# Patient Record
Sex: Female | Born: 1935
Health system: Southern US, Community
[De-identification: ages and names within clinical notes are randomized; demographics above are authoritative.]

## PROBLEM LIST (undated history)

## (undated) DIAGNOSIS — J309 Allergic rhinitis, unspecified: Secondary | ICD-10-CM

## (undated) DIAGNOSIS — R112 Nausea with vomiting, unspecified: Secondary | ICD-10-CM

## (undated) DIAGNOSIS — M8580 Other specified disorders of bone density and structure, unspecified site: Secondary | ICD-10-CM

## (undated) DIAGNOSIS — N390 Urinary tract infection, site not specified: Secondary | ICD-10-CM

## (undated) DIAGNOSIS — T7840XA Allergy, unspecified, initial encounter: Secondary | ICD-10-CM

## (undated) DIAGNOSIS — H269 Unspecified cataract: Secondary | ICD-10-CM

## (undated) DIAGNOSIS — D472 Monoclonal gammopathy: Secondary | ICD-10-CM

## (undated) DIAGNOSIS — R509 Fever, unspecified: Secondary | ICD-10-CM

## (undated) DIAGNOSIS — F419 Anxiety disorder, unspecified: Secondary | ICD-10-CM

## (undated) DIAGNOSIS — M51369 Other intervertebral disc degeneration, lumbar region without mention of lumbar back pain or lower extremity pain: Secondary | ICD-10-CM

## (undated) DIAGNOSIS — F331 Major depressive disorder, recurrent, moderate: Secondary | ICD-10-CM

## (undated) DIAGNOSIS — R296 Repeated falls: Secondary | ICD-10-CM

## (undated) DIAGNOSIS — C801 Malignant (primary) neoplasm, unspecified: Secondary | ICD-10-CM

## (undated) DIAGNOSIS — R197 Diarrhea, unspecified: Secondary | ICD-10-CM

## (undated) DIAGNOSIS — N3000 Acute cystitis without hematuria: Secondary | ICD-10-CM

## (undated) DIAGNOSIS — G2581 Restless legs syndrome: Secondary | ICD-10-CM

## (undated) DIAGNOSIS — Z87440 Personal history of urinary (tract) infections: Secondary | ICD-10-CM

## (undated) DIAGNOSIS — F0390 Unspecified dementia without behavioral disturbance: Secondary | ICD-10-CM

## (undated) DIAGNOSIS — E1129 Type 2 diabetes mellitus with other diabetic kidney complication: Secondary | ICD-10-CM

## (undated) DIAGNOSIS — K219 Gastro-esophageal reflux disease without esophagitis: Secondary | ICD-10-CM

## (undated) DIAGNOSIS — N3946 Mixed incontinence: Secondary | ICD-10-CM

## (undated) DIAGNOSIS — G43009 Migraine without aura, not intractable, without status migrainosus: Secondary | ICD-10-CM

## (undated) DIAGNOSIS — I10 Essential (primary) hypertension: Secondary | ICD-10-CM

## (undated) DIAGNOSIS — G4733 Obstructive sleep apnea (adult) (pediatric): Secondary | ICD-10-CM

## (undated) DIAGNOSIS — Z794 Long term (current) use of insulin: Secondary | ICD-10-CM

## (undated) DIAGNOSIS — M5136 Other intervertebral disc degeneration, lumbar region: Secondary | ICD-10-CM

## (undated) DIAGNOSIS — R4189 Other symptoms and signs involving cognitive functions and awareness: Secondary | ICD-10-CM

## (undated) DIAGNOSIS — E669 Obesity, unspecified: Secondary | ICD-10-CM

## (undated) DIAGNOSIS — B9620 Unspecified Escherichia coli [E. coli] as the cause of diseases classified elsewhere: Secondary | ICD-10-CM

## (undated) DIAGNOSIS — G609 Hereditary and idiopathic neuropathy, unspecified: Secondary | ICD-10-CM

## (undated) DIAGNOSIS — G4730 Sleep apnea, unspecified: Secondary | ICD-10-CM

## (undated) DIAGNOSIS — N183 Chronic kidney disease, stage 3 unspecified: Secondary | ICD-10-CM

## (undated) DIAGNOSIS — K529 Noninfective gastroenteritis and colitis, unspecified: Secondary | ICD-10-CM

## (undated) DIAGNOSIS — R42 Dizziness and giddiness: Secondary | ICD-10-CM

## (undated) HISTORY — DX: Urinary tract infection, site not specified: N39.0

## (undated) HISTORY — DX: Migraine without aura, not intractable, without status migrainosus: G43.009

## (undated) HISTORY — DX: Gastro-esophageal reflux disease without esophagitis: K21.9

## (undated) HISTORY — DX: Acute cystitis without hematuria: N30.00

## (undated) HISTORY — DX: Sleep apnea, unspecified: G47.30

## (undated) HISTORY — DX: Obstructive sleep apnea (adult) (pediatric): G47.33

## (undated) HISTORY — DX: Allergy, unspecified, initial encounter: T78.40XA

## (undated) HISTORY — DX: Other intervertebral disc degeneration, lumbar region: M51.36

## (undated) HISTORY — DX: Unspecified cataract: H26.9

## (undated) HISTORY — DX: Restless legs syndrome: G25.81

## (undated) HISTORY — DX: Personal history of urinary (tract) infections: Z87.440

## (undated) HISTORY — DX: Malignant (primary) neoplasm, unspecified: C80.1

## (undated) HISTORY — DX: Nausea with vomiting, unspecified: R11.2

## (undated) HISTORY — DX: Essential (primary) hypertension: I10

## (undated) HISTORY — DX: Other specified disorders of bone density and structure, unspecified site: M85.80

## (undated) HISTORY — PX: APPENDECTOMY: SHX54

## (undated) HISTORY — DX: Chronic kidney disease, stage 3 unspecified: N18.30

## (undated) HISTORY — DX: Fever, unspecified: R50.9

## (undated) HISTORY — DX: Monoclonal gammopathy: D47.2

## (undated) HISTORY — DX: Anxiety disorder, unspecified: F41.9

## (undated) HISTORY — DX: Dizziness and giddiness: R42

## (undated) HISTORY — DX: Long term (current) use of insulin: Z79.4

## (undated) HISTORY — DX: Urinary tract infection, site not specified: B96.20

## (undated) HISTORY — DX: Major depressive disorder, recurrent, moderate: F33.1

## (undated) HISTORY — PX: CHOLECYSTECTOMY: SHX55

## (undated) HISTORY — DX: Other intervertebral disc degeneration, lumbar region without mention of lumbar back pain or lower extremity pain: M51.369

## (undated) HISTORY — DX: Type 2 diabetes mellitus with other diabetic kidney complication: E11.29

## (undated) HISTORY — DX: Allergic rhinitis, unspecified: J30.9

## (undated) HISTORY — DX: Other symptoms and signs involving cognitive functions and awareness: R41.89

## (undated) HISTORY — DX: Hereditary and idiopathic neuropathy, unspecified: G60.9

## (undated) HISTORY — DX: Noninfective gastroenteritis and colitis, unspecified: K52.9

## (undated) HISTORY — DX: Obesity, unspecified: E66.9

## (undated) HISTORY — DX: Diarrhea, unspecified: R19.7

## (undated) HISTORY — DX: Mixed incontinence: N39.46

## (undated) HISTORY — DX: Repeated falls: R29.6

## (undated) HISTORY — PX: EYE SURGERY: SHX253

---
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
## 2019-09-22 ENCOUNTER — Other Ambulatory Visit: Payer: Self-pay

## 2019-09-22 ENCOUNTER — Encounter: Payer: Self-pay | Admitting: Nurse Practitioner

## 2019-09-22 ENCOUNTER — Ambulatory Visit (INDEPENDENT_AMBULATORY_CARE_PROVIDER_SITE_OTHER): Payer: Medicare Other | Admitting: Nurse Practitioner

## 2019-09-22 ENCOUNTER — Encounter (INDEPENDENT_AMBULATORY_CARE_PROVIDER_SITE_OTHER): Payer: Self-pay

## 2019-09-22 VITALS — BP 144/100 | HR 74 | Temp 97.7°F | Resp 18 | Wt 159.0 lb

## 2019-09-22 DIAGNOSIS — Z794 Long term (current) use of insulin: Secondary | ICD-10-CM | POA: Insufficient documentation

## 2019-09-22 DIAGNOSIS — D472 Monoclonal gammopathy: Secondary | ICD-10-CM | POA: Diagnosis not present

## 2019-09-22 DIAGNOSIS — M8580 Other specified disorders of bone density and structure, unspecified site: Secondary | ICD-10-CM

## 2019-09-22 DIAGNOSIS — F331 Major depressive disorder, recurrent, moderate: Secondary | ICD-10-CM | POA: Diagnosis not present

## 2019-09-22 DIAGNOSIS — Z87440 Personal history of urinary (tract) infections: Secondary | ICD-10-CM

## 2019-09-22 DIAGNOSIS — R296 Repeated falls: Secondary | ICD-10-CM | POA: Diagnosis not present

## 2019-09-22 DIAGNOSIS — N183 Chronic kidney disease, stage 3 unspecified: Secondary | ICD-10-CM | POA: Insufficient documentation

## 2019-09-22 DIAGNOSIS — E1129 Type 2 diabetes mellitus with other diabetic kidney complication: Secondary | ICD-10-CM | POA: Insufficient documentation

## 2019-09-22 DIAGNOSIS — F0391 Unspecified dementia with behavioral disturbance: Secondary | ICD-10-CM

## 2019-09-22 DIAGNOSIS — I10 Essential (primary) hypertension: Secondary | ICD-10-CM

## 2019-09-22 DIAGNOSIS — E1122 Type 2 diabetes mellitus with diabetic chronic kidney disease: Secondary | ICD-10-CM

## 2019-09-22 NOTE — Patient Instructions (Signed)
Needs to restart lisinopril 2.5 mg daily  Look in to her healthcare power of attorney and complete paperwork if needed Advance directive Would you want a Do Not Resuscitate order?  Please bring medication to office to confirm doses
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
## 2019-09-23 ENCOUNTER — Other Ambulatory Visit: Payer: Self-pay | Admitting: Nurse Practitioner

## 2019-09-23 ENCOUNTER — Telehealth: Payer: Self-pay

## 2019-09-23 DIAGNOSIS — I10 Essential (primary) hypertension: Secondary | ICD-10-CM

## 2019-09-23 DIAGNOSIS — F0391 Unspecified dementia with behavioral disturbance: Secondary | ICD-10-CM

## 2019-09-23 DIAGNOSIS — F331 Major depressive disorder, recurrent, moderate: Secondary | ICD-10-CM

## 2019-09-23 LAB — COMPLETE METABOLIC PANEL WITH GFR
AG Ratio: 1.4 (calc) (ref 1.0–2.5)
ALT: 6 U/L (ref 6–29)
AST: 15 U/L (ref 10–35)
Albumin: 3.7 g/dL (ref 3.6–5.1)
Alkaline phosphatase (APISO): 78 U/L (ref 37–153)
BUN/Creatinine Ratio: 15 (calc) (ref 6–22)
BUN: 20 mg/dL (ref 7–25)
CO2: 31 mmol/L (ref 20–32)
Calcium: 9.7 mg/dL (ref 8.6–10.4)
Chloride: 105 mmol/L (ref 98–110)
Creat: 1.33 mg/dL — ABNORMAL HIGH (ref 0.60–0.88)
GFR, Est African American: 41 mL/min/{1.73_m2} — ABNORMAL LOW (ref >=60)
GFR, Est Non African American: 35 mL/min/{1.73_m2} — ABNORMAL LOW (ref >=60)
Globulin: 2.7 g/dL (calc) (ref 1.9–3.7)
Glucose, Bld: 98 mg/dL (ref 65–139)
Potassium: 4.2 mmol/L (ref 3.5–5.3)
Sodium: 140 mmol/L (ref 135–146)
Total Bilirubin: 0.4 mg/dL (ref 0.2–1.2)
Total Protein: 6.4 g/dL (ref 6.1–8.1)

## 2019-09-23 LAB — CBC WITH DIFFERENTIAL/PLATELET
Absolute Monocytes: 574 cells/uL (ref 200–950)
Basophils Absolute: 28 cells/uL (ref 0–200)
Basophils Relative: 0.4 %
Eosinophils Absolute: 168 cells/uL (ref 15–500)
Eosinophils Relative: 2.4 %
HCT: 42.3 % (ref 35.0–45.0)
Hemoglobin: 13.9 g/dL (ref 11.7–15.5)
Lymphs Abs: 1890 cells/uL (ref 850–3900)
MCH: 30.4 pg (ref 27.0–33.0)
MCHC: 32.9 g/dL (ref 32.0–36.0)
MCV: 92.6 fL (ref 80.0–100.0)
MPV: 10.9 fL (ref 7.5–12.5)
Monocytes Relative: 8.2 %
Neutro Abs: 4340 cells/uL (ref 1500–7800)
Neutrophils Relative %: 62 %
Platelets: 199 10*3/uL (ref 140–400)
RBC: 4.57 10*6/uL (ref 3.80–5.10)
RDW: 11.8 % (ref 11.0–15.0)
Total Lymphocyte: 27 %
WBC: 7 10*3/uL (ref 3.8–10.8)

## 2019-09-23 LAB — HEMOGLOBIN A1C
Hgb A1c MFr Bld: 5 % of total Hgb (ref <5.7)
Mean Plasma Glucose: 97 (calc)
eAG (mmol/L): 5.4 (calc)

## 2019-09-23 MED ORDER — TRAZODONE HCL 50 MG PO TABS: 50.0000 mg | ORAL_TABLET | Freq: Every day | ORAL | 3 refills | Status: DC

## 2019-09-23 MED ORDER — LISINOPRIL 2.5 MG PO TABS: 2.5000 mg | ORAL_TABLET | Freq: Every day | ORAL | 3 refills | Status: DC

## 2019-09-23 MED ORDER — QUETIAPINE FUMARATE 25 MG PO TABS: 12.5000 mg | ORAL_TABLET | Freq: Every day | ORAL | 1 refills | Status: DC

## 2019-09-23 MED ORDER — DIVALPROEX SODIUM 125 MG PO CSDR: 125.0000 mg | DELAYED_RELEASE_CAPSULE | Freq: Two times a day (BID) | ORAL | 1 refills | Status: DC

## 2019-09-23 NOTE — Progress Notes (Signed)
Blood counts are normal Blood sugar looks great Kidney function has declined but she has this in her history.  Okay to start lisinopril 2.5 mg by mouth daily for high blood pressure back Will have her start trazodone 50 mg by mouth at bedtime to help with her mood (have her start with 1/2 tablet for 1 week then increase to whole).  To keep follow up in 1 month

## 2019-09-23 NOTE — Telephone Encounter (Signed)
Kelly Freeman, patients daughter stopped by the office to show me pill bottle/packaging to confirm current medication dose and instructions.  Kelly Freeman is asking for a refill on Depakote and Quetiapine for a 90 day supply to [facility] in [place].

## 2019-09-24 ENCOUNTER — Telehealth: Payer: Self-pay | Admitting: *Deleted

## 2019-09-24 NOTE — Telephone Encounter (Signed)
Received FL2 Form from daughter Haby Rourke requesting to be filled out for [facility] [phone] Fax: [phone])  Filled out and placed in [place] folder to review, fill out and sign.

## 2019-09-29 ENCOUNTER — Ambulatory Visit (INDEPENDENT_AMBULATORY_CARE_PROVIDER_SITE_OTHER): Payer: Medicare HMO | Admitting: *Deleted

## 2019-09-29 ENCOUNTER — Other Ambulatory Visit: Payer: Self-pay

## 2019-09-29 DIAGNOSIS — Z029 Encounter for administrative examinations, unspecified: Secondary | ICD-10-CM

## 2019-09-29 DIAGNOSIS — Z111 Encounter for screening for respiratory tuberculosis: Secondary | ICD-10-CM

## 2019-10-01 ENCOUNTER — Ambulatory Visit (INDEPENDENT_AMBULATORY_CARE_PROVIDER_SITE_OTHER): Payer: Medicare HMO

## 2019-10-01 ENCOUNTER — Other Ambulatory Visit: Payer: Self-pay

## 2019-10-01 VITALS — HR 60 | Temp 97.5°F

## 2019-10-01 DIAGNOSIS — Z111 Encounter for screening for respiratory tuberculosis: Secondary | ICD-10-CM | POA: Diagnosis not present

## 2019-10-01 LAB — TB SKIN TEST
Induration: 0 mm
TB Skin Test: NEGATIVE

## 2019-10-17 DIAGNOSIS — M545 Low back pain: Secondary | ICD-10-CM | POA: Diagnosis not present

## 2019-10-17 DIAGNOSIS — F329 Major depressive disorder, single episode, unspecified: Secondary | ICD-10-CM | POA: Diagnosis not present

## 2019-10-17 DIAGNOSIS — N183 Chronic kidney disease, stage 3 unspecified: Secondary | ICD-10-CM | POA: Diagnosis not present

## 2019-10-17 DIAGNOSIS — E119 Type 2 diabetes mellitus without complications: Secondary | ICD-10-CM | POA: Diagnosis not present

## 2019-10-17 DIAGNOSIS — F0390 Unspecified dementia without behavioral disturbance: Secondary | ICD-10-CM | POA: Diagnosis not present

## 2019-10-17 DIAGNOSIS — I10 Essential (primary) hypertension: Secondary | ICD-10-CM | POA: Diagnosis not present

## 2019-10-17 DIAGNOSIS — R32 Unspecified urinary incontinence: Secondary | ICD-10-CM | POA: Diagnosis not present

## 2019-10-20 ENCOUNTER — Ambulatory Visit: Payer: Medicare Other | Admitting: Nurse Practitioner

## 2019-10-24 DIAGNOSIS — N390 Urinary tract infection, site not specified: Secondary | ICD-10-CM | POA: Diagnosis not present

## 2019-10-31 DIAGNOSIS — F0390 Unspecified dementia without behavioral disturbance: Secondary | ICD-10-CM | POA: Diagnosis not present

## 2019-10-31 DIAGNOSIS — I10 Essential (primary) hypertension: Secondary | ICD-10-CM | POA: Diagnosis not present

## 2019-10-31 DIAGNOSIS — N3000 Acute cystitis without hematuria: Secondary | ICD-10-CM | POA: Diagnosis not present

## 2019-11-01 DIAGNOSIS — R269 Unspecified abnormalities of gait and mobility: Secondary | ICD-10-CM | POA: Diagnosis not present

## 2019-11-01 DIAGNOSIS — R2681 Unsteadiness on feet: Secondary | ICD-10-CM | POA: Diagnosis not present

## 2019-11-07 DIAGNOSIS — N3000 Acute cystitis without hematuria: Secondary | ICD-10-CM | POA: Diagnosis not present

## 2019-11-07 DIAGNOSIS — M25551 Pain in right hip: Secondary | ICD-10-CM | POA: Diagnosis not present

## 2019-11-07 DIAGNOSIS — S301XXA Contusion of abdominal wall, initial encounter: Secondary | ICD-10-CM | POA: Diagnosis not present

## 2019-11-07 DIAGNOSIS — M25552 Pain in left hip: Secondary | ICD-10-CM | POA: Diagnosis not present

## 2019-11-07 DIAGNOSIS — Z9181 History of falling: Secondary | ICD-10-CM | POA: Diagnosis not present

## 2019-11-07 DIAGNOSIS — N390 Urinary tract infection, site not specified: Secondary | ICD-10-CM | POA: Diagnosis not present

## 2019-11-07 DIAGNOSIS — R102 Pelvic and perineal pain: Secondary | ICD-10-CM | POA: Diagnosis not present

## 2019-11-07 DIAGNOSIS — R2681 Unsteadiness on feet: Secondary | ICD-10-CM | POA: Diagnosis not present

## 2019-11-11 DIAGNOSIS — M25559 Pain in unspecified hip: Secondary | ICD-10-CM | POA: Diagnosis not present

## 2019-11-13 DIAGNOSIS — E038 Other specified hypothyroidism: Secondary | ICD-10-CM | POA: Diagnosis not present

## 2019-11-13 DIAGNOSIS — Z79899 Other long term (current) drug therapy: Secondary | ICD-10-CM | POA: Diagnosis not present

## 2019-11-13 DIAGNOSIS — D518 Other vitamin B12 deficiency anemias: Secondary | ICD-10-CM | POA: Diagnosis not present

## 2019-11-13 DIAGNOSIS — E119 Type 2 diabetes mellitus without complications: Secondary | ICD-10-CM | POA: Diagnosis not present

## 2019-11-13 DIAGNOSIS — E559 Vitamin D deficiency, unspecified: Secondary | ICD-10-CM | POA: Diagnosis not present

## 2019-11-13 DIAGNOSIS — E7849 Other hyperlipidemia: Secondary | ICD-10-CM | POA: Diagnosis not present

## 2019-11-14 DIAGNOSIS — R600 Localized edema: Secondary | ICD-10-CM | POA: Diagnosis not present

## 2019-11-14 DIAGNOSIS — I10 Essential (primary) hypertension: Secondary | ICD-10-CM | POA: Diagnosis not present

## 2019-11-14 DIAGNOSIS — N183 Chronic kidney disease, stage 3 unspecified: Secondary | ICD-10-CM | POA: Diagnosis not present

## 2019-11-14 DIAGNOSIS — R2681 Unsteadiness on feet: Secondary | ICD-10-CM | POA: Diagnosis not present

## 2019-11-19 ENCOUNTER — Ambulatory Visit: Payer: Medicare HMO | Admitting: Nurse Practitioner

## 2019-12-05 DIAGNOSIS — R600 Localized edema: Secondary | ICD-10-CM | POA: Diagnosis not present

## 2019-12-05 DIAGNOSIS — I10 Essential (primary) hypertension: Secondary | ICD-10-CM | POA: Diagnosis not present

## 2019-12-08 DIAGNOSIS — R600 Localized edema: Secondary | ICD-10-CM | POA: Diagnosis not present

## 2019-12-09 DIAGNOSIS — F329 Major depressive disorder, single episode, unspecified: Secondary | ICD-10-CM | POA: Diagnosis not present

## 2019-12-09 DIAGNOSIS — E119 Type 2 diabetes mellitus without complications: Secondary | ICD-10-CM | POA: Diagnosis not present

## 2019-12-09 DIAGNOSIS — R600 Localized edema: Secondary | ICD-10-CM | POA: Diagnosis not present

## 2019-12-09 DIAGNOSIS — N183 Chronic kidney disease, stage 3 unspecified: Secondary | ICD-10-CM | POA: Diagnosis not present

## 2019-12-09 DIAGNOSIS — F0390 Unspecified dementia without behavioral disturbance: Secondary | ICD-10-CM | POA: Diagnosis not present

## 2019-12-09 DIAGNOSIS — I129 Hypertensive chronic kidney disease with stage 1 through stage 4 chronic kidney disease, or unspecified chronic kidney disease: Secondary | ICD-10-CM | POA: Diagnosis not present

## 2019-12-09 DIAGNOSIS — R2681 Unsteadiness on feet: Secondary | ICD-10-CM | POA: Diagnosis not present

## 2019-12-12 DIAGNOSIS — N183 Chronic kidney disease, stage 3 unspecified: Secondary | ICD-10-CM | POA: Diagnosis not present

## 2019-12-12 DIAGNOSIS — R600 Localized edema: Secondary | ICD-10-CM | POA: Diagnosis not present

## 2019-12-12 DIAGNOSIS — R2681 Unsteadiness on feet: Secondary | ICD-10-CM | POA: Diagnosis not present

## 2019-12-12 DIAGNOSIS — I10 Essential (primary) hypertension: Secondary | ICD-10-CM | POA: Diagnosis not present

## 2019-12-30 DIAGNOSIS — F0390 Unspecified dementia without behavioral disturbance: Secondary | ICD-10-CM | POA: Diagnosis not present

## 2019-12-30 DIAGNOSIS — N183 Chronic kidney disease, stage 3 unspecified: Secondary | ICD-10-CM | POA: Diagnosis not present

## 2019-12-30 DIAGNOSIS — R600 Localized edema: Secondary | ICD-10-CM | POA: Diagnosis not present

## 2019-12-30 DIAGNOSIS — F329 Major depressive disorder, single episode, unspecified: Secondary | ICD-10-CM | POA: Diagnosis not present

## 2019-12-30 DIAGNOSIS — E119 Type 2 diabetes mellitus without complications: Secondary | ICD-10-CM | POA: Diagnosis not present

## 2019-12-30 DIAGNOSIS — R2681 Unsteadiness on feet: Secondary | ICD-10-CM | POA: Diagnosis not present

## 2019-12-30 DIAGNOSIS — I129 Hypertensive chronic kidney disease with stage 1 through stage 4 chronic kidney disease, or unspecified chronic kidney disease: Secondary | ICD-10-CM | POA: Diagnosis not present

## 2020-01-02 DIAGNOSIS — Z20828 Contact with and (suspected) exposure to other viral communicable diseases: Secondary | ICD-10-CM | POA: Diagnosis not present

## 2020-01-02 DIAGNOSIS — U071 COVID-19: Secondary | ICD-10-CM | POA: Diagnosis not present

## 2020-01-05 DIAGNOSIS — S41119A Laceration without foreign body of unspecified upper arm, initial encounter: Secondary | ICD-10-CM | POA: Diagnosis not present

## 2020-01-05 DIAGNOSIS — F329 Major depressive disorder, single episode, unspecified: Secondary | ICD-10-CM | POA: Diagnosis not present

## 2020-01-05 DIAGNOSIS — R2681 Unsteadiness on feet: Secondary | ICD-10-CM | POA: Diagnosis not present

## 2020-01-23 DIAGNOSIS — F0390 Unspecified dementia without behavioral disturbance: Secondary | ICD-10-CM | POA: Diagnosis not present

## 2020-01-23 DIAGNOSIS — F419 Anxiety disorder, unspecified: Secondary | ICD-10-CM | POA: Diagnosis not present

## 2020-01-23 DIAGNOSIS — I10 Essential (primary) hypertension: Secondary | ICD-10-CM | POA: Diagnosis not present

## 2020-01-23 DIAGNOSIS — F329 Major depressive disorder, single episode, unspecified: Secondary | ICD-10-CM | POA: Diagnosis not present

## 2020-01-27 DIAGNOSIS — E119 Type 2 diabetes mellitus without complications: Secondary | ICD-10-CM | POA: Diagnosis not present

## 2020-01-27 DIAGNOSIS — R600 Localized edema: Secondary | ICD-10-CM | POA: Diagnosis not present

## 2020-01-27 DIAGNOSIS — F0390 Unspecified dementia without behavioral disturbance: Secondary | ICD-10-CM | POA: Diagnosis not present

## 2020-01-27 DIAGNOSIS — N183 Chronic kidney disease, stage 3 unspecified: Secondary | ICD-10-CM | POA: Diagnosis not present

## 2020-01-27 DIAGNOSIS — R2681 Unsteadiness on feet: Secondary | ICD-10-CM | POA: Diagnosis not present

## 2020-01-27 DIAGNOSIS — F329 Major depressive disorder, single episode, unspecified: Secondary | ICD-10-CM | POA: Diagnosis not present

## 2020-01-27 DIAGNOSIS — I129 Hypertensive chronic kidney disease with stage 1 through stage 4 chronic kidney disease, or unspecified chronic kidney disease: Secondary | ICD-10-CM | POA: Diagnosis not present

## 2020-02-04 DIAGNOSIS — Z79899 Other long term (current) drug therapy: Secondary | ICD-10-CM | POA: Diagnosis not present

## 2020-02-04 DIAGNOSIS — E559 Vitamin D deficiency, unspecified: Secondary | ICD-10-CM | POA: Diagnosis not present

## 2020-02-04 DIAGNOSIS — E038 Other specified hypothyroidism: Secondary | ICD-10-CM | POA: Diagnosis not present

## 2020-02-04 DIAGNOSIS — E119 Type 2 diabetes mellitus without complications: Secondary | ICD-10-CM | POA: Diagnosis not present

## 2020-02-04 DIAGNOSIS — D518 Other vitamin B12 deficiency anemias: Secondary | ICD-10-CM | POA: Diagnosis not present

## 2020-02-04 DIAGNOSIS — E7849 Other hyperlipidemia: Secondary | ICD-10-CM | POA: Diagnosis not present

## 2020-02-20 DIAGNOSIS — G4700 Insomnia, unspecified: Secondary | ICD-10-CM | POA: Diagnosis not present

## 2020-02-20 DIAGNOSIS — F329 Major depressive disorder, single episode, unspecified: Secondary | ICD-10-CM | POA: Diagnosis not present

## 2020-02-20 DIAGNOSIS — I10 Essential (primary) hypertension: Secondary | ICD-10-CM | POA: Diagnosis not present

## 2020-02-20 DIAGNOSIS — F0390 Unspecified dementia without behavioral disturbance: Secondary | ICD-10-CM | POA: Diagnosis not present

## 2020-02-24 DIAGNOSIS — F0390 Unspecified dementia without behavioral disturbance: Secondary | ICD-10-CM | POA: Diagnosis not present

## 2020-02-24 DIAGNOSIS — R600 Localized edema: Secondary | ICD-10-CM | POA: Diagnosis not present

## 2020-02-24 DIAGNOSIS — I129 Hypertensive chronic kidney disease with stage 1 through stage 4 chronic kidney disease, or unspecified chronic kidney disease: Secondary | ICD-10-CM | POA: Diagnosis not present

## 2020-02-24 DIAGNOSIS — Z20828 Contact with and (suspected) exposure to other viral communicable diseases: Secondary | ICD-10-CM | POA: Diagnosis not present

## 2020-02-24 DIAGNOSIS — N183 Chronic kidney disease, stage 3 unspecified: Secondary | ICD-10-CM | POA: Diagnosis not present

## 2020-02-24 DIAGNOSIS — U071 COVID-19: Secondary | ICD-10-CM | POA: Diagnosis not present

## 2020-03-02 DIAGNOSIS — E119 Type 2 diabetes mellitus without complications: Secondary | ICD-10-CM | POA: Diagnosis not present

## 2020-03-02 DIAGNOSIS — F329 Major depressive disorder, single episode, unspecified: Secondary | ICD-10-CM | POA: Diagnosis not present

## 2020-03-02 DIAGNOSIS — R600 Localized edema: Secondary | ICD-10-CM | POA: Diagnosis not present

## 2020-03-02 DIAGNOSIS — I129 Hypertensive chronic kidney disease with stage 1 through stage 4 chronic kidney disease, or unspecified chronic kidney disease: Secondary | ICD-10-CM | POA: Diagnosis not present

## 2020-03-02 DIAGNOSIS — R2681 Unsteadiness on feet: Secondary | ICD-10-CM | POA: Diagnosis not present

## 2020-03-02 DIAGNOSIS — F0390 Unspecified dementia without behavioral disturbance: Secondary | ICD-10-CM | POA: Diagnosis not present

## 2020-03-02 DIAGNOSIS — N183 Chronic kidney disease, stage 3 unspecified: Secondary | ICD-10-CM | POA: Diagnosis not present

## 2020-03-03 ENCOUNTER — Encounter (HOSPITAL_COMMUNITY): Payer: Self-pay | Admitting: Emergency Medicine

## 2020-03-03 ENCOUNTER — Other Ambulatory Visit: Payer: Self-pay

## 2020-03-03 ENCOUNTER — Emergency Department (HOSPITAL_COMMUNITY): Payer: PPO

## 2020-03-03 ENCOUNTER — Emergency Department (HOSPITAL_COMMUNITY)
Admission: EM | Admit: 2020-03-03 | Discharge: 2020-03-03 | Disposition: A | Discharge status: Home / Self Care | Payer: PPO | Attending: Emergency Medicine | Admitting: Emergency Medicine

## 2020-03-03 DIAGNOSIS — Y999 Unspecified external cause status: Secondary | ICD-10-CM | POA: Diagnosis not present

## 2020-03-03 DIAGNOSIS — S0083XA Contusion of other part of head, initial encounter: Secondary | ICD-10-CM | POA: Diagnosis not present

## 2020-03-03 DIAGNOSIS — R609 Edema, unspecified: Secondary | ICD-10-CM | POA: Diagnosis not present

## 2020-03-03 DIAGNOSIS — N183 Chronic kidney disease, stage 3 unspecified: Secondary | ICD-10-CM | POA: Diagnosis not present

## 2020-03-03 DIAGNOSIS — S300XXA Contusion of lower back and pelvis, initial encounter: Secondary | ICD-10-CM | POA: Diagnosis not present

## 2020-03-03 DIAGNOSIS — W19XXXA Unspecified fall, initial encounter: Secondary | ICD-10-CM

## 2020-03-03 DIAGNOSIS — R519 Headache, unspecified: Secondary | ICD-10-CM | POA: Insufficient documentation

## 2020-03-03 DIAGNOSIS — S199XXA Unspecified injury of neck, initial encounter: Secondary | ICD-10-CM | POA: Diagnosis not present

## 2020-03-03 DIAGNOSIS — R2243 Localized swelling, mass and lump, lower limb, bilateral: Secondary | ICD-10-CM | POA: Diagnosis not present

## 2020-03-03 DIAGNOSIS — Z794 Long term (current) use of insulin: Secondary | ICD-10-CM | POA: Insufficient documentation

## 2020-03-03 DIAGNOSIS — J3489 Other specified disorders of nose and nasal sinuses: Secondary | ICD-10-CM | POA: Diagnosis not present

## 2020-03-03 DIAGNOSIS — Y939 Activity, unspecified: Secondary | ICD-10-CM | POA: Insufficient documentation

## 2020-03-03 DIAGNOSIS — I129 Hypertensive chronic kidney disease with stage 1 through stage 4 chronic kidney disease, or unspecified chronic kidney disease: Secondary | ICD-10-CM | POA: Diagnosis not present

## 2020-03-03 DIAGNOSIS — I6523 Occlusion and stenosis of bilateral carotid arteries: Secondary | ICD-10-CM | POA: Diagnosis not present

## 2020-03-03 DIAGNOSIS — Z79899 Other long term (current) drug therapy: Secondary | ICD-10-CM | POA: Insufficient documentation

## 2020-03-03 DIAGNOSIS — Y929 Unspecified place or not applicable: Secondary | ICD-10-CM | POA: Diagnosis not present

## 2020-03-03 DIAGNOSIS — I10 Essential (primary) hypertension: Secondary | ICD-10-CM | POA: Diagnosis not present

## 2020-03-03 DIAGNOSIS — S0093XA Contusion of unspecified part of head, initial encounter: Secondary | ICD-10-CM | POA: Diagnosis not present

## 2020-03-03 DIAGNOSIS — R457 State of emotional shock and stress, unspecified: Secondary | ICD-10-CM | POA: Diagnosis not present

## 2020-03-03 DIAGNOSIS — R220 Localized swelling, mass and lump, head: Secondary | ICD-10-CM | POA: Diagnosis not present

## 2020-03-03 DIAGNOSIS — M4312 Spondylolisthesis, cervical region: Secondary | ICD-10-CM | POA: Diagnosis not present

## 2020-03-03 DIAGNOSIS — S7012XA Contusion of left thigh, initial encounter: Secondary | ICD-10-CM | POA: Diagnosis not present

## 2020-03-03 DIAGNOSIS — R600 Localized edema: Secondary | ICD-10-CM

## 2020-03-03 DIAGNOSIS — M160 Bilateral primary osteoarthritis of hip: Secondary | ICD-10-CM | POA: Diagnosis not present

## 2020-03-03 DIAGNOSIS — E1129 Type 2 diabetes mellitus with other diabetic kidney complication: Secondary | ICD-10-CM | POA: Diagnosis not present

## 2020-03-03 DIAGNOSIS — S0512XA Contusion of eyeball and orbital tissues, left eye, initial encounter: Secondary | ICD-10-CM | POA: Diagnosis not present

## 2020-03-03 DIAGNOSIS — M533 Sacrococcygeal disorders, not elsewhere classified: Secondary | ICD-10-CM | POA: Diagnosis not present

## 2020-03-03 DIAGNOSIS — M47812 Spondylosis without myelopathy or radiculopathy, cervical region: Secondary | ICD-10-CM | POA: Diagnosis not present

## 2020-03-03 DIAGNOSIS — S0993XA Unspecified injury of face, initial encounter: Secondary | ICD-10-CM | POA: Diagnosis present

## 2020-03-03 DIAGNOSIS — I7090 Unspecified atherosclerosis: Secondary | ICD-10-CM | POA: Diagnosis not present

## 2020-03-03 LAB — COMPREHENSIVE METABOLIC PANEL
ALT: 11 U/L (ref 0–44)
AST: 16 U/L (ref 15–41)
Albumin: 3.1 g/dL — ABNORMAL LOW (ref 3.5–5.0)
Alkaline Phosphatase: 86 U/L (ref 38–126)
Anion gap: 7 (ref 5–15)
BUN: 37 mg/dL — ABNORMAL HIGH (ref 8–23)
CO2: 26 mmol/L (ref 22–32)
Calcium: 8.8 mg/dL — ABNORMAL LOW (ref 8.9–10.3)
Chloride: 104 mmol/L (ref 98–111)
Creatinine, Ser: 1.2 mg/dL — ABNORMAL HIGH (ref 0.44–1.00)
GFR calc Af Amer: 46 mL/min — ABNORMAL LOW (ref >60)
GFR calc non Af Amer: 40 mL/min — ABNORMAL LOW (ref >60)
Glucose, Bld: 94 mg/dL (ref 70–99)
Potassium: 4.1 mmol/L (ref 3.5–5.1)
Sodium: 137 mmol/L (ref 135–145)
Total Bilirubin: 0.6 mg/dL (ref 0.3–1.2)
Total Protein: 6 g/dL — ABNORMAL LOW (ref 6.5–8.1)

## 2020-03-03 LAB — CBC WITH DIFFERENTIAL/PLATELET
Abs Immature Granulocytes: 0.03 10*3/uL (ref 0.00–0.07)
Basophils Absolute: 0 10*3/uL (ref 0.0–0.1)
Basophils Relative: 0 %
Eosinophils Absolute: 0.2 10*3/uL (ref 0.0–0.5)
Eosinophils Relative: 2 %
HCT: 34.3 % — ABNORMAL LOW (ref 36.0–46.0)
Hemoglobin: 10.9 g/dL — ABNORMAL LOW (ref 12.0–15.0)
Immature Granulocytes: 0 %
Lymphocytes Relative: 16 %
Lymphs Abs: 1.3 10*3/uL (ref 0.7–4.0)
MCH: 29.7 pg (ref 26.0–34.0)
MCHC: 31.8 g/dL (ref 30.0–36.0)
MCV: 93.5 fL (ref 80.0–100.0)
Monocytes Absolute: 0.9 10*3/uL (ref 0.1–1.0)
Monocytes Relative: 11 %
Neutro Abs: 6 10*3/uL (ref 1.7–7.7)
Neutrophils Relative %: 71 %
Platelets: 158 10*3/uL (ref 150–400)
RBC: 3.67 MIL/uL — ABNORMAL LOW (ref 3.87–5.11)
RDW: 13 % (ref 11.5–15.5)
WBC: 8.5 10*3/uL (ref 4.0–10.5)
nRBC: 0 % (ref 0.0–0.2)

## 2020-03-03 LAB — URINALYSIS, ROUTINE W REFLEX MICROSCOPIC
Bilirubin Urine: NEGATIVE
Glucose, UA: NEGATIVE mg/dL
Hgb urine dipstick: NEGATIVE
Ketones, ur: NEGATIVE mg/dL
Leukocytes,Ua: NEGATIVE
Nitrite: NEGATIVE
Protein, ur: NEGATIVE mg/dL
Specific Gravity, Urine: 1.01 (ref 1.005–1.030)
pH: 7 (ref 5.0–8.0)

## 2020-03-03 LAB — POC OCCULT BLOOD, ED: Fecal Occult Bld: NEGATIVE

## 2020-03-03 NOTE — Discharge Instructions (Addendum)
You have been seen here for a fall.  Labs and imaging look reassuring.  I recommend that you continue to wrap your legs and elevate them as this will help with swelling.  I also recommend that you continue to take your antibiotics as prescribed to you.  I want you to follow-up with your primary care provider for reevaluation in 3 days to ensure that the leg swelling has improved.  Seen on CT lung nodule within the right lung apex measuring at least 6 mm please follow-up with a nonemergent chest CT for further evaluation.  I want to come back to the emergency department if you develop fever, chills, worsening swelling the legs, will increase redness, increased pain, discharge, chest pain, shortness of breath, uncontrolled nausea, vomiting, diarrhea as he symptoms require further evaluation and management.

## 2020-03-03 NOTE — ED Triage Notes (Signed)
Pt fell in the bathroom. Witnessed by a roommate. Denies loc or being altered. Bruising to left side of face  BP 210/120

## 2020-03-03 NOTE — ED Provider Notes (Signed)
[facility] EMERGENCY DEPARTMENT Provider Note   CSN: [id number] Arrival date & time: 03/03/20  1055     History Chief Complaint  Patient presents with  . Fall    Kelly Freeman is a 86 y.o. female.  HPI HPI will be deferred due to level 5 caveat of dementia.    Patient presents to the emergency department via EMS from [facility] due to fall.  [name] was at bedside and HPI was collected from her.  She states that her mom is at a memory care facility and she explains that another resident was trying to help her to the bathroom and she fell.  She states it was a unwitnessed fall unclear how she fell or if she lost consciousness.  [name] explained that she is not on any blood thinners, patient is at baseline cognitively.  Patient states her right leg is bothering her and she has a headache.  She does not remember what happened or how she fell.  Patient has a medical history of diabetes, depression, hypertension, cognitive impairment.   [facility] was contacted spoke with nursing staff who explained that patient had a unwitnessed fall.  She confirms that patient was being helped by another resident and she fell.  Unsure if she lost consciousness, hit her head.  She confirmed that patient is not on any blood thinners and got her usual morning medications.    Past Medical History:  Diagnosis Date  . Acute cystitis without hematuria   . Acute diarrhea   . Chronic allergic rhinitis   . CKD (chronic kidney disease) stage 3, GFR 30-59 ml/min   . Cognitive impairment   . DDD (degenerative disc disease), lumbar   . Dizziness   . E. coli UTI   . Fever   . Gastroenteritis   . Hereditary and idiopathic peripheral neuropathy   . History of recurrent UTIs   . Hypertension   . Hypertension   . MGUS (monoclonal gammopathy of unknown significance)   . Migraine without aura and without status migrainosus, not intractable   . Mixed incontinence urge  and stress   . Moderate episode of recurrent major depressive disorder ([place])   . Nausea and vomiting   . Obesity   . Obstructive sleep apnea   . Osteopenia   . Recurrent falls   . Restless leg syndrome   . Type 2 diabetes mellitus with kidney complication, with long-term current use of insulin ([address])   . UTI (urinary tract infection)     Patient Active Problem List   Diagnosis Date Noted  . Type 2 diabetes mellitus with kidney complication, with long-term current use of insulin ([place])   . Recurrent falls   . Moderate episode of recurrent major depressive disorder ([place])   . MGUS (monoclonal gammopathy of unknown significance)   . Hypertension   . History of recurrent UTIs   . CKD (chronic kidney disease) stage 3, GFR 30-59 ml/min     Past Surgical History:  Procedure Laterality Date  . APPENDECTOMY    . CHOLECYSTECTOMY       OB History   No obstetric history on file.     Family History  Problem Relation Age of Onset  . Thrombosis Father 82       Coronary  . Stomach cancer Mother 63  . Arthritis Son 39  . Arthritis Daughter 16  . Cirrhosis Daughter 69       Stage 3   . Hepatitis C  Daughter     Social History   Tobacco Use  . Smoking status: Never Smoker  . Smokeless tobacco: Never Used  Vaping Use  . Vaping Use: Never used  Substance Use Topics  . Alcohol use: Never  . Drug use: Never    Home Medications Prior to Admission medications   Medication Sig Start Date End Date Taking? Authorizing Provider  acetaminophen (TYLENOL) 650 MG CR tablet Take 650 mg by mouth every 8 (eight) hours as needed for pain.   Yes [provider]  Cephalexin (KEFLEX PO) Take by mouth. Has not started   Yes [provider]  divalproex (DEPAKOTE SPRINKLE) 125 MG capsule Take 1 capsule (125 mg total) by mouth 2 (two) times daily. 09/23/19  Yes Lauree Chandler, NP  FUROSEMIDE PO Take by mouth. Patient hasn't started yet   Yes [provider]  lisinopril  (ZESTRIL) 2.5 MG tablet Take 1 tablet (2.5 mg total) by mouth daily. 09/23/19  Yes Lauree Chandler, NP  Melatonin 5 MG TABS Take 5 mg by mouth daily.   Yes [provider]  QUEtiapine (SEROQUEL) 25 MG tablet Take 0.5 tablets (12.5 mg total) by mouth daily. 09/23/19  Yes Lauree Chandler, NP  traZODone (DESYREL) 50 MG tablet Take 1 tablet (50 mg total) by mouth at bedtime. 09/23/19  Yes Lauree Chandler, NP    Allergies    Levofloxacin and Gabapentin  Review of Systems   Review of Systems  Constitutional: Negative for chills and fever.  HENT: Negative for congestion and sore throat.   Eyes: Negative for pain.  Respiratory: Negative for shortness of breath.   Cardiovascular: Positive for leg swelling. Negative for chest pain.  Gastrointestinal: Negative for abdominal pain, diarrhea, nausea and vomiting.  Genitourinary: Negative for dysuria, enuresis and flank pain.  Musculoskeletal: Negative for back pain.       Admits to right leg pain.  Skin: Negative for rash.  Neurological: Positive for headaches. Negative for dizziness, light-headedness and numbness.  Hematological: Does not bruise/bleed easily.    Physical Exam Updated Vital Signs BP (!) 157/60   Pulse 64   Temp 98.8 F (37.1 C) (Oral)   Resp 18   Wt 72.1 kg   SpO2 97%   Physical Exam Vitals and nursing note reviewed.  Constitutional:      General: She is not in acute distress.    Appearance: She is not ill-appearing.  HENT:     Head: Normocephalic and atraumatic.     Comments: Patient's face has ecchymosis on the left side from the maxilla up to her temporal lobe.  Eyes were PERRLA, EOMs intact, no CSF coming from ears or nose, no battle signs noted.    Nose: No congestion.     Mouth/Throat:     Mouth: Mucous membranes are moist.     Pharynx: Oropharynx is clear.  Eyes:     General: No scleral icterus.    Extraocular Movements: Extraocular movements intact.     Pupils: Pupils are equal, round, and  reactive to light.  Cardiovascular:     Rate and Rhythm: Normal rate and regular rhythm.     Pulses: Normal pulses.     Heart sounds: No murmur heard.  No friction rub. No gallop.   Pulmonary:     Effort: No respiratory distress.     Breath sounds: No wheezing, rhonchi or rales.  Abdominal:     General: There is no distension.     Tenderness:  There is no abdominal tenderness. There is no guarding.  Musculoskeletal:        General: No swelling.     Comments: Patient's legs were visualized bilateral swelling +2 edema up to the shins.  Legs are erythematous, notable drainage of clear solution, no pustulant discharge noted.  Tender to palpation, good pedal pulses, good capillary refill refill, warm to the touch.  Skin:    General: Skin is warm and dry.     Capillary Refill: Capillary refill takes less than 2 seconds.     Findings: No rash.  Neurological:     General: No focal deficit present.     Mental Status: She is alert. Mental status is at baseline.     Cranial Nerves: No cranial nerve deficit.     Motor: No weakness.  Psychiatric:        Mood and Affect: Mood normal.     ED Results / Procedures / Treatments   Labs (all labs ordered are listed, but only abnormal results are displayed) Labs Reviewed  COMPREHENSIVE METABOLIC PANEL - Abnormal; Notable for the following components:      Result Value   BUN 37 (*)    Creatinine, Ser 1.20 (*)    Calcium 8.8 (*)    Total Protein 6.0 (*)    Albumin 3.1 (*)    GFR calc non Af Amer 40 (*)    GFR calc Af Amer 46 (*)    All other components within normal limits  CBC WITH DIFFERENTIAL/PLATELET - Abnormal; Notable for the following components:   RBC 3.67 (*)    Hemoglobin 10.9 (*)    HCT 34.3 (*)    All other components within normal limits  URINALYSIS, ROUTINE W REFLEX MICROSCOPIC - Abnormal; Notable for the following components:   Color, Urine STRAW (*)    All other components within normal limits  OCCULT BLOOD X 1 CARD TO LAB,  STOOL  POC OCCULT BLOOD, ED    EKG None  Radiology DG Pelvis 1-2 Views  Result Date: 03/03/2020 CLINICAL DATA:  86 year old who fell onto her LEFT side and has bruising and pain involving the pelvis and LEFT leg. Initial encounter. EXAM: PELVIS - 1-2 VIEW COMPARISON:  None. FINDINGS: No evidence of acute fracture. Hip joints anatomically aligned with severe joint space narrowing bilaterally. Sacroiliac joints and symphysis pubis anatomically aligned without diastasis, demonstrating mild degenerative changes. Urinary device and tubing overlies the pelvis. IMPRESSION: 1. No acute osseous abnormality. 2. Severe osteoarthritis involving both hips. Electronically Signed   By: Evangeline Dakin M.D.   On: 03/03/2020 15:01   CT Head Wo Contrast  Result Date: 03/03/2020 CLINICAL DATA:  Head trauma, headache. EXAM: CT HEAD WITHOUT CONTRAST TECHNIQUE: Contiguous axial images were obtained from the base of the skull through the vertex without intravenous contrast. COMPARISON:  No pertinent prior studies available for comparison. FINDINGS: Brain: Moderate generalized parenchymal atrophy. 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 Moderate patchy hypoattenuation within the cerebral white matter is nonspecific, but consistent with chronic small vessel ischemic disease. There is no acute intracranial hemorrhage. No acute demarcated cortical infarct is identified. No extra-axial fluid collection. No evidence of intracranial mass. No midline shift. Vascular: No hyperdense vessel.  Atherosclerotic calcifications. Skull: Normal. Negative for fracture or focal lesion. Sinuses/Orbits: Left periorbital soft tissue hematoma. Otherwise, no evidence of acute orbital abnormality. Mild ethmoid sinus mucosal thickening at the imaged levels. Other: Sizable left frontotemporal scalp/left periorbital hematoma. IMPRESSION: No evidence of acute intracranial abnormality. Sizable  left frontotemporal scalp/left periorbital hematoma.  Moderate generalized parenchymal atrophy and chronic small vessel ischemic disease. 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 Mild ethmoid sinus mucosal thickening at the imaged levels. Electronically Signed   By: Kellie Simmering DO   On: 03/03/2020 14:48   CT Cervical Spine Wo Contrast  Result Date: 03/03/2020 CLINICAL DATA:  Head trauma, headache. EXAM: CT CERVICAL SPINE WITHOUT CONTRAST TECHNIQUE: Multidetector CT imaging of the cervical spine was performed without intravenous contrast. Multiplanar CT image reconstructions were also generated. COMPARISON:  Concurrently performed maxillofacial CT. FINDINGS: Alignment: C3-C4, C4-C5 and C5-C6 grade 1 anterolisthesis. Skull base and vertebrae: The basion-dental and atlanto-dental intervals are maintained.No evidence of acute fracture to the cervical spine. Congenital nonunion of the posterior arch of C1. Soft tissues and spinal canal: No prevertebral fluid or swelling. No visible canal hematoma. Atherosclerotic calcifications within the carotid arteries bilaterally. Disc levels: Cervical spondylosis without high-grade bony spinal canal narrowing. Uncovertebral hypertrophy contributes to multilevel bony neural foraminal narrowing greatest bilaterally at C5-C6 and C6-C7. Upper chest: No consolidation within the imaged lung apices. No visible pneumothorax. Partially imaged lung nodule within the right lung apex measuring at least 6 mm. IMPRESSION: No evidence of acute fracture to the cervical spine. C3-C4, C4-C5 and C5-C6 grade 1 anterolisthesis. Cervical spondylosis as described. Incompletely imaged lung nodule within the right lung apex measuring at least 6 mm. Nonemergent chest CT is recommended for further evaluation. Electronically Signed   By: Kellie Simmering DO   On: 03/03/2020 14:56   DG FEMUR MIN 2 VIEWS LEFT  Result Date: 03/03/2020 CLINICAL DATA:  86 year old who fell onto her LEFT side and has bruising and pain involving the pelvis and LEFT leg.  Initial encounter. EXAM: LEFT FEMUR 2 VIEWS COMPARISON:  None. FINDINGS: No evidence of acute fracture. Bone mineral density well preserved for patient age. Mild MEDIAL compartment joint space narrowing involving the knee. Femoropopliteal atherosclerosis. IMPRESSION: 1. No acute osseous abnormality. 2. Mild MEDIAL compartment osteoarthritis involving the knee. Electronically Signed   By: Evangeline Dakin M.D.   On: 03/03/2020 14:59   CT Maxillofacial Wo Contrast  Result Date: 03/03/2020 CLINICAL DATA:  Head trauma, headache.  Additional provided: Fall. EXAM: CT MAXILLOFACIAL WITHOUT CONTRAST TECHNIQUE: Multidetector CT imaging of the maxillofacial structures was performed. Multiplanar CT image reconstructions were also generated. COMPARISON:  Concurrently performed CT of the head and maxillofacial structures. FINDINGS: Osseous: No acute maxillofacial fracture is identified. Orbits: There is prominent left frontotemporal/periorbital soft tissue swelling/hematoma. The globes are normal in size and contour. The extraocular muscles are symmetric and unremarkable. Sinuses: Mild ethmoid sinus mucosal thickening. No significant mastoid effusion. Soft tissues: Prominent left frontotemporal/periorbital soft tissue swelling/hematoma. Limited intracranial: Intracranial contents better evaluated on concurrently performed noncontrast head CT. IMPRESSION: No evidence of acute maxillofacial fracture. Prominent left frontotemporal/periorbital soft tissue swelling/hematoma. Mild ethmoid sinus mucosal thickening. Electronically Signed   By: Kellie Simmering DO   On: 03/03/2020 15:05    Procedures Procedures (including critical care time)  Medications Ordered in ED Medications - No data to display  ED Course  I have reviewed the triage vital signs and the nursing notes.  Pertinent labs & imaging results that were available during my care of the patient were reviewed by me and considered in my medical decision making (see  chart for details).    MDM Rules/Calculators/A&P  I have personally reviewed all imaging, labs and have interpreted them.  Due to patient's complaint most concern for intracranial bleed versus fracture versus systemic infection versus electrolyte abnormality.  Unlikely patient is suffering from intracranial bleed or fracture of the skull or C-spine as CT head, C-spine, maxillofacial did not show any acute abnormalities.  CT did show a small lung nodule within the right lung apex measuring at least 6 mm recommend nonemergent chest CT for further evaluation.  Patient's legal guardian was notified of findings. X-ray of pelvis and left femur did not show any acute abnormalities Unlikely patient suffering from a systemic infection as patient is nontoxic-appearing, vital signs reassuring, CBC does not show leukocytosis, lung sounds clear bilaterally, patient denies chest pain, cough, UA does not show nitrates or leukocytes making a UTI unlikely.  Unlikely patient is suffering from an electrolyte abnormality CMP did not show any electrolyte abnormalities. bun and creatinine appear to be at baseline for patient.  Patient CBC did showed normocytic anemia, patient's occult was negative, physical exam of abdomen was benign no acute abdomen seen, nontender abdomen making GI bleed unlikely.  Patient's legs were swollen and she was started on antibiotics Keflex, will not change antibiotics but wwill wrap legs to help with swelling.   Patient appears to be resting comfortably in bed, showing no acute signs distress. vital Signs have remained stable does not meet criteria to be admitted to the hospital.  Likely patient suffered a mechanism fall and suffered a contusion on the left anterior side of her face.  Recommend patient follows up with her PCP for reevaluation in 2 to 3 weeks as well as obtain additional imaging for her lung nodule.  Patient was discussed with attending who agrees with  assessment and plan.  Patient was given at home instruction as well as strict return precautions.  Patient's guardian was notified and agrees with assessment and plan. Final Clinical Impression(s) / ED Diagnoses Final diagnoses:  Fall, initial encounter  Contusion of face, initial encounter  Bilateral edema of lower extremity    Rx / DC Orders ED Discharge Orders    None       Marcello Fennel, PA-C 03/03/20 Lennie Hummer, MD 03/05/20 [phone]

## 2020-03-05 DIAGNOSIS — R600 Localized edema: Secondary | ICD-10-CM | POA: Diagnosis not present

## 2020-03-05 DIAGNOSIS — L98499 Non-pressure chronic ulcer of skin of other sites with unspecified severity: Secondary | ICD-10-CM | POA: Diagnosis not present

## 2020-03-05 DIAGNOSIS — I872 Venous insufficiency (chronic) (peripheral): Secondary | ICD-10-CM | POA: Diagnosis not present

## 2020-03-05 DIAGNOSIS — L97811 Non-pressure chronic ulcer of other part of right lower leg limited to breakdown of skin: Secondary | ICD-10-CM | POA: Diagnosis not present

## 2020-03-05 DIAGNOSIS — M2041 Other hammer toe(s) (acquired), right foot: Secondary | ICD-10-CM | POA: Diagnosis not present

## 2020-03-05 DIAGNOSIS — N183 Chronic kidney disease, stage 3 unspecified: Secondary | ICD-10-CM | POA: Diagnosis not present

## 2020-03-05 DIAGNOSIS — B351 Tinea unguium: Secondary | ICD-10-CM | POA: Diagnosis not present

## 2020-03-05 DIAGNOSIS — L0390 Cellulitis, unspecified: Secondary | ICD-10-CM | POA: Diagnosis not present

## 2020-03-07 DIAGNOSIS — L97509 Non-pressure chronic ulcer of other part of unspecified foot with unspecified severity: Secondary | ICD-10-CM | POA: Diagnosis not present

## 2020-03-07 DIAGNOSIS — I872 Venous insufficiency (chronic) (peripheral): Secondary | ICD-10-CM | POA: Diagnosis not present

## 2020-03-07 DIAGNOSIS — L98499 Non-pressure chronic ulcer of skin of other sites with unspecified severity: Secondary | ICD-10-CM | POA: Diagnosis not present

## 2020-03-12 DIAGNOSIS — L0390 Cellulitis, unspecified: Secondary | ICD-10-CM | POA: Diagnosis not present

## 2020-03-12 DIAGNOSIS — I872 Venous insufficiency (chronic) (peripheral): Secondary | ICD-10-CM | POA: Diagnosis not present

## 2020-03-12 DIAGNOSIS — L98499 Non-pressure chronic ulcer of skin of other sites with unspecified severity: Secondary | ICD-10-CM | POA: Diagnosis not present

## 2020-03-12 DIAGNOSIS — R600 Localized edema: Secondary | ICD-10-CM | POA: Diagnosis not present

## 2020-03-15 DIAGNOSIS — Z79899 Other long term (current) drug therapy: Secondary | ICD-10-CM | POA: Diagnosis not present

## 2020-03-15 DIAGNOSIS — E119 Type 2 diabetes mellitus without complications: Secondary | ICD-10-CM | POA: Diagnosis not present

## 2020-03-15 DIAGNOSIS — D518 Other vitamin B12 deficiency anemias: Secondary | ICD-10-CM | POA: Diagnosis not present

## 2020-03-15 DIAGNOSIS — E7849 Other hyperlipidemia: Secondary | ICD-10-CM | POA: Diagnosis not present

## 2020-03-17 DIAGNOSIS — F4320 Adjustment disorder, unspecified: Secondary | ICD-10-CM | POA: Diagnosis not present

## 2020-03-19 DIAGNOSIS — F0390 Unspecified dementia without behavioral disturbance: Secondary | ICD-10-CM | POA: Diagnosis not present

## 2020-03-19 DIAGNOSIS — L98499 Non-pressure chronic ulcer of skin of other sites with unspecified severity: Secondary | ICD-10-CM | POA: Diagnosis not present

## 2020-03-19 DIAGNOSIS — N179 Acute kidney failure, unspecified: Secondary | ICD-10-CM | POA: Diagnosis not present

## 2020-03-19 DIAGNOSIS — N189 Chronic kidney disease, unspecified: Secondary | ICD-10-CM | POA: Diagnosis not present

## 2020-03-22 DIAGNOSIS — R011 Cardiac murmur, unspecified: Secondary | ICD-10-CM | POA: Diagnosis not present

## 2020-03-23 DIAGNOSIS — I714 Abdominal aortic aneurysm, without rupture: Secondary | ICD-10-CM | POA: Diagnosis not present

## 2020-03-23 DIAGNOSIS — I700 Atherosclerosis of aorta: Secondary | ICD-10-CM | POA: Diagnosis not present

## 2020-03-23 DIAGNOSIS — R221 Localized swelling, mass and lump, neck: Secondary | ICD-10-CM | POA: Diagnosis not present

## 2020-03-23 DIAGNOSIS — E042 Nontoxic multinodular goiter: Secondary | ICD-10-CM | POA: Diagnosis not present

## 2020-03-29 DIAGNOSIS — F0390 Unspecified dementia without behavioral disturbance: Secondary | ICD-10-CM | POA: Diagnosis not present

## 2020-03-29 DIAGNOSIS — F329 Major depressive disorder, single episode, unspecified: Secondary | ICD-10-CM | POA: Diagnosis not present

## 2020-03-29 DIAGNOSIS — R600 Localized edema: Secondary | ICD-10-CM | POA: Diagnosis not present

## 2020-03-29 DIAGNOSIS — I129 Hypertensive chronic kidney disease with stage 1 through stage 4 chronic kidney disease, or unspecified chronic kidney disease: Secondary | ICD-10-CM | POA: Diagnosis not present

## 2020-03-29 DIAGNOSIS — E119 Type 2 diabetes mellitus without complications: Secondary | ICD-10-CM | POA: Diagnosis not present

## 2020-03-29 DIAGNOSIS — N183 Chronic kidney disease, stage 3 unspecified: Secondary | ICD-10-CM | POA: Diagnosis not present

## 2020-03-29 DIAGNOSIS — R2681 Unsteadiness on feet: Secondary | ICD-10-CM | POA: Diagnosis not present

## 2020-03-31 DIAGNOSIS — I82401 Acute embolism and thrombosis of unspecified deep veins of right lower extremity: Secondary | ICD-10-CM | POA: Insufficient documentation

## 2020-03-31 DIAGNOSIS — E1122 Type 2 diabetes mellitus with diabetic chronic kidney disease: Secondary | ICD-10-CM | POA: Diagnosis not present

## 2020-03-31 DIAGNOSIS — F329 Major depressive disorder, single episode, unspecified: Secondary | ICD-10-CM | POA: Diagnosis not present

## 2020-03-31 DIAGNOSIS — R600 Localized edema: Secondary | ICD-10-CM | POA: Diagnosis not present

## 2020-03-31 DIAGNOSIS — Z7901 Long term (current) use of anticoagulants: Secondary | ICD-10-CM | POA: Diagnosis not present

## 2020-03-31 DIAGNOSIS — N183 Chronic kidney disease, stage 3 unspecified: Secondary | ICD-10-CM | POA: Diagnosis not present

## 2020-03-31 DIAGNOSIS — Z9049 Acquired absence of other specified parts of digestive tract: Secondary | ICD-10-CM | POA: Diagnosis not present

## 2020-03-31 DIAGNOSIS — Z87828 Personal history of other (healed) physical injury and trauma: Secondary | ICD-10-CM | POA: Diagnosis not present

## 2020-03-31 DIAGNOSIS — Z79899 Other long term (current) drug therapy: Secondary | ICD-10-CM | POA: Diagnosis not present

## 2020-03-31 DIAGNOSIS — I129 Hypertensive chronic kidney disease with stage 1 through stage 4 chronic kidney disease, or unspecified chronic kidney disease: Secondary | ICD-10-CM | POA: Diagnosis not present

## 2020-04-07 DIAGNOSIS — I890 Lymphedema, not elsewhere classified: Secondary | ICD-10-CM | POA: Diagnosis not present

## 2020-04-07 DIAGNOSIS — R600 Localized edema: Secondary | ICD-10-CM | POA: Diagnosis not present

## 2020-04-16 DIAGNOSIS — N189 Chronic kidney disease, unspecified: Secondary | ICD-10-CM | POA: Diagnosis not present

## 2020-04-16 DIAGNOSIS — L98499 Non-pressure chronic ulcer of skin of other sites with unspecified severity: Secondary | ICD-10-CM | POA: Diagnosis not present

## 2020-04-16 DIAGNOSIS — R600 Localized edema: Secondary | ICD-10-CM | POA: Diagnosis not present

## 2020-04-16 DIAGNOSIS — I890 Lymphedema, not elsewhere classified: Secondary | ICD-10-CM | POA: Diagnosis not present

## 2020-04-16 DIAGNOSIS — N179 Acute kidney failure, unspecified: Secondary | ICD-10-CM | POA: Diagnosis not present

## 2020-04-22 DIAGNOSIS — Z79899 Other long term (current) drug therapy: Secondary | ICD-10-CM | POA: Diagnosis not present

## 2020-04-22 DIAGNOSIS — D518 Other vitamin B12 deficiency anemias: Secondary | ICD-10-CM | POA: Diagnosis not present

## 2020-04-22 DIAGNOSIS — E7849 Other hyperlipidemia: Secondary | ICD-10-CM | POA: Diagnosis not present

## 2020-04-22 DIAGNOSIS — E119 Type 2 diabetes mellitus without complications: Secondary | ICD-10-CM | POA: Diagnosis not present

## 2020-04-26 DIAGNOSIS — F0391 Unspecified dementia with behavioral disturbance: Secondary | ICD-10-CM | POA: Diagnosis not present

## 2020-04-26 DIAGNOSIS — F419 Anxiety disorder, unspecified: Secondary | ICD-10-CM | POA: Diagnosis not present

## 2020-04-30 DIAGNOSIS — N390 Urinary tract infection, site not specified: Secondary | ICD-10-CM | POA: Diagnosis not present

## 2020-05-06 DIAGNOSIS — Z79899 Other long term (current) drug therapy: Secondary | ICD-10-CM | POA: Diagnosis not present

## 2020-05-06 DIAGNOSIS — D518 Other vitamin B12 deficiency anemias: Secondary | ICD-10-CM | POA: Diagnosis not present

## 2020-05-06 DIAGNOSIS — E7849 Other hyperlipidemia: Secondary | ICD-10-CM | POA: Diagnosis not present

## 2020-05-06 DIAGNOSIS — E119 Type 2 diabetes mellitus without complications: Secondary | ICD-10-CM | POA: Diagnosis not present

## 2020-05-10 DIAGNOSIS — M6281 Muscle weakness (generalized): Secondary | ICD-10-CM | POA: Diagnosis not present

## 2020-05-10 DIAGNOSIS — R279 Unspecified lack of coordination: Secondary | ICD-10-CM | POA: Diagnosis not present

## 2020-05-12 ENCOUNTER — Emergency Department (HOSPITAL_COMMUNITY)
Admission: EM | Admit: 2020-05-12 | Discharge: 2020-05-13 | Disposition: A | Discharge status: Other Acute Inpatient Hospital | Payer: PPO | Attending: Emergency Medicine | Admitting: Emergency Medicine

## 2020-05-12 ENCOUNTER — Other Ambulatory Visit: Payer: Self-pay

## 2020-05-12 DIAGNOSIS — N183 Chronic kidney disease, stage 3 unspecified: Secondary | ICD-10-CM | POA: Insufficient documentation

## 2020-05-12 DIAGNOSIS — I6529 Occlusion and stenosis of unspecified carotid artery: Secondary | ICD-10-CM | POA: Diagnosis not present

## 2020-05-12 DIAGNOSIS — I7090 Unspecified atherosclerosis: Secondary | ICD-10-CM | POA: Diagnosis not present

## 2020-05-12 DIAGNOSIS — R0902 Hypoxemia: Secondary | ICD-10-CM | POA: Diagnosis not present

## 2020-05-12 DIAGNOSIS — Z79899 Other long term (current) drug therapy: Secondary | ICD-10-CM | POA: Insufficient documentation

## 2020-05-12 DIAGNOSIS — E559 Vitamin D deficiency, unspecified: Secondary | ICD-10-CM | POA: Diagnosis not present

## 2020-05-12 DIAGNOSIS — S0990XA Unspecified injury of head, initial encounter: Secondary | ICD-10-CM | POA: Diagnosis not present

## 2020-05-12 DIAGNOSIS — W19XXXA Unspecified fall, initial encounter: Secondary | ICD-10-CM

## 2020-05-12 DIAGNOSIS — D518 Other vitamin B12 deficiency anemias: Secondary | ICD-10-CM | POA: Diagnosis not present

## 2020-05-12 DIAGNOSIS — E1122 Type 2 diabetes mellitus with diabetic chronic kidney disease: Secondary | ICD-10-CM | POA: Insufficient documentation

## 2020-05-12 DIAGNOSIS — R911 Solitary pulmonary nodule: Secondary | ICD-10-CM | POA: Diagnosis not present

## 2020-05-12 DIAGNOSIS — Z794 Long term (current) use of insulin: Secondary | ICD-10-CM | POA: Diagnosis not present

## 2020-05-12 DIAGNOSIS — R55 Syncope and collapse: Secondary | ICD-10-CM | POA: Diagnosis not present

## 2020-05-12 DIAGNOSIS — I129 Hypertensive chronic kidney disease with stage 1 through stage 4 chronic kidney disease, or unspecified chronic kidney disease: Secondary | ICD-10-CM | POA: Insufficient documentation

## 2020-05-12 DIAGNOSIS — F0390 Unspecified dementia without behavioral disturbance: Secondary | ICD-10-CM | POA: Insufficient documentation

## 2020-05-12 DIAGNOSIS — I6782 Cerebral ischemia: Secondary | ICD-10-CM | POA: Diagnosis not present

## 2020-05-12 DIAGNOSIS — R404 Transient alteration of awareness: Secondary | ICD-10-CM | POA: Diagnosis not present

## 2020-05-12 DIAGNOSIS — R279 Unspecified lack of coordination: Secondary | ICD-10-CM | POA: Diagnosis not present

## 2020-05-12 DIAGNOSIS — R269 Unspecified abnormalities of gait and mobility: Secondary | ICD-10-CM | POA: Diagnosis not present

## 2020-05-12 DIAGNOSIS — E119 Type 2 diabetes mellitus without complications: Secondary | ICD-10-CM | POA: Diagnosis not present

## 2020-05-12 DIAGNOSIS — S199XXA Unspecified injury of neck, initial encounter: Secondary | ICD-10-CM | POA: Diagnosis not present

## 2020-05-12 DIAGNOSIS — E038 Other specified hypothyroidism: Secondary | ICD-10-CM | POA: Diagnosis not present

## 2020-05-12 DIAGNOSIS — R52 Pain, unspecified: Secondary | ICD-10-CM | POA: Diagnosis not present

## 2020-05-12 DIAGNOSIS — I10 Essential (primary) hypertension: Secondary | ICD-10-CM | POA: Diagnosis not present

## 2020-05-12 DIAGNOSIS — E7849 Other hyperlipidemia: Secondary | ICD-10-CM | POA: Diagnosis not present

## 2020-05-12 DIAGNOSIS — R410 Disorientation, unspecified: Secondary | ICD-10-CM | POA: Diagnosis not present

## 2020-05-12 DIAGNOSIS — M160 Bilateral primary osteoarthritis of hip: Secondary | ICD-10-CM | POA: Diagnosis not present

## 2020-05-12 DIAGNOSIS — M6281 Muscle weakness (generalized): Secondary | ICD-10-CM | POA: Diagnosis not present

## 2020-05-12 DIAGNOSIS — R1030 Lower abdominal pain, unspecified: Secondary | ICD-10-CM | POA: Insufficient documentation

## 2020-05-12 NOTE — ED Triage Notes (Signed)
Pt presents from [facility] via [employer]. Facility reports unwitnessed fall. Pt confused at baseline. Pt reports "pain all over."

## 2020-05-13 ENCOUNTER — Emergency Department (HOSPITAL_COMMUNITY): Payer: PPO

## 2020-05-13 DIAGNOSIS — F0390 Unspecified dementia without behavioral disturbance: Secondary | ICD-10-CM | POA: Diagnosis not present

## 2020-05-13 DIAGNOSIS — I6782 Cerebral ischemia: Secondary | ICD-10-CM | POA: Diagnosis not present

## 2020-05-13 DIAGNOSIS — S0990XA Unspecified injury of head, initial encounter: Secondary | ICD-10-CM | POA: Diagnosis not present

## 2020-05-13 DIAGNOSIS — M160 Bilateral primary osteoarthritis of hip: Secondary | ICD-10-CM | POA: Diagnosis not present

## 2020-05-13 DIAGNOSIS — I7090 Unspecified atherosclerosis: Secondary | ICD-10-CM | POA: Diagnosis not present

## 2020-05-13 DIAGNOSIS — R410 Disorientation, unspecified: Secondary | ICD-10-CM | POA: Diagnosis not present

## 2020-05-13 DIAGNOSIS — I6529 Occlusion and stenosis of unspecified carotid artery: Secondary | ICD-10-CM | POA: Diagnosis not present

## 2020-05-13 DIAGNOSIS — S199XXA Unspecified injury of neck, initial encounter: Secondary | ICD-10-CM | POA: Diagnosis not present

## 2020-05-13 LAB — URINALYSIS, ROUTINE W REFLEX MICROSCOPIC
Bilirubin Urine: NEGATIVE
Glucose, UA: NEGATIVE mg/dL
Hgb urine dipstick: NEGATIVE
Ketones, ur: NEGATIVE mg/dL
Nitrite: NEGATIVE
Protein, ur: NEGATIVE mg/dL
Specific Gravity, Urine: 1.01 (ref 1.005–1.030)
pH: 6 (ref 5.0–8.0)

## 2020-05-13 LAB — CBC WITH DIFFERENTIAL/PLATELET
Abs Immature Granulocytes: 0.15 10*3/uL — ABNORMAL HIGH (ref 0.00–0.07)
Basophils Absolute: 0 10*3/uL (ref 0.0–0.1)
Basophils Relative: 0 %
Eosinophils Absolute: 0.4 10*3/uL (ref 0.0–0.5)
Eosinophils Relative: 4 %
HCT: 29.7 % — ABNORMAL LOW (ref 36.0–46.0)
Hemoglobin: 9.1 g/dL — ABNORMAL LOW (ref 12.0–15.0)
Immature Granulocytes: 1 %
Lymphocytes Relative: 12 %
Lymphs Abs: 1.2 10*3/uL (ref 0.7–4.0)
MCH: 29.2 pg (ref 26.0–34.0)
MCHC: 30.6 g/dL (ref 30.0–36.0)
MCV: 95.2 fL (ref 80.0–100.0)
Monocytes Absolute: 1.1 10*3/uL — ABNORMAL HIGH (ref 0.1–1.0)
Monocytes Relative: 11 %
Neutro Abs: 7.8 10*3/uL — ABNORMAL HIGH (ref 1.7–7.7)
Neutrophils Relative %: 72 %
Platelets: 200 10*3/uL (ref 150–400)
RBC: 3.12 MIL/uL — ABNORMAL LOW (ref 3.87–5.11)
RDW: 14.1 % (ref 11.5–15.5)
WBC: 10.8 10*3/uL — ABNORMAL HIGH (ref 4.0–10.5)
nRBC: 0 % (ref 0.0–0.2)

## 2020-05-13 LAB — COMPREHENSIVE METABOLIC PANEL
ALT: 13 U/L (ref 0–44)
AST: 19 U/L (ref 15–41)
Albumin: 2.8 g/dL — ABNORMAL LOW (ref 3.5–5.0)
Alkaline Phosphatase: 67 U/L (ref 38–126)
Anion gap: 9 (ref 5–15)
BUN: 47 mg/dL — ABNORMAL HIGH (ref 8–23)
CO2: 27 mmol/L (ref 22–32)
Calcium: 8.6 mg/dL — ABNORMAL LOW (ref 8.9–10.3)
Chloride: 102 mmol/L (ref 98–111)
Creatinine, Ser: 1.46 mg/dL — ABNORMAL HIGH (ref 0.44–1.00)
GFR calc Af Amer: 36 mL/min — ABNORMAL LOW (ref >60)
GFR calc non Af Amer: 31 mL/min — ABNORMAL LOW (ref >60)
Glucose, Bld: 113 mg/dL — ABNORMAL HIGH (ref 70–99)
Potassium: 3.4 mmol/L — ABNORMAL LOW (ref 3.5–5.1)
Sodium: 138 mmol/L (ref 135–145)
Total Bilirubin: 0.9 mg/dL (ref 0.3–1.2)
Total Protein: 6.1 g/dL — ABNORMAL LOW (ref 6.5–8.1)

## 2020-05-13 MED ORDER — FENTANYL CITRATE (PF) 100 MCG/2ML IJ SOLN
50.0000 ug | Freq: Once | INTRAMUSCULAR | Status: AC
  Administered 2020-05-13: 50 ug via INTRAVENOUS
  Filled 2020-05-13: qty 2

## 2020-05-13 MED ORDER — FOSFOMYCIN TROMETHAMINE 3 G PO PACK
3.0000 g | PACK | Freq: Once | ORAL | Status: AC
  Administered 2020-05-13: 3 g via ORAL
  Filled 2020-05-13: qty 3

## 2020-05-13 MED ORDER — LACTATED RINGERS IV BOLUS
1000.0000 mL | Freq: Once | INTRAVENOUS | Status: AC
  Administered 2020-05-13: 1000 mL via INTRAVENOUS

## 2020-05-13 NOTE — ED Provider Notes (Signed)
[facility] EMERGENCY DEPARTMENT Provider Note   CSN: [id number] Arrival date & time: 05/12/20  2312     History Chief Complaint  Patient presents with  . Fall    Kelly Freeman is a 86 y.o. female.  Dementia. Not able to offer history. Per paramedics and faciilty, found down. 'hurts all over'. Legs edematous at baseline. No obvious trauma. No obvious syncope.    Fall   Level V caveat secondary to dementia.     Past Medical History:  Diagnosis Date  . Acute cystitis without hematuria   . Acute diarrhea   . Chronic allergic rhinitis   . CKD (chronic kidney disease) stage 3, GFR 30-59 ml/min   . Cognitive impairment   . DDD (degenerative disc disease), lumbar   . Dizziness   . E. coli UTI   . Fever   . Gastroenteritis   . Hereditary and idiopathic peripheral neuropathy   . History of recurrent UTIs   . Hypertension   . Hypertension   . MGUS (monoclonal gammopathy of unknown significance)   . Migraine without aura and without status migrainosus, not intractable   . Mixed incontinence urge and stress   . Moderate episode of recurrent major depressive disorder ([place])   . Nausea and vomiting   . Obesity   . Obstructive sleep apnea   . Osteopenia   . Recurrent falls   . Restless leg syndrome   . Type 2 diabetes mellitus with kidney complication, with long-term current use of insulin ([place])   . UTI (urinary tract infection)     Patient Active Problem List   Diagnosis Date Noted  . Type 2 diabetes mellitus with kidney complication, with long-term current use of insulin ([place])   . Recurrent falls   . Moderate episode of recurrent major depressive disorder ([place])   . MGUS (monoclonal gammopathy of unknown significance)   . Hypertension   . History of recurrent UTIs   . CKD (chronic kidney disease) stage 3, GFR 30-59 ml/min     Past Surgical History:  Procedure Laterality Date  . APPENDECTOMY    . CHOLECYSTECTOMY       OB History   No obstetric history on  file.     Family History  Problem Relation Age of Onset  . Thrombosis Father 66       Coronary  . Stomach cancer Mother 65  . Arthritis Son 35  . Arthritis Daughter 54  . Cirrhosis Daughter 40       Stage 3   . Hepatitis C Daughter     Social History   Tobacco Use  . Smoking status: Never Smoker  . Smokeless tobacco: Never Used  Vaping Use  . Vaping Use: Never used  Substance Use Topics  . Alcohol use: Never  . Drug use: Never    Home Medications Prior to Admission medications   Medication Sig Start Date End Date Taking? Authorizing Provider  acetaminophen (TYLENOL) 650 MG CR tablet Take 650 mg by mouth every 8 (eight) hours as needed for pain.    [provider]  Cephalexin (KEFLEX PO) Take by mouth. Has not started    [provider]  divalproex (DEPAKOTE SPRINKLE) 125 MG capsule Take 1 capsule (125 mg total) by mouth 2 (two) times daily. 09/23/19   Lauree Chandler, NP  FUROSEMIDE PO Take by mouth. Patient hasn't started yet    [provider]  lisinopril (ZESTRIL) 2.5 MG tablet Take 1 tablet (2.5 mg total) by  mouth daily. 09/23/19   Lauree Chandler, NP  Melatonin 5 MG TABS Take 5 mg by mouth daily.    [provider]  QUEtiapine (SEROQUEL) 25 MG tablet Take 0.5 tablets (12.5 mg total) by mouth daily. 09/23/19   Lauree Chandler, NP  traZODone (DESYREL) 50 MG tablet Take 1 tablet (50 mg total) by mouth at bedtime. 09/23/19   Lauree Chandler, NP    Allergies    Levofloxacin and Gabapentin  Review of Systems   Review of Systems  Unable to perform ROS: Dementia    Physical Exam Updated Vital Signs BP (!) 142/58   Pulse 60   Temp 98.5 F (36.9 C)   Resp 15   SpO2 94%   Physical Exam Vitals and nursing note reviewed.  Constitutional:      Appearance: She is well-developed.  HENT:     Head: Normocephalic and atraumatic.     Mouth/Throat:     Mouth: Mucous membranes are moist.     Pharynx: Oropharynx is clear.    Eyes:     Pupils: Pupils are equal, round, and reactive to light.  Cardiovascular:     Rate and Rhythm: Normal rate and regular rhythm.  Pulmonary:     Effort: No respiratory distress.     Breath sounds: No stridor.  Abdominal:     General: There is no distension.     Tenderness: There is abdominal tenderness (suprapubic).  Musculoskeletal:        General: No tenderness.     Cervical back: Normal range of motion.  Skin:    General: Skin is warm and dry.  Neurological:     General: No focal deficit present.     Mental Status: She is alert.     ED Results / Procedures / Treatments   Labs (all labs ordered are listed, but only abnormal results are displayed) Labs Reviewed - No data to display  EKG None  Radiology No results found.  Procedures Procedures (including critical care time)  Medications Ordered in ED Medications - No data to display  ED Course  I have reviewed the triage vital signs and the nursing notes.  Pertinent labs & imaging results that were available during my care of the patient were reviewed by me and considered in my medical decision making (see chart for details).    MDM Rules/Calculators/A&P                         Evaluate for syncope versus mechanical fall and traumatic injuries.  Suprapubic discomfort could possibly be retained urine we will get a suprapubic bladder scan. Workup ok. No traumatic injury. Difficulty with ambulating, suspect it is baseline. No pain to suggest fracture. Doubt neurologic cause. Fosfomycin given in case of infection, culture sent. Stable for discharge.   Final Clinical Impression(s) / ED Diagnoses Final diagnoses:  None    Rx / DC Orders ED Discharge Orders    None       Elisama Thissen, Corene Cornea, MD 05/13/20 [phone]

## 2020-05-14 DIAGNOSIS — R279 Unspecified lack of coordination: Secondary | ICD-10-CM | POA: Diagnosis not present

## 2020-05-14 DIAGNOSIS — M6281 Muscle weakness (generalized): Secondary | ICD-10-CM | POA: Diagnosis not present

## 2020-05-15 LAB — URINE CULTURE: Culture: >=100000 — AB

## 2020-05-16 ENCOUNTER — Telehealth: Payer: Self-pay | Admitting: Emergency Medicine

## 2020-05-16 NOTE — Telephone Encounter (Signed)
Post ED Visit - Positive Culture Follow-up  Culture report reviewed by antimicrobial stewardship pharmacist: [facility] Team []  Elenor Quinones, Pharm.D. []  Heide Guile, Pharm.D., BCPS AQ-ID []  Parks Neptune, Pharm.D., BCPS []  Alycia Rossetti, Pharm.D., BCPS []  [place], [place].D., BCPS, AAHIVP []  Legrand Como, Pharm.D., BCPS, AAHIVP []  Salome Arnt, PharmD, BCPS []  Johnnette Gourd, PharmD, BCPS []  Hughes Better, PharmD, BCPS []  Leeroy Cha, PharmD []  Laqueta Linden, PharmD, BCPS [x]  Albertina Parr, PharmD  [place] Team []  Leodis Sias, PharmD []  Lindell Spar, PharmD []  Royetta Asal, PharmD []  Graylin Shiver, Rph []  Rema Fendt) Glennon Mac, PharmD []  Arlyn Dunning, PharmD []  Netta Cedars, PharmD []  Dia Sitter, PharmD []  Leone Haven, PharmD []  Gretta Arab, PharmD []  Theodis Shove, PharmD []  Peggyann Juba, PharmD []  Reuel Boom, PharmD   Positive urine culture Treated with Fosfomycin, organism sensitive to the same and no further patient follow-up is required at this time.  Sandi Raveling Harmonii Karle 05/16/2020, 1:52 PM

## 2020-05-17 DIAGNOSIS — M6281 Muscle weakness (generalized): Secondary | ICD-10-CM | POA: Diagnosis not present

## 2020-05-17 DIAGNOSIS — R279 Unspecified lack of coordination: Secondary | ICD-10-CM | POA: Diagnosis not present

## 2020-05-19 DIAGNOSIS — M6281 Muscle weakness (generalized): Secondary | ICD-10-CM | POA: Diagnosis not present

## 2020-05-19 DIAGNOSIS — R279 Unspecified lack of coordination: Secondary | ICD-10-CM | POA: Diagnosis not present

## 2020-05-21 DIAGNOSIS — R279 Unspecified lack of coordination: Secondary | ICD-10-CM | POA: Diagnosis not present

## 2020-05-21 DIAGNOSIS — M6281 Muscle weakness (generalized): Secondary | ICD-10-CM | POA: Diagnosis not present

## 2020-05-24 DIAGNOSIS — R279 Unspecified lack of coordination: Secondary | ICD-10-CM | POA: Diagnosis not present

## 2020-05-24 DIAGNOSIS — M6281 Muscle weakness (generalized): Secondary | ICD-10-CM | POA: Diagnosis not present

## 2020-05-25 DIAGNOSIS — D518 Other vitamin B12 deficiency anemias: Secondary | ICD-10-CM | POA: Diagnosis not present

## 2020-05-25 DIAGNOSIS — Z79899 Other long term (current) drug therapy: Secondary | ICD-10-CM | POA: Diagnosis not present

## 2020-05-25 DIAGNOSIS — E7849 Other hyperlipidemia: Secondary | ICD-10-CM | POA: Diagnosis not present

## 2020-05-25 DIAGNOSIS — E119 Type 2 diabetes mellitus without complications: Secondary | ICD-10-CM | POA: Diagnosis not present

## 2020-05-26 DIAGNOSIS — M6281 Muscle weakness (generalized): Secondary | ICD-10-CM | POA: Diagnosis not present

## 2020-05-26 DIAGNOSIS — R600 Localized edema: Secondary | ICD-10-CM | POA: Diagnosis not present

## 2020-05-26 DIAGNOSIS — S80821A Blister (nonthermal), right lower leg, initial encounter: Secondary | ICD-10-CM | POA: Diagnosis not present

## 2020-05-26 DIAGNOSIS — R0989 Other specified symptoms and signs involving the circulatory and respiratory systems: Secondary | ICD-10-CM | POA: Diagnosis not present

## 2020-05-26 DIAGNOSIS — R279 Unspecified lack of coordination: Secondary | ICD-10-CM | POA: Diagnosis not present

## 2020-05-26 DIAGNOSIS — N179 Acute kidney failure, unspecified: Secondary | ICD-10-CM | POA: Diagnosis not present

## 2020-05-26 DIAGNOSIS — D508 Other iron deficiency anemias: Secondary | ICD-10-CM | POA: Diagnosis not present

## 2020-05-26 DIAGNOSIS — I890 Lymphedema, not elsewhere classified: Secondary | ICD-10-CM | POA: Diagnosis not present

## 2020-05-27 DIAGNOSIS — E119 Type 2 diabetes mellitus without complications: Secondary | ICD-10-CM | POA: Diagnosis not present

## 2020-05-27 DIAGNOSIS — E038 Other specified hypothyroidism: Secondary | ICD-10-CM | POA: Diagnosis not present

## 2020-05-27 DIAGNOSIS — F329 Major depressive disorder, single episode, unspecified: Secondary | ICD-10-CM | POA: Diagnosis not present

## 2020-05-27 DIAGNOSIS — Z79899 Other long term (current) drug therapy: Secondary | ICD-10-CM | POA: Diagnosis not present

## 2020-05-27 DIAGNOSIS — E7849 Other hyperlipidemia: Secondary | ICD-10-CM | POA: Diagnosis not present

## 2020-05-27 DIAGNOSIS — F0390 Unspecified dementia without behavioral disturbance: Secondary | ICD-10-CM | POA: Diagnosis not present

## 2020-05-27 DIAGNOSIS — D508 Other iron deficiency anemias: Secondary | ICD-10-CM | POA: Diagnosis not present

## 2020-05-27 DIAGNOSIS — D518 Other vitamin B12 deficiency anemias: Secondary | ICD-10-CM | POA: Diagnosis not present

## 2020-05-27 DIAGNOSIS — I10 Essential (primary) hypertension: Secondary | ICD-10-CM | POA: Diagnosis not present

## 2020-05-27 DIAGNOSIS — E559 Vitamin D deficiency, unspecified: Secondary | ICD-10-CM | POA: Diagnosis not present

## 2020-05-27 DIAGNOSIS — N189 Chronic kidney disease, unspecified: Secondary | ICD-10-CM | POA: Diagnosis not present

## 2020-05-28 DIAGNOSIS — R279 Unspecified lack of coordination: Secondary | ICD-10-CM | POA: Diagnosis not present

## 2020-05-28 DIAGNOSIS — M6281 Muscle weakness (generalized): Secondary | ICD-10-CM | POA: Diagnosis not present

## 2020-05-31 DIAGNOSIS — R279 Unspecified lack of coordination: Secondary | ICD-10-CM | POA: Diagnosis not present

## 2020-05-31 DIAGNOSIS — M6281 Muscle weakness (generalized): Secondary | ICD-10-CM | POA: Diagnosis not present

## 2020-06-01 DIAGNOSIS — E7849 Other hyperlipidemia: Secondary | ICD-10-CM | POA: Diagnosis not present

## 2020-06-01 DIAGNOSIS — D518 Other vitamin B12 deficiency anemias: Secondary | ICD-10-CM | POA: Diagnosis not present

## 2020-06-01 DIAGNOSIS — E119 Type 2 diabetes mellitus without complications: Secondary | ICD-10-CM | POA: Diagnosis not present

## 2020-06-01 DIAGNOSIS — Z79899 Other long term (current) drug therapy: Secondary | ICD-10-CM | POA: Diagnosis not present

## 2020-06-02 DIAGNOSIS — R279 Unspecified lack of coordination: Secondary | ICD-10-CM | POA: Diagnosis not present

## 2020-06-02 DIAGNOSIS — M6281 Muscle weakness (generalized): Secondary | ICD-10-CM | POA: Diagnosis not present

## 2020-06-03 DIAGNOSIS — F419 Anxiety disorder, unspecified: Secondary | ICD-10-CM | POA: Diagnosis not present

## 2020-06-03 DIAGNOSIS — F0391 Unspecified dementia with behavioral disturbance: Secondary | ICD-10-CM | POA: Diagnosis not present

## 2020-06-07 DIAGNOSIS — R279 Unspecified lack of coordination: Secondary | ICD-10-CM | POA: Diagnosis not present

## 2020-06-07 DIAGNOSIS — M6281 Muscle weakness (generalized): Secondary | ICD-10-CM | POA: Diagnosis not present

## 2020-06-09 DIAGNOSIS — M6281 Muscle weakness (generalized): Secondary | ICD-10-CM | POA: Diagnosis not present

## 2020-06-09 DIAGNOSIS — R279 Unspecified lack of coordination: Secondary | ICD-10-CM | POA: Diagnosis not present

## 2020-06-11 DIAGNOSIS — R279 Unspecified lack of coordination: Secondary | ICD-10-CM | POA: Diagnosis not present

## 2020-06-11 DIAGNOSIS — M6281 Muscle weakness (generalized): Secondary | ICD-10-CM | POA: Diagnosis not present

## 2020-06-14 DIAGNOSIS — R279 Unspecified lack of coordination: Secondary | ICD-10-CM | POA: Diagnosis not present

## 2020-06-14 DIAGNOSIS — M6281 Muscle weakness (generalized): Secondary | ICD-10-CM | POA: Diagnosis not present

## 2020-06-15 DIAGNOSIS — I10 Essential (primary) hypertension: Secondary | ICD-10-CM | POA: Diagnosis not present

## 2020-06-15 DIAGNOSIS — E119 Type 2 diabetes mellitus without complications: Secondary | ICD-10-CM | POA: Diagnosis not present

## 2020-06-15 DIAGNOSIS — E559 Vitamin D deficiency, unspecified: Secondary | ICD-10-CM | POA: Diagnosis not present

## 2020-06-15 DIAGNOSIS — D508 Other iron deficiency anemias: Secondary | ICD-10-CM | POA: Diagnosis not present

## 2020-06-15 DIAGNOSIS — F0390 Unspecified dementia without behavioral disturbance: Secondary | ICD-10-CM | POA: Diagnosis not present

## 2020-06-15 DIAGNOSIS — N189 Chronic kidney disease, unspecified: Secondary | ICD-10-CM | POA: Diagnosis not present

## 2020-06-15 DIAGNOSIS — D518 Other vitamin B12 deficiency anemias: Secondary | ICD-10-CM | POA: Diagnosis not present

## 2020-06-15 DIAGNOSIS — E038 Other specified hypothyroidism: Secondary | ICD-10-CM | POA: Diagnosis not present

## 2020-06-15 DIAGNOSIS — F329 Major depressive disorder, single episode, unspecified: Secondary | ICD-10-CM | POA: Diagnosis not present

## 2020-06-16 DIAGNOSIS — M6281 Muscle weakness (generalized): Secondary | ICD-10-CM | POA: Diagnosis not present

## 2020-06-16 DIAGNOSIS — R279 Unspecified lack of coordination: Secondary | ICD-10-CM | POA: Diagnosis not present

## 2020-06-18 DIAGNOSIS — B351 Tinea unguium: Secondary | ICD-10-CM | POA: Diagnosis not present

## 2020-06-18 DIAGNOSIS — N183 Chronic kidney disease, stage 3 unspecified: Secondary | ICD-10-CM | POA: Diagnosis not present

## 2020-06-22 DIAGNOSIS — D508 Other iron deficiency anemias: Secondary | ICD-10-CM | POA: Diagnosis not present

## 2020-06-22 DIAGNOSIS — F419 Anxiety disorder, unspecified: Secondary | ICD-10-CM | POA: Diagnosis not present

## 2020-06-22 DIAGNOSIS — I10 Essential (primary) hypertension: Secondary | ICD-10-CM | POA: Diagnosis not present

## 2020-06-22 DIAGNOSIS — R600 Localized edema: Secondary | ICD-10-CM | POA: Diagnosis not present

## 2020-06-25 DIAGNOSIS — R269 Unspecified abnormalities of gait and mobility: Secondary | ICD-10-CM | POA: Diagnosis not present

## 2020-06-25 DIAGNOSIS — M6281 Muscle weakness (generalized): Secondary | ICD-10-CM | POA: Diagnosis not present

## 2020-06-29 DIAGNOSIS — R269 Unspecified abnormalities of gait and mobility: Secondary | ICD-10-CM | POA: Diagnosis not present

## 2020-06-29 DIAGNOSIS — M6281 Muscle weakness (generalized): Secondary | ICD-10-CM | POA: Diagnosis not present

## 2020-06-29 DIAGNOSIS — I890 Lymphedema, not elsewhere classified: Secondary | ICD-10-CM | POA: Diagnosis not present

## 2020-06-29 DIAGNOSIS — F329 Major depressive disorder, single episode, unspecified: Secondary | ICD-10-CM | POA: Diagnosis not present

## 2020-06-29 DIAGNOSIS — I82402 Acute embolism and thrombosis of unspecified deep veins of left lower extremity: Secondary | ICD-10-CM | POA: Diagnosis not present

## 2020-07-01 DIAGNOSIS — M6281 Muscle weakness (generalized): Secondary | ICD-10-CM | POA: Diagnosis not present

## 2020-07-01 DIAGNOSIS — I890 Lymphedema, not elsewhere classified: Secondary | ICD-10-CM | POA: Diagnosis not present

## 2020-07-01 DIAGNOSIS — F329 Major depressive disorder, single episode, unspecified: Secondary | ICD-10-CM | POA: Diagnosis not present

## 2020-07-01 DIAGNOSIS — R269 Unspecified abnormalities of gait and mobility: Secondary | ICD-10-CM | POA: Diagnosis not present

## 2020-07-01 DIAGNOSIS — I82402 Acute embolism and thrombosis of unspecified deep veins of left lower extremity: Secondary | ICD-10-CM | POA: Diagnosis not present

## 2020-07-06 DIAGNOSIS — R590 Localized enlarged lymph nodes: Secondary | ICD-10-CM | POA: Diagnosis not present

## 2020-07-06 DIAGNOSIS — D492 Neoplasm of unspecified behavior of bone, soft tissue, and skin: Secondary | ICD-10-CM | POA: Diagnosis not present

## 2020-07-06 DIAGNOSIS — R229 Localized swelling, mass and lump, unspecified: Secondary | ICD-10-CM | POA: Diagnosis not present

## 2020-07-06 DIAGNOSIS — I6523 Occlusion and stenosis of bilateral carotid arteries: Secondary | ICD-10-CM | POA: Diagnosis not present

## 2020-07-06 DIAGNOSIS — R0989 Other specified symptoms and signs involving the circulatory and respiratory systems: Secondary | ICD-10-CM | POA: Diagnosis not present

## 2020-07-07 DIAGNOSIS — F419 Anxiety disorder, unspecified: Secondary | ICD-10-CM | POA: Diagnosis not present

## 2020-07-07 DIAGNOSIS — F0391 Unspecified dementia with behavioral disturbance: Secondary | ICD-10-CM | POA: Diagnosis not present

## 2020-07-09 DIAGNOSIS — E042 Nontoxic multinodular goiter: Secondary | ICD-10-CM | POA: Diagnosis not present

## 2020-07-09 DIAGNOSIS — R221 Localized swelling, mass and lump, neck: Secondary | ICD-10-CM | POA: Diagnosis not present

## 2020-07-14 DIAGNOSIS — D508 Other iron deficiency anemias: Secondary | ICD-10-CM | POA: Diagnosis not present

## 2020-07-14 DIAGNOSIS — E038 Other specified hypothyroidism: Secondary | ICD-10-CM | POA: Diagnosis not present

## 2020-07-14 DIAGNOSIS — N189 Chronic kidney disease, unspecified: Secondary | ICD-10-CM | POA: Diagnosis not present

## 2020-07-14 DIAGNOSIS — D518 Other vitamin B12 deficiency anemias: Secondary | ICD-10-CM | POA: Diagnosis not present

## 2020-07-14 DIAGNOSIS — I10 Essential (primary) hypertension: Secondary | ICD-10-CM | POA: Diagnosis not present

## 2020-07-14 DIAGNOSIS — E119 Type 2 diabetes mellitus without complications: Secondary | ICD-10-CM | POA: Diagnosis not present

## 2020-07-14 DIAGNOSIS — F329 Major depressive disorder, single episode, unspecified: Secondary | ICD-10-CM | POA: Diagnosis not present

## 2020-07-14 DIAGNOSIS — E559 Vitamin D deficiency, unspecified: Secondary | ICD-10-CM | POA: Diagnosis not present

## 2020-07-14 DIAGNOSIS — F0390 Unspecified dementia without behavioral disturbance: Secondary | ICD-10-CM | POA: Diagnosis not present

## 2020-07-20 DIAGNOSIS — D508 Other iron deficiency anemias: Secondary | ICD-10-CM | POA: Diagnosis not present

## 2020-07-20 DIAGNOSIS — I10 Essential (primary) hypertension: Secondary | ICD-10-CM | POA: Diagnosis not present

## 2020-07-20 DIAGNOSIS — R600 Localized edema: Secondary | ICD-10-CM | POA: Diagnosis not present

## 2020-07-20 DIAGNOSIS — N183 Chronic kidney disease, stage 3 unspecified: Secondary | ICD-10-CM | POA: Diagnosis not present

## 2020-07-26 DIAGNOSIS — M6281 Muscle weakness (generalized): Secondary | ICD-10-CM | POA: Diagnosis not present

## 2020-07-26 DIAGNOSIS — R279 Unspecified lack of coordination: Secondary | ICD-10-CM | POA: Diagnosis not present

## 2020-08-03 DIAGNOSIS — R0789 Other chest pain: Secondary | ICD-10-CM | POA: Diagnosis not present

## 2020-08-03 DIAGNOSIS — F0390 Unspecified dementia without behavioral disturbance: Secondary | ICD-10-CM | POA: Diagnosis not present

## 2020-08-11 DIAGNOSIS — D518 Other vitamin B12 deficiency anemias: Secondary | ICD-10-CM | POA: Diagnosis not present

## 2020-08-11 DIAGNOSIS — E038 Other specified hypothyroidism: Secondary | ICD-10-CM | POA: Diagnosis not present

## 2020-08-11 DIAGNOSIS — E7849 Other hyperlipidemia: Secondary | ICD-10-CM | POA: Diagnosis not present

## 2020-08-11 DIAGNOSIS — E119 Type 2 diabetes mellitus without complications: Secondary | ICD-10-CM | POA: Diagnosis not present

## 2020-08-11 DIAGNOSIS — Z79899 Other long term (current) drug therapy: Secondary | ICD-10-CM | POA: Diagnosis not present

## 2020-08-11 DIAGNOSIS — E559 Vitamin D deficiency, unspecified: Secondary | ICD-10-CM | POA: Diagnosis not present

## 2020-08-12 DIAGNOSIS — I10 Essential (primary) hypertension: Secondary | ICD-10-CM | POA: Diagnosis not present

## 2020-08-12 DIAGNOSIS — N189 Chronic kidney disease, unspecified: Secondary | ICD-10-CM | POA: Diagnosis not present

## 2020-08-12 DIAGNOSIS — D508 Other iron deficiency anemias: Secondary | ICD-10-CM | POA: Diagnosis not present

## 2020-08-12 DIAGNOSIS — E559 Vitamin D deficiency, unspecified: Secondary | ICD-10-CM | POA: Diagnosis not present

## 2020-08-12 DIAGNOSIS — F329 Major depressive disorder, single episode, unspecified: Secondary | ICD-10-CM | POA: Diagnosis not present

## 2020-08-12 DIAGNOSIS — F0390 Unspecified dementia without behavioral disturbance: Secondary | ICD-10-CM | POA: Diagnosis not present

## 2020-08-12 DIAGNOSIS — D518 Other vitamin B12 deficiency anemias: Secondary | ICD-10-CM | POA: Diagnosis not present

## 2020-08-12 DIAGNOSIS — E038 Other specified hypothyroidism: Secondary | ICD-10-CM | POA: Diagnosis not present

## 2020-08-12 DIAGNOSIS — E119 Type 2 diabetes mellitus without complications: Secondary | ICD-10-CM | POA: Diagnosis not present

## 2020-08-18 DIAGNOSIS — U071 COVID-19: Secondary | ICD-10-CM | POA: Diagnosis not present

## 2020-08-18 DIAGNOSIS — Z20828 Contact with and (suspected) exposure to other viral communicable diseases: Secondary | ICD-10-CM | POA: Diagnosis not present

## 2020-08-26 DIAGNOSIS — F0391 Unspecified dementia with behavioral disturbance: Secondary | ICD-10-CM | POA: Diagnosis not present

## 2020-08-26 DIAGNOSIS — F419 Anxiety disorder, unspecified: Secondary | ICD-10-CM | POA: Diagnosis not present

## 2020-08-31 DIAGNOSIS — F329 Major depressive disorder, single episode, unspecified: Secondary | ICD-10-CM | POA: Diagnosis not present

## 2020-08-31 DIAGNOSIS — R600 Localized edema: Secondary | ICD-10-CM | POA: Diagnosis not present

## 2020-08-31 DIAGNOSIS — F0390 Unspecified dementia without behavioral disturbance: Secondary | ICD-10-CM | POA: Diagnosis not present

## 2020-08-31 DIAGNOSIS — I10 Essential (primary) hypertension: Secondary | ICD-10-CM | POA: Diagnosis not present

## 2020-09-09 DIAGNOSIS — F419 Anxiety disorder, unspecified: Secondary | ICD-10-CM | POA: Diagnosis not present

## 2020-09-09 DIAGNOSIS — F0391 Unspecified dementia with behavioral disturbance: Secondary | ICD-10-CM | POA: Diagnosis not present

## 2020-09-13 DIAGNOSIS — E559 Vitamin D deficiency, unspecified: Secondary | ICD-10-CM | POA: Diagnosis not present

## 2020-09-13 DIAGNOSIS — F329 Major depressive disorder, single episode, unspecified: Secondary | ICD-10-CM | POA: Diagnosis not present

## 2020-09-13 DIAGNOSIS — F0390 Unspecified dementia without behavioral disturbance: Secondary | ICD-10-CM | POA: Diagnosis not present

## 2020-09-13 DIAGNOSIS — E119 Type 2 diabetes mellitus without complications: Secondary | ICD-10-CM | POA: Diagnosis not present

## 2020-09-13 DIAGNOSIS — D508 Other iron deficiency anemias: Secondary | ICD-10-CM | POA: Diagnosis not present

## 2020-09-13 DIAGNOSIS — I10 Essential (primary) hypertension: Secondary | ICD-10-CM | POA: Diagnosis not present

## 2020-09-13 DIAGNOSIS — N189 Chronic kidney disease, unspecified: Secondary | ICD-10-CM | POA: Diagnosis not present

## 2020-09-13 DIAGNOSIS — D518 Other vitamin B12 deficiency anemias: Secondary | ICD-10-CM | POA: Diagnosis not present

## 2020-09-13 DIAGNOSIS — E038 Other specified hypothyroidism: Secondary | ICD-10-CM | POA: Diagnosis not present

## 2020-09-27 DIAGNOSIS — F419 Anxiety disorder, unspecified: Secondary | ICD-10-CM | POA: Diagnosis not present

## 2020-09-27 DIAGNOSIS — F0391 Unspecified dementia with behavioral disturbance: Secondary | ICD-10-CM | POA: Diagnosis not present

## 2020-09-28 DIAGNOSIS — F0390 Unspecified dementia without behavioral disturbance: Secondary | ICD-10-CM | POA: Diagnosis not present

## 2020-09-28 DIAGNOSIS — N183 Chronic kidney disease, stage 3 unspecified: Secondary | ICD-10-CM | POA: Diagnosis not present

## 2020-09-28 DIAGNOSIS — I10 Essential (primary) hypertension: Secondary | ICD-10-CM | POA: Diagnosis not present

## 2020-09-28 DIAGNOSIS — R600 Localized edema: Secondary | ICD-10-CM | POA: Diagnosis not present

## 2020-09-28 DIAGNOSIS — F329 Major depressive disorder, single episode, unspecified: Secondary | ICD-10-CM | POA: Diagnosis not present

## 2020-10-07 DIAGNOSIS — F419 Anxiety disorder, unspecified: Secondary | ICD-10-CM | POA: Diagnosis not present

## 2020-10-07 DIAGNOSIS — F0391 Unspecified dementia with behavioral disturbance: Secondary | ICD-10-CM | POA: Diagnosis not present

## 2020-10-15 DIAGNOSIS — D508 Other iron deficiency anemias: Secondary | ICD-10-CM | POA: Diagnosis not present

## 2020-10-15 DIAGNOSIS — F0390 Unspecified dementia without behavioral disturbance: Secondary | ICD-10-CM | POA: Diagnosis not present

## 2020-10-15 DIAGNOSIS — D518 Other vitamin B12 deficiency anemias: Secondary | ICD-10-CM | POA: Diagnosis not present

## 2020-10-15 DIAGNOSIS — F329 Major depressive disorder, single episode, unspecified: Secondary | ICD-10-CM | POA: Diagnosis not present

## 2020-10-15 DIAGNOSIS — E038 Other specified hypothyroidism: Secondary | ICD-10-CM | POA: Diagnosis not present

## 2020-10-15 DIAGNOSIS — N189 Chronic kidney disease, unspecified: Secondary | ICD-10-CM | POA: Diagnosis not present

## 2020-10-15 DIAGNOSIS — I10 Essential (primary) hypertension: Secondary | ICD-10-CM | POA: Diagnosis not present

## 2020-10-15 DIAGNOSIS — E119 Type 2 diabetes mellitus without complications: Secondary | ICD-10-CM | POA: Diagnosis not present

## 2020-10-15 DIAGNOSIS — E559 Vitamin D deficiency, unspecified: Secondary | ICD-10-CM | POA: Diagnosis not present

## 2020-10-27 DIAGNOSIS — G4700 Insomnia, unspecified: Secondary | ICD-10-CM | POA: Diagnosis not present

## 2020-10-27 DIAGNOSIS — R600 Localized edema: Secondary | ICD-10-CM | POA: Diagnosis not present

## 2020-10-27 DIAGNOSIS — D508 Other iron deficiency anemias: Secondary | ICD-10-CM | POA: Diagnosis not present

## 2020-10-27 DIAGNOSIS — I872 Venous insufficiency (chronic) (peripheral): Secondary | ICD-10-CM | POA: Diagnosis not present

## 2020-10-27 DIAGNOSIS — F0390 Unspecified dementia without behavioral disturbance: Secondary | ICD-10-CM | POA: Diagnosis not present

## 2020-10-27 DIAGNOSIS — E119 Type 2 diabetes mellitus without complications: Secondary | ICD-10-CM | POA: Diagnosis not present

## 2020-10-27 DIAGNOSIS — I10 Essential (primary) hypertension: Secondary | ICD-10-CM | POA: Diagnosis not present

## 2020-11-05 DIAGNOSIS — Z20828 Contact with and (suspected) exposure to other viral communicable diseases: Secondary | ICD-10-CM | POA: Diagnosis not present

## 2020-11-05 DIAGNOSIS — U071 COVID-19: Secondary | ICD-10-CM | POA: Diagnosis not present

## 2020-11-05 DIAGNOSIS — B351 Tinea unguium: Secondary | ICD-10-CM | POA: Diagnosis not present

## 2020-11-08 DIAGNOSIS — F0391 Unspecified dementia with behavioral disturbance: Secondary | ICD-10-CM | POA: Diagnosis not present

## 2020-11-08 DIAGNOSIS — F419 Anxiety disorder, unspecified: Secondary | ICD-10-CM | POA: Diagnosis not present

## 2020-11-22 DIAGNOSIS — E119 Type 2 diabetes mellitus without complications: Secondary | ICD-10-CM | POA: Diagnosis not present

## 2020-11-22 DIAGNOSIS — D508 Other iron deficiency anemias: Secondary | ICD-10-CM | POA: Diagnosis not present

## 2020-11-22 DIAGNOSIS — N189 Chronic kidney disease, unspecified: Secondary | ICD-10-CM | POA: Diagnosis not present

## 2020-11-22 DIAGNOSIS — F0390 Unspecified dementia without behavioral disturbance: Secondary | ICD-10-CM | POA: Diagnosis not present

## 2020-11-22 DIAGNOSIS — D518 Other vitamin B12 deficiency anemias: Secondary | ICD-10-CM | POA: Diagnosis not present

## 2020-11-22 DIAGNOSIS — E559 Vitamin D deficiency, unspecified: Secondary | ICD-10-CM | POA: Diagnosis not present

## 2020-11-22 DIAGNOSIS — I10 Essential (primary) hypertension: Secondary | ICD-10-CM | POA: Diagnosis not present

## 2020-11-22 DIAGNOSIS — F329 Major depressive disorder, single episode, unspecified: Secondary | ICD-10-CM | POA: Diagnosis not present

## 2020-11-22 DIAGNOSIS — E038 Other specified hypothyroidism: Secondary | ICD-10-CM | POA: Diagnosis not present

## 2020-11-24 DIAGNOSIS — I872 Venous insufficiency (chronic) (peripheral): Secondary | ICD-10-CM | POA: Diagnosis not present

## 2020-11-24 DIAGNOSIS — E119 Type 2 diabetes mellitus without complications: Secondary | ICD-10-CM | POA: Diagnosis not present

## 2020-11-24 DIAGNOSIS — I10 Essential (primary) hypertension: Secondary | ICD-10-CM | POA: Diagnosis not present

## 2020-11-24 DIAGNOSIS — D508 Other iron deficiency anemias: Secondary | ICD-10-CM | POA: Diagnosis not present

## 2020-11-24 DIAGNOSIS — F329 Major depressive disorder, single episode, unspecified: Secondary | ICD-10-CM | POA: Diagnosis not present

## 2020-11-24 DIAGNOSIS — F0390 Unspecified dementia without behavioral disturbance: Secondary | ICD-10-CM | POA: Diagnosis not present

## 2020-11-24 DIAGNOSIS — R600 Localized edema: Secondary | ICD-10-CM | POA: Diagnosis not present

## 2020-11-24 DIAGNOSIS — G4700 Insomnia, unspecified: Secondary | ICD-10-CM | POA: Diagnosis not present

## 2020-12-17 ENCOUNTER — Other Ambulatory Visit: Payer: Self-pay

## 2020-12-17 ENCOUNTER — Emergency Department (HOSPITAL_COMMUNITY): Payer: Medicare Other

## 2020-12-17 ENCOUNTER — Encounter (HOSPITAL_COMMUNITY): Payer: Self-pay | Admitting: Emergency Medicine

## 2020-12-17 ENCOUNTER — Inpatient Hospital Stay (HOSPITAL_COMMUNITY)
Admission: EM | Admit: 2020-12-17 | Discharge: 2020-12-22 | DRG: 605 | Disposition: A | Discharge status: Nursing Home (Includes ICF) | Payer: Medicare Other | Source: Skilled Nursing Facility | Attending: Internal Medicine | Admitting: Internal Medicine

## 2020-12-17 DIAGNOSIS — R451 Restlessness and agitation: Secondary | ICD-10-CM | POA: Diagnosis present

## 2020-12-17 DIAGNOSIS — Z66 Do not resuscitate: Secondary | ICD-10-CM | POA: Diagnosis present

## 2020-12-17 DIAGNOSIS — T148XXA Other injury of unspecified body region, initial encounter: Secondary | ICD-10-CM | POA: Diagnosis not present

## 2020-12-17 DIAGNOSIS — I10 Essential (primary) hypertension: Secondary | ICD-10-CM | POA: Diagnosis not present

## 2020-12-17 DIAGNOSIS — Z87440 Personal history of urinary (tract) infections: Secondary | ICD-10-CM

## 2020-12-17 DIAGNOSIS — F0390 Unspecified dementia without behavioral disturbance: Secondary | ICD-10-CM | POA: Diagnosis present

## 2020-12-17 DIAGNOSIS — M549 Dorsalgia, unspecified: Secondary | ICD-10-CM | POA: Diagnosis not present

## 2020-12-17 DIAGNOSIS — G2581 Restless legs syndrome: Secondary | ICD-10-CM | POA: Diagnosis not present

## 2020-12-17 DIAGNOSIS — D472 Monoclonal gammopathy: Secondary | ICD-10-CM | POA: Diagnosis present

## 2020-12-17 DIAGNOSIS — G4733 Obstructive sleep apnea (adult) (pediatric): Secondary | ICD-10-CM | POA: Diagnosis present

## 2020-12-17 DIAGNOSIS — Z20822 Contact with and (suspected) exposure to covid-19: Secondary | ICD-10-CM | POA: Diagnosis not present

## 2020-12-17 DIAGNOSIS — Y92009 Unspecified place in unspecified non-institutional (private) residence as the place of occurrence of the external cause: Secondary | ICD-10-CM

## 2020-12-17 DIAGNOSIS — E669 Obesity, unspecified: Secondary | ICD-10-CM | POA: Diagnosis present

## 2020-12-17 DIAGNOSIS — R296 Repeated falls: Secondary | ICD-10-CM | POA: Diagnosis not present

## 2020-12-17 DIAGNOSIS — G4700 Insomnia, unspecified: Secondary | ICD-10-CM | POA: Diagnosis present

## 2020-12-17 DIAGNOSIS — Z886 Allergy status to analgesic agent status: Secondary | ICD-10-CM

## 2020-12-17 DIAGNOSIS — S300XXA Contusion of lower back and pelvis, initial encounter: Secondary | ICD-10-CM | POA: Diagnosis not present

## 2020-12-17 DIAGNOSIS — M7989 Other specified soft tissue disorders: Secondary | ICD-10-CM | POA: Diagnosis not present

## 2020-12-17 DIAGNOSIS — M7981 Nontraumatic hematoma of soft tissue: Secondary | ICD-10-CM | POA: Diagnosis not present

## 2020-12-17 DIAGNOSIS — D62 Acute posthemorrhagic anemia: Secondary | ICD-10-CM | POA: Diagnosis not present

## 2020-12-17 DIAGNOSIS — N390 Urinary tract infection, site not specified: Secondary | ICD-10-CM | POA: Diagnosis present

## 2020-12-17 DIAGNOSIS — R52 Pain, unspecified: Secondary | ICD-10-CM

## 2020-12-17 DIAGNOSIS — Z881 Allergy status to other antibiotic agents status: Secondary | ICD-10-CM

## 2020-12-17 DIAGNOSIS — N179 Acute kidney failure, unspecified: Secondary | ICD-10-CM | POA: Diagnosis present

## 2020-12-17 DIAGNOSIS — N1832 Chronic kidney disease, stage 3b: Secondary | ICD-10-CM | POA: Diagnosis not present

## 2020-12-17 DIAGNOSIS — Z781 Physical restraint status: Secondary | ICD-10-CM

## 2020-12-17 DIAGNOSIS — Z743 Need for continuous supervision: Secondary | ICD-10-CM | POA: Diagnosis not present

## 2020-12-17 DIAGNOSIS — S3991XA Unspecified injury of abdomen, initial encounter: Secondary | ICD-10-CM | POA: Diagnosis not present

## 2020-12-17 DIAGNOSIS — F05 Delirium due to known physiological condition: Secondary | ICD-10-CM | POA: Diagnosis present

## 2020-12-17 DIAGNOSIS — I959 Hypotension, unspecified: Secondary | ICD-10-CM | POA: Diagnosis not present

## 2020-12-17 DIAGNOSIS — Z7189 Other specified counseling: Secondary | ICD-10-CM | POA: Diagnosis not present

## 2020-12-17 DIAGNOSIS — D631 Anemia in chronic kidney disease: Secondary | ICD-10-CM | POA: Diagnosis not present

## 2020-12-17 DIAGNOSIS — E1122 Type 2 diabetes mellitus with diabetic chronic kidney disease: Secondary | ICD-10-CM | POA: Diagnosis present

## 2020-12-17 DIAGNOSIS — Z79891 Long term (current) use of opiate analgesic: Secondary | ICD-10-CM

## 2020-12-17 DIAGNOSIS — W010XXA Fall on same level from slipping, tripping and stumbling without subsequent striking against object, initial encounter: Secondary | ICD-10-CM | POA: Diagnosis present

## 2020-12-17 DIAGNOSIS — G609 Hereditary and idiopathic neuropathy, unspecified: Secondary | ICD-10-CM | POA: Diagnosis present

## 2020-12-17 DIAGNOSIS — J309 Allergic rhinitis, unspecified: Secondary | ICD-10-CM | POA: Diagnosis not present

## 2020-12-17 DIAGNOSIS — Z7401 Bed confinement status: Secondary | ICD-10-CM | POA: Diagnosis not present

## 2020-12-17 DIAGNOSIS — W19XXXA Unspecified fall, initial encounter: Secondary | ICD-10-CM | POA: Diagnosis not present

## 2020-12-17 DIAGNOSIS — E1142 Type 2 diabetes mellitus with diabetic polyneuropathy: Secondary | ICD-10-CM | POA: Diagnosis not present

## 2020-12-17 DIAGNOSIS — Z9049 Acquired absence of other specified parts of digestive tract: Secondary | ICD-10-CM

## 2020-12-17 DIAGNOSIS — M2550 Pain in unspecified joint: Secondary | ICD-10-CM | POA: Diagnosis not present

## 2020-12-17 DIAGNOSIS — Z86718 Personal history of other venous thrombosis and embolism: Secondary | ICD-10-CM | POA: Diagnosis not present

## 2020-12-17 DIAGNOSIS — J3489 Other specified disorders of nose and nasal sinuses: Secondary | ICD-10-CM | POA: Diagnosis not present

## 2020-12-17 DIAGNOSIS — R5381 Other malaise: Secondary | ICD-10-CM | POA: Diagnosis not present

## 2020-12-17 DIAGNOSIS — E860 Dehydration: Secondary | ICD-10-CM | POA: Diagnosis present

## 2020-12-17 DIAGNOSIS — Z7901 Long term (current) use of anticoagulants: Secondary | ICD-10-CM

## 2020-12-17 DIAGNOSIS — B961 Klebsiella pneumoniae [K. pneumoniae] as the cause of diseases classified elsewhere: Secondary | ICD-10-CM | POA: Diagnosis present

## 2020-12-17 DIAGNOSIS — Z515 Encounter for palliative care: Secondary | ICD-10-CM | POA: Diagnosis not present

## 2020-12-17 DIAGNOSIS — M25552 Pain in left hip: Secondary | ICD-10-CM | POA: Diagnosis not present

## 2020-12-17 DIAGNOSIS — N281 Cyst of kidney, acquired: Secondary | ICD-10-CM | POA: Diagnosis not present

## 2020-12-17 DIAGNOSIS — Z79899 Other long term (current) drug therapy: Secondary | ICD-10-CM

## 2020-12-17 DIAGNOSIS — R609 Edema, unspecified: Secondary | ICD-10-CM | POA: Diagnosis not present

## 2020-12-17 DIAGNOSIS — R0902 Hypoxemia: Secondary | ICD-10-CM | POA: Diagnosis not present

## 2020-12-17 DIAGNOSIS — F331 Major depressive disorder, recurrent, moderate: Secondary | ICD-10-CM | POA: Diagnosis present

## 2020-12-17 DIAGNOSIS — R410 Disorientation, unspecified: Secondary | ICD-10-CM | POA: Diagnosis not present

## 2020-12-17 DIAGNOSIS — I129 Hypertensive chronic kidney disease with stage 1 through stage 4 chronic kidney disease, or unspecified chronic kidney disease: Secondary | ICD-10-CM | POA: Diagnosis present

## 2020-12-17 DIAGNOSIS — G319 Degenerative disease of nervous system, unspecified: Secondary | ICD-10-CM | POA: Diagnosis not present

## 2020-12-17 LAB — CBC WITH DIFFERENTIAL/PLATELET
Abs Immature Granulocytes: 0.05 10*3/uL (ref 0.00–0.07)
Basophils Absolute: 0 10*3/uL (ref 0.0–0.1)
Basophils Relative: 0 %
Eosinophils Absolute: 0.2 10*3/uL (ref 0.0–0.5)
Eosinophils Relative: 2 %
HCT: 35.4 % — ABNORMAL LOW (ref 36.0–46.0)
Hemoglobin: 11.1 g/dL — ABNORMAL LOW (ref 12.0–15.0)
Immature Granulocytes: 1 %
Lymphocytes Relative: 18 %
Lymphs Abs: 1.6 10*3/uL (ref 0.7–4.0)
MCH: 29.7 pg (ref 26.0–34.0)
MCHC: 31.4 g/dL (ref 30.0–36.0)
MCV: 94.7 fL (ref 80.0–100.0)
Monocytes Absolute: 0.8 10*3/uL (ref 0.1–1.0)
Monocytes Relative: 9 %
Neutro Abs: 6 10*3/uL (ref 1.7–7.7)
Neutrophils Relative %: 70 %
Platelets: 161 10*3/uL (ref 150–400)
RBC: 3.74 MIL/uL — ABNORMAL LOW (ref 3.87–5.11)
RDW: 13.4 % (ref 11.5–15.5)
WBC: 8.7 10*3/uL (ref 4.0–10.5)
nRBC: 0 % (ref 0.0–0.2)

## 2020-12-17 LAB — BASIC METABOLIC PANEL
Anion gap: 7 (ref 5–15)
BUN: 54 mg/dL — ABNORMAL HIGH (ref 8–23)
CO2: 28 mmol/L (ref 22–32)
Calcium: 8.9 mg/dL (ref 8.9–10.3)
Chloride: 101 mmol/L (ref 98–111)
Creatinine, Ser: 1.65 mg/dL — ABNORMAL HIGH (ref 0.44–1.00)
GFR, Estimated: 29 mL/min — ABNORMAL LOW (ref >60)
Glucose, Bld: 111 mg/dL — ABNORMAL HIGH (ref 70–99)
Potassium: 4.2 mmol/L (ref 3.5–5.1)
Sodium: 136 mmol/L (ref 135–145)

## 2020-12-17 LAB — URINALYSIS, ROUTINE W REFLEX MICROSCOPIC
Bilirubin Urine: NEGATIVE
Glucose, UA: NEGATIVE mg/dL
Hgb urine dipstick: NEGATIVE
Ketones, ur: NEGATIVE mg/dL
Nitrite: NEGATIVE
Protein, ur: NEGATIVE mg/dL
Specific Gravity, Urine: 1.015 (ref 1.005–1.030)
pH: 5 (ref 5.0–8.0)

## 2020-12-17 LAB — CBG MONITORING, ED: Glucose-Capillary: 116 mg/dL — ABNORMAL HIGH (ref 70–99)

## 2020-12-17 MED ORDER — SODIUM CHLORIDE 0.9 % IV SOLN
1.0000 g | Freq: Once | INTRAVENOUS | Status: AC
  Administered 2020-12-18: 1 g via INTRAVENOUS
  Filled 2020-12-17: qty 10

## 2020-12-17 MED ORDER — ACETAMINOPHEN 325 MG PO TABS
650.0000 mg | ORAL_TABLET | Freq: Once | ORAL | Status: AC
  Administered 2020-12-17: 650 mg via ORAL
  Filled 2020-12-17: qty 2

## 2020-12-17 MED ORDER — IOHEXOL 300 MG/ML  SOLN
75.0000 mL | Freq: Once | INTRAMUSCULAR | Status: AC | PRN
  Administered 2020-12-17: 75 mL via INTRAVENOUS

## 2020-12-17 NOTE — ED Triage Notes (Signed)
Pt from [facility] after she sustained a fall around 5pm. Per EMS, pt with large hematoma to lower back area and c/o pain to that area.

## 2020-12-17 NOTE — ED Notes (Signed)
Pt up to bathroom,returned to bed. Urine sample obtained.

## 2020-12-17 NOTE — ED Notes (Signed)
Patient ambulated to restroom with hands on assistance. Patient able to give urine specimen. Patient assisted back to bed.

## 2020-12-17 NOTE — ED Provider Notes (Signed)
[facility] EMERGENCY DEPARTMENT Provider Note   CSN: [id number] Arrival date & time: 12/17/20  2033     History Chief Complaint  Patient presents with  . Fall    Kelly Freeman is a 87 y.o. female.  Patient with history of atrial fibrillation on Eliquis, complaining of a fall.  States that she lost her balance and fell.  Denies loss of consciousness.  Complaining of persistent lower back pain and swelling at the region.  Patient denies head injury or headache or neck pain.  Denies any other extremity pain.  She noticed swelling in her lower back as well.  Denies fevers cough vomiting or diarrhea.        Past Medical History:  Diagnosis Date  . Acute cystitis without hematuria   . Acute diarrhea   . Chronic allergic rhinitis   . CKD (chronic kidney disease) stage 3, GFR 30-59 ml/min (HCC)   . Cognitive impairment   . DDD (degenerative disc disease), lumbar   . Dizziness   . E. coli UTI   . Fever   . Gastroenteritis   . Hereditary and idiopathic peripheral neuropathy   . History of recurrent UTIs   . Hypertension   . Hypertension   . MGUS (monoclonal gammopathy of unknown significance)   . Migraine without aura and without status migrainosus, not intractable   . Mixed incontinence urge and stress   . Moderate episode of recurrent major depressive disorder ([place])   . Nausea and vomiting   . Obesity   . Obstructive sleep apnea   . Osteopenia   . Recurrent falls   . Restless leg syndrome   . Type 2 diabetes mellitus with kidney complication, with long-term current use of insulin ([place])   . UTI (urinary tract infection)     Patient Active Problem List   Diagnosis Date Noted  . Type 2 diabetes mellitus with kidney complication, with long-term current use of insulin ([place])   . Recurrent falls   . Moderate episode of recurrent major depressive disorder ([place])   . MGUS (monoclonal gammopathy of unknown significance)   . Hypertension   . History of recurrent UTIs   . CKD  (chronic kidney disease) stage 3, GFR 30-59 ml/min (HCC)     Past Surgical History:  Procedure Laterality Date  . APPENDECTOMY    . CHOLECYSTECTOMY       OB History   No obstetric history on file.     Family History  Problem Relation Age of Onset  . Thrombosis Father 47       Coronary  . Stomach cancer Mother 43  . Arthritis Son 15  . Arthritis Daughter 89  . Cirrhosis Daughter 32       Stage 3   . Hepatitis C Daughter     Social History   Tobacco Use  . Smoking status: Never Smoker  . Smokeless tobacco: Never Used  Vaping Use  . Vaping Use: Never used  Substance Use Topics  . Alcohol use: Never  . Drug use: Never    Home Medications Prior to Admission medications   Medication Sig Start Date End Date Taking? Authorizing Provider  acetaminophen (TYLENOL) 650 MG CR tablet Take 650 mg by mouth every 8 (eight) hours as needed for pain.    [provider]  Cephalexin (KEFLEX PO) Take by mouth. Has not started    [provider]  divalproex (DEPAKOTE SPRINKLE) 125 MG capsule Take 1 capsule (125 mg total) by mouth 2 (  two) times daily. 09/23/19   Sharon Seller, NP  FUROSEMIDE PO Take by mouth. Patient hasn't started yet    [provider]  lisinopril (ZESTRIL) 2.5 MG tablet Take 1 tablet (2.5 mg total) by mouth daily. 09/23/19   Sharon Seller, NP  Melatonin 5 MG TABS Take 5 mg by mouth daily.    [provider]  QUEtiapine (SEROQUEL) 25 MG tablet Take 0.5 tablets (12.5 mg total) by mouth daily. 09/23/19   Sharon Seller, NP  traZODone (DESYREL) 50 MG tablet Take 1 tablet (50 mg total) by mouth at bedtime. 09/23/19   Sharon Seller, NP    Allergies    Levofloxacin and Gabapentin  Review of Systems   Review of Systems  Constitutional: Negative for fever.  HENT: Negative for ear pain.   Eyes: Negative for pain.  Respiratory: Negative for cough.   Cardiovascular: Negative for chest pain.  Gastrointestinal: Negative for  abdominal pain.  Genitourinary: Negative for flank pain.  Musculoskeletal: Positive for back pain.  Skin: Negative for rash.  Neurological: Negative for headaches.    Physical Exam Updated Vital Signs BP (!) 137/50 (BP Location: Right Arm)   Pulse (!) 58   Temp 99.4 F (37.4 C) (Oral)   Resp 18   Ht 5\' 7"  (1.702 m)   Wt 73 kg   SpO2 100%   BMI 25.21 kg/m   Physical Exam Constitutional:      General: She is not in acute distress.    Appearance: Normal appearance.  HENT:     Head: Normocephalic.     Nose: Nose normal.  Eyes:     Extraocular Movements: Extraocular movements intact.  Cardiovascular:     Rate and Rhythm: Normal rate.  Pulmonary:     Effort: Pulmonary effort is normal.  Musculoskeletal:        General: Normal range of motion.     Cervical back: Normal range of motion.     Comments: No C or T spine midline tenderness step-off noted.  L4-5 midline tenderness present.  L4-5 paraspinal swelling and hematoma palpable.  Neurological:     General: No focal deficit present.     Mental Status: She is alert. Mental status is at baseline.     Comments: No pain with range of motion of bilateral lower extremities and full range of motion and strength 5/5 bilateral lower extremities present.  No upper extremity deficit noted as well.     ED Results / Procedures / Treatments   Labs (all labs ordered are listed, but only abnormal results are displayed) Labs Reviewed  CBC WITH DIFFERENTIAL/PLATELET - Abnormal; Notable for the following components:      Result Value   RBC 3.74 (*)    Hemoglobin 11.1 (*)    HCT 35.4 (*)    All other components within normal limits  BASIC METABOLIC PANEL - Abnormal; Notable for the following components:   Glucose, Bld 111 (*)    BUN 54 (*)    Creatinine, Ser 1.65 (*)    GFR, Estimated 29 (*)    All other components within normal limits  CBG MONITORING, ED - Abnormal; Notable for the following components:   Glucose-Capillary 116 (*)     All other components within normal limits  URINALYSIS, ROUTINE W REFLEX MICROSCOPIC    EKG None  Radiology No results found.  Procedures Procedures   Medications Ordered in ED Medications  acetaminophen (TYLENOL) tablet 650 mg (650 mg Oral Given 12/17/20  2141)    ED Course  I have reviewed the triage vital signs and the nursing notes.  Pertinent labs & imaging results that were available during my care of the patient were reviewed by me and considered in my medical decision making (see chart for details).    MDM Rules/Calculators/A&P                          I was alerted by CT that patient's creatinine was elevated.  However I feel the benefits outweigh the risks and the patient in terms of receiving IV contrast due to her Eliquis use and back hematoma after fall.  Pending CT imaging.  Patient will be signed out to oncoming physician provider.  Final Clinical Impression(s) / ED Diagnoses Final diagnoses:  None    Rx / DC Orders ED Discharge Orders    None       Luna Fuse, MD 12/17/20 2314

## 2020-12-17 NOTE — ED Notes (Signed)
Pt transported to radiology.

## 2020-12-18 ENCOUNTER — Inpatient Hospital Stay (HOSPITAL_COMMUNITY): Payer: Medicare Other

## 2020-12-18 DIAGNOSIS — M7981 Nontraumatic hematoma of soft tissue: Secondary | ICD-10-CM | POA: Diagnosis present

## 2020-12-18 DIAGNOSIS — M25552 Pain in left hip: Secondary | ICD-10-CM | POA: Diagnosis not present

## 2020-12-18 DIAGNOSIS — I10 Essential (primary) hypertension: Secondary | ICD-10-CM | POA: Diagnosis not present

## 2020-12-18 DIAGNOSIS — Z66 Do not resuscitate: Secondary | ICD-10-CM | POA: Diagnosis present

## 2020-12-18 DIAGNOSIS — E669 Obesity, unspecified: Secondary | ICD-10-CM | POA: Diagnosis present

## 2020-12-18 DIAGNOSIS — F05 Delirium due to known physiological condition: Secondary | ICD-10-CM | POA: Diagnosis present

## 2020-12-18 DIAGNOSIS — Z86718 Personal history of other venous thrombosis and embolism: Secondary | ICD-10-CM

## 2020-12-18 DIAGNOSIS — S300XXA Contusion of lower back and pelvis, initial encounter: Secondary | ICD-10-CM | POA: Diagnosis present

## 2020-12-18 DIAGNOSIS — B961 Klebsiella pneumoniae [K. pneumoniae] as the cause of diseases classified elsewhere: Secondary | ICD-10-CM | POA: Diagnosis present

## 2020-12-18 DIAGNOSIS — R410 Disorientation, unspecified: Secondary | ICD-10-CM | POA: Diagnosis not present

## 2020-12-18 DIAGNOSIS — T148XXA Other injury of unspecified body region, initial encounter: Secondary | ICD-10-CM | POA: Diagnosis not present

## 2020-12-18 DIAGNOSIS — W010XXA Fall on same level from slipping, tripping and stumbling without subsequent striking against object, initial encounter: Secondary | ICD-10-CM | POA: Diagnosis present

## 2020-12-18 DIAGNOSIS — G319 Degenerative disease of nervous system, unspecified: Secondary | ICD-10-CM | POA: Diagnosis not present

## 2020-12-18 DIAGNOSIS — F331 Major depressive disorder, recurrent, moderate: Secondary | ICD-10-CM | POA: Diagnosis present

## 2020-12-18 DIAGNOSIS — J3489 Other specified disorders of nose and nasal sinuses: Secondary | ICD-10-CM | POA: Diagnosis not present

## 2020-12-18 DIAGNOSIS — D62 Acute posthemorrhagic anemia: Secondary | ICD-10-CM | POA: Diagnosis present

## 2020-12-18 DIAGNOSIS — R451 Restlessness and agitation: Secondary | ICD-10-CM | POA: Diagnosis present

## 2020-12-18 DIAGNOSIS — Z87440 Personal history of urinary (tract) infections: Secondary | ICD-10-CM | POA: Diagnosis not present

## 2020-12-18 DIAGNOSIS — G609 Hereditary and idiopathic neuropathy, unspecified: Secondary | ICD-10-CM | POA: Diagnosis present

## 2020-12-18 DIAGNOSIS — W19XXXA Unspecified fall, initial encounter: Secondary | ICD-10-CM

## 2020-12-18 DIAGNOSIS — N179 Acute kidney failure, unspecified: Secondary | ICD-10-CM | POA: Diagnosis present

## 2020-12-18 DIAGNOSIS — Z20822 Contact with and (suspected) exposure to covid-19: Secondary | ICD-10-CM | POA: Diagnosis present

## 2020-12-18 DIAGNOSIS — D631 Anemia in chronic kidney disease: Secondary | ICD-10-CM | POA: Diagnosis present

## 2020-12-18 DIAGNOSIS — G2581 Restless legs syndrome: Secondary | ICD-10-CM | POA: Diagnosis present

## 2020-12-18 DIAGNOSIS — I129 Hypertensive chronic kidney disease with stage 1 through stage 4 chronic kidney disease, or unspecified chronic kidney disease: Secondary | ICD-10-CM | POA: Diagnosis present

## 2020-12-18 DIAGNOSIS — N1832 Chronic kidney disease, stage 3b: Secondary | ICD-10-CM | POA: Diagnosis present

## 2020-12-18 DIAGNOSIS — D472 Monoclonal gammopathy: Secondary | ICD-10-CM | POA: Diagnosis present

## 2020-12-18 DIAGNOSIS — R52 Pain, unspecified: Secondary | ICD-10-CM | POA: Diagnosis not present

## 2020-12-18 DIAGNOSIS — G4700 Insomnia, unspecified: Secondary | ICD-10-CM | POA: Diagnosis present

## 2020-12-18 DIAGNOSIS — F0390 Unspecified dementia without behavioral disturbance: Secondary | ICD-10-CM | POA: Diagnosis present

## 2020-12-18 DIAGNOSIS — G4733 Obstructive sleep apnea (adult) (pediatric): Secondary | ICD-10-CM | POA: Diagnosis present

## 2020-12-18 DIAGNOSIS — E1122 Type 2 diabetes mellitus with diabetic chronic kidney disease: Secondary | ICD-10-CM | POA: Diagnosis present

## 2020-12-18 DIAGNOSIS — Y92009 Unspecified place in unspecified non-institutional (private) residence as the place of occurrence of the external cause: Secondary | ICD-10-CM | POA: Diagnosis not present

## 2020-12-18 DIAGNOSIS — N390 Urinary tract infection, site not specified: Secondary | ICD-10-CM | POA: Diagnosis present

## 2020-12-18 DIAGNOSIS — J309 Allergic rhinitis, unspecified: Secondary | ICD-10-CM | POA: Diagnosis present

## 2020-12-18 DIAGNOSIS — E1142 Type 2 diabetes mellitus with diabetic polyneuropathy: Secondary | ICD-10-CM | POA: Diagnosis present

## 2020-12-18 DIAGNOSIS — R296 Repeated falls: Secondary | ICD-10-CM | POA: Diagnosis present

## 2020-12-18 DIAGNOSIS — Z515 Encounter for palliative care: Secondary | ICD-10-CM | POA: Diagnosis not present

## 2020-12-18 LAB — CBC
HCT: 29.5 % — ABNORMAL LOW (ref 36.0–46.0)
Hemoglobin: 9.3 g/dL — ABNORMAL LOW (ref 12.0–15.0)
MCH: 29.5 pg (ref 26.0–34.0)
MCHC: 31.5 g/dL (ref 30.0–36.0)
MCV: 93.7 fL (ref 80.0–100.0)
Platelets: 158 10*3/uL (ref 150–400)
RBC: 3.15 MIL/uL — ABNORMAL LOW (ref 3.87–5.11)
RDW: 13.4 % (ref 11.5–15.5)
WBC: 11.3 10*3/uL — ABNORMAL HIGH (ref 4.0–10.5)
nRBC: 0 % (ref 0.0–0.2)

## 2020-12-18 LAB — HEMOGLOBIN AND HEMATOCRIT, BLOOD
HCT: 30.4 % — ABNORMAL LOW (ref 36.0–46.0)
HCT: 26.2 % — ABNORMAL LOW (ref 36.0–46.0)
Hemoglobin: 9.6 g/dL — ABNORMAL LOW (ref 12.0–15.0)
Hemoglobin: 8.5 g/dL — ABNORMAL LOW (ref 12.0–15.0)

## 2020-12-18 LAB — HEPATITIS B SURFACE ANTIGEN: Hepatitis B Surface Ag: NONREACTIVE

## 2020-12-18 LAB — RESP PANEL BY RT-PCR (FLU A&B, COVID) ARPGX2
Influenza A by PCR: NEGATIVE
Influenza B by PCR: NEGATIVE
SARS Coronavirus 2 by RT PCR: NEGATIVE

## 2020-12-18 LAB — TYPE AND SCREEN
ABO/RH(D): O POS
Antibody Screen: NEGATIVE

## 2020-12-18 LAB — GLUCOSE, CAPILLARY: Glucose-Capillary: 131 mg/dL — ABNORMAL HIGH (ref 70–99)

## 2020-12-18 LAB — BASIC METABOLIC PANEL
Anion gap: 9 (ref 5–15)
BUN: 45 mg/dL — ABNORMAL HIGH (ref 8–23)
CO2: 24 mmol/L (ref 22–32)
Calcium: 8.8 mg/dL — ABNORMAL LOW (ref 8.9–10.3)
Chloride: 104 mmol/L (ref 98–111)
Creatinine, Ser: 1.52 mg/dL — ABNORMAL HIGH (ref 0.44–1.00)
GFR, Estimated: 32 mL/min — ABNORMAL LOW (ref >60)
Glucose, Bld: 112 mg/dL — ABNORMAL HIGH (ref 70–99)
Potassium: 4.4 mmol/L (ref 3.5–5.1)
Sodium: 137 mmol/L (ref 135–145)

## 2020-12-18 LAB — PROTIME-INR
INR: 1.2 (ref 0.8–1.2)
Prothrombin Time: 15.6 seconds — ABNORMAL HIGH (ref 11.4–15.2)

## 2020-12-18 LAB — ABO/RH: ABO/RH(D): O POS

## 2020-12-18 MED ORDER — ACETAMINOPHEN 325 MG PO TABS
650.0000 mg | ORAL_TABLET | Freq: Three times a day (TID) | ORAL | Status: DC
  Administered 2020-12-18 – 2020-12-19 (×2): 650 mg via ORAL
  Filled 2020-12-18 (×2): qty 2

## 2020-12-18 MED ORDER — ACETAMINOPHEN 325 MG PO TABS
650.0000 mg | ORAL_TABLET | Freq: Four times a day (QID) | ORAL | Status: DC | PRN
  Administered 2020-12-22 (×2): 650 mg via ORAL
  Filled 2020-12-18 (×2): qty 2

## 2020-12-18 MED ORDER — PROTHROMBIN COMPLEX CONC HUMAN 500 UNITS IV KIT
3191.0000 [IU] | PACK | Status: AC
  Administered 2020-12-18: 3191 [IU] via INTRAVENOUS
  Filled 2020-12-18: qty 3191

## 2020-12-18 MED ORDER — TRAMADOL HCL 50 MG PO TABS
50.0000 mg | ORAL_TABLET | Freq: Two times a day (BID) | ORAL | Status: DC | PRN
  Administered 2020-12-19: 50 mg via ORAL
  Filled 2020-12-18: qty 1

## 2020-12-18 MED ORDER — METOPROLOL TARTRATE 5 MG/5ML IV SOLN: 2.5000 mg | Freq: Three times a day (TID) | INTRAVENOUS | Status: DC

## 2020-12-18 MED ORDER — HALOPERIDOL LACTATE 5 MG/ML IJ SOLN
4.0000 mg | Freq: Once | INTRAMUSCULAR | Status: AC
  Administered 2020-12-18: 4 mg via INTRAMUSCULAR
  Filled 2020-12-18: qty 1

## 2020-12-18 MED ORDER — LIDOCAINE 5 % EX PTCH
1.0000 | MEDICATED_PATCH | CUTANEOUS | Status: DC
  Administered 2020-12-18 – 2020-12-22 (×5): 1 via TRANSDERMAL
  Filled 2020-12-18 (×5): qty 1

## 2020-12-18 MED ORDER — HYDRALAZINE HCL 20 MG/ML IJ SOLN: 10.0000 mg | Freq: Four times a day (QID) | INTRAMUSCULAR | Status: DC | PRN

## 2020-12-18 MED ORDER — METOPROLOL TARTRATE 5 MG/5ML IV SOLN: 2.5000 mg | Freq: Three times a day (TID) | INTRAVENOUS | Status: DC | PRN

## 2020-12-18 MED ORDER — DEXTROSE-NACL 5-0.9 % IV SOLN: INTRAVENOUS | Status: DC

## 2020-12-18 MED ORDER — SODIUM CHLORIDE 0.9 % IV SOLN
1.0000 g | INTRAVENOUS | Status: DC
  Administered 2020-12-18 – 2020-12-20 (×3): 1 g via INTRAVENOUS
  Filled 2020-12-18 (×2): qty 1
  Filled 2020-12-18: qty 10
  Filled 2020-12-18: qty 1

## 2020-12-18 MED ORDER — DIVALPROEX SODIUM 125 MG PO CSDR
125.0000 mg | DELAYED_RELEASE_CAPSULE | Freq: Two times a day (BID) | ORAL | Status: DC
  Administered 2020-12-18 – 2020-12-22 (×8): 125 mg via ORAL
  Filled 2020-12-18 (×8): qty 1

## 2020-12-18 MED ORDER — OLANZAPINE 5 MG PO TBDP
2.5000 mg | ORAL_TABLET | Freq: Two times a day (BID) | ORAL | Status: DC | PRN
  Filled 2020-12-18: qty 0.5

## 2020-12-18 MED ORDER — MELATONIN 5 MG PO TABS
5.0000 mg | ORAL_TABLET | Freq: Every day | ORAL | Status: DC
  Administered 2020-12-18 – 2020-12-21 (×4): 5 mg via ORAL
  Filled 2020-12-18 (×4): qty 1

## 2020-12-18 MED ORDER — LISINOPRIL 2.5 MG PO TABS: 2.5000 mg | ORAL_TABLET | Freq: Every day | ORAL | Status: DC

## 2020-12-18 MED ORDER — TRAZODONE HCL 50 MG PO TABS
50.0000 mg | ORAL_TABLET | Freq: Every day | ORAL | Status: DC
  Administered 2020-12-18 – 2020-12-21 (×4): 50 mg via ORAL
  Filled 2020-12-18 (×4): qty 1

## 2020-12-18 MED ORDER — LORAZEPAM 2 MG/ML IJ SOLN
1.0000 mg | Freq: Once | INTRAMUSCULAR | Status: AC
  Administered 2020-12-18: 1 mg via INTRAVENOUS
  Filled 2020-12-18: qty 1

## 2020-12-18 MED ORDER — HALOPERIDOL LACTATE 5 MG/ML IJ SOLN
2.0000 mg | Freq: Once | INTRAMUSCULAR | Status: AC
  Administered 2020-12-18: 2 mg via INTRAVENOUS
  Filled 2020-12-18: qty 1

## 2020-12-18 NOTE — H&P (Signed)
History and Physical  Kelly Freeman WUJ:[id number] DOB: [birth year] DOA: 12/17/2020  Referring physician: Ezequiel Essex, MD PCP: Sharion Balloon, FNP  Patient coming from: SNF [facility] point, [place])  Chief Complaint: Fall and Low back pain  HPI: Kelly Freeman is a 87 y.o. female with medical history significant for dementia, hypertension, recurrent UTI, CKD stage III, depression and history of DVT on Xarelto who presents to the emergency department via EMS from SNF due to a fall.  Patient was unable to provide history, history was obtained from ED physician, daughter and granddaughter at bedside.  Per granddaughter, patient ambulates with a walker at baseline, she thought that there was a chair behind her, so she was trying to sit, she lost her balance and fell landing on the floor with her buttocks.  She complains of persistent lower back pain and swelling at the region, but she denies hitting her head, headache or or sustaining any neck injury.  Patient denies chest pain, shortness of breath, fever, chills, cough or vomiting.  ED Course:  In the emergency department, patient was hemodynamically stable, work-up done showed normocytic anemia, BUN to creatinine 54/1.65 (baseline creatinine at 1.2-1.5) CT abdomen and pelvis with contrast showed large soft tissue hematoma ( 8.6 x 32.6 x 3.9 cm) in the subcutaneous fat over the low back with areas of increased density within the hematoma which may represent sentinel clot or focal areas of active extravasation. Tylenol was given, patient was given IV ceftriaxone due to presumed UTI. Dr. Dwaine Gale of interventional radiology was consulted on phone and there was no role for any IR intervention at this time since hematoma is subcutaneous and most likely venous bleeds.  Anticoagulation reversal (due to patient being on Xarelto) with Eppie Gibson was planned.  Hospitalist was asked to admit patient for further evaluation and management. After discussing with the ED  physician, it was agreed for patient to be admitted to [facility] due to access to specialists-IR, vascular surgery if needed.  Considering that [facility] does not have this provision over the weekend.  This was discussed with daughter and granddaughter at bedside and they agreed for patient to be admitted to [facility].   Review of Systems: Constitutional: Negative for chills and fever.  HENT: Negative for ear pain and sore throat.   Eyes: Negative for pain and visual disturbance.  Respiratory: Negative for cough, chest tightness and shortness of breath.   Cardiovascular: Negative for chest pain and palpitations.  Gastrointestinal: Negative for abdominal pain and vomiting.  Endocrine: Negative for polyphagia and polyuria.  Genitourinary: Negative for decreased urine volume, dysuria Musculoskeletal: Positive for low back pain. Skin: Negative for color change and rash.  Allergic/Immunologic: Negative for immunocompromised state.  Neurological: Negative for tremors, syncope, speech difficulty Hematological: Does not bruise/bleed easily.  All other systems reviewed and are negative   Past Medical History:  Diagnosis Date  . Acute cystitis without hematuria   . Acute diarrhea   . Chronic allergic rhinitis   . CKD (chronic kidney disease) stage 3, GFR 30-59 ml/min (HCC)   . Cognitive impairment   . DDD (degenerative disc disease), lumbar   . Dizziness   . E. coli UTI   . Fever   . Gastroenteritis   . Hereditary and idiopathic peripheral neuropathy   . History of recurrent UTIs   . Hypertension   . Hypertension   . MGUS (monoclonal gammopathy of unknown significance)   . Migraine without aura and without status migrainosus, not intractable   .  Mixed incontinence urge and stress   . Moderate episode of recurrent major depressive disorder ([place])   . Nausea and vomiting   . Obesity   . Obstructive sleep apnea   . Osteopenia   . Recurrent falls   . Restless leg syndrome   . Type 2  diabetes mellitus with kidney complication, with long-term current use of insulin ([place])   . UTI (urinary tract infection)    Past Surgical History:  Procedure Laterality Date  . APPENDECTOMY    . CHOLECYSTECTOMY      Social History:  reports that she has never smoked. She has never used smokeless tobacco. She reports that she does not drink alcohol and does not use drugs.   Allergies  Allergen Reactions  . Levofloxacin Rash  . Gabapentin     Other reaction(s): Confusion    Family History  Problem Relation Age of Onset  . Thrombosis Father 51       Coronary  . Stomach cancer Mother 73  . Arthritis Son 12  . Arthritis Daughter 19  . Cirrhosis Daughter 43       Stage 3   . Hepatitis C Daughter     Prior to Admission medications   Medication Sig Start Date End Date Taking? Authorizing Provider  acetaminophen (TYLENOL) 650 MG CR tablet Take 650 mg by mouth every 8 (eight) hours as needed for pain.    [provider]  Cephalexin (KEFLEX PO) Take by mouth. Has not started    [provider]  divalproex (DEPAKOTE SPRINKLE) 125 MG capsule Take 1 capsule (125 mg total) by mouth 2 (two) times daily. 09/23/19   Lauree Chandler, NP  FUROSEMIDE PO Take by mouth. Patient hasn't started yet    [provider]  lisinopril (ZESTRIL) 2.5 MG tablet Take 1 tablet (2.5 mg total) by mouth daily. 09/23/19   Lauree Chandler, NP  Melatonin 5 MG TABS Take 5 mg by mouth daily.    [provider]  QUEtiapine (SEROQUEL) 25 MG tablet Take 0.5 tablets (12.5 mg total) by mouth daily. 09/23/19   Lauree Chandler, NP  traZODone (DESYREL) 50 MG tablet Take 1 tablet (50 mg total) by mouth at bedtime. 09/23/19   Lauree Chandler, NP    Physical Exam: BP (!) 127/51   Pulse (!) 57   Temp 99.4 F (37.4 C) (Oral)   Resp 18   Ht 5\' 7"  (1.702 m)   Wt 73 kg   SpO2 99%   BMI 25.21 kg/m   . General: 87 y.o. year-old female well developed and in no acute distress.   Alert and oriented x3. Marland Kitchen HEENT: NCAT, EOMI . Neck: Supple, trachea medial . Cardiovascular: Regular rate and rhythm with no rubs or gallops.  No thyromegaly or JVD noted.  2/4 pulses in all 4 extremities. Marland Kitchen Respiratory: Clear to auscultation with no wheezes or rales. Good inspiratory effort. . Abdomen: Soft nontender nondistended with normal bowel sounds x4 quadrants. . Muskuloskeletal: Palpable L4-L5 paraspinal swelling and hematoma.  Tender to palpation of L4-L5.  No clubbing noted bilaterally . Neuro: No focal neurologic deficit, negative straight leg raise  . Skin: No ulcerative lesions noted or rashes . Psychiatry:  Mood is appropriate for condition and setting          Labs on Admission:  Basic Metabolic Panel: Recent Labs  Lab 12/17/20 2200  NA 136  K 4.2  CL 101  CO2 28  GLUCOSE 111*  BUN 54*  CREATININE 1.65*  CALCIUM 8.9   Liver Function Tests: No results for input(s): AST, ALT, ALKPHOS, BILITOT, PROT, ALBUMIN in the last 168 hours. No results for input(s): LIPASE, AMYLASE in the last 168 hours. No results for input(s): AMMONIA in the last 168 hours. CBC: Recent Labs  Lab 12/17/20 2200 12/18/20 0129  WBC 8.7  --   NEUTROABS 6.0  --   HGB 11.1* 9.6*  HCT 35.4* 30.4*  MCV 94.7  --   PLT 161  --    Cardiac Enzymes: No results for input(s): CKTOTAL, CKMB, CKMBINDEX, TROPONINI in the last 168 hours.  BNP (last 3 results) No results for input(s): BNP in the last 8760 hours.  ProBNP (last 3 results) No results for input(s): PROBNP in the last 8760 hours.  CBG: Recent Labs  Lab 12/17/20 2114  GLUCAP 116*    Radiological Exams on Admission: CT Abdomen Pelvis W Contrast  Result Date: 12/18/2020 CLINICAL DATA:  Diffuse back pain and flank swelling after a fall. EXAM: CT ABDOMEN AND PELVIS WITH CONTRAST TECHNIQUE: Multidetector CT imaging of the abdomen and pelvis was performed using the standard protocol following bolus administration of intravenous  contrast. CONTRAST:  84mL OMNIPAQUE IOHEXOL 300 MG/ML  SOLN COMPARISON:  None. FINDINGS: Lower chest: Motion artifact limits evaluation. Mild dependent changes in the lung bases. Cardiac enlargement. Coronary artery and aortic calcification. Hepatobiliary: Multiple circumscribed low-attenuation lesions throughout the liver are likely cysts. Largest measures about 1.8 cm in diameter. Surgical absence of the gallbladder. Mild bile duct dilatation is likely physiologic post cholecystectomy. Pancreas: Unremarkable. No pancreatic ductal dilatation or surrounding inflammatory changes. Spleen: Normal in size without focal abnormality. Adrenals/Urinary Tract: Adrenal glands are unremarkable. Kidneys are normal, without renal calculi, focal solid lesion, or hydronephrosis. Bladder is unremarkable. Bilateral renal cysts. Stomach/Bowel: Stomach is within normal limits. Appendix appears normal. No evidence of bowel wall thickening, distention, or inflammatory changes. Vascular/Lymphatic: Prominent aortic and iliac vascular calcifications. Right iliac artery is dilated, measuring 1.4 cm diameter. No significant lymphadenopathy in the abdomen. Reproductive: Uterus and ovaries are not enlarged. Other: No free air or free fluid in the abdomen. Abdominal wall musculature appears intact. Stents of area of increased density in the subcutaneous fat over the low back, extending from right to left, and measuring about 8.6 x 32.6 x 3.9 cm consistent with soft tissue hematoma. Areas of increased density within the hematoma may represent sentinel clot or focal areas of active extravasation. Musculoskeletal: Degenerative changes in the spine and hips. Anterior compression of T11 appears chronic. No acute displaced fractures are identified. IMPRESSION: 1. Large soft tissue hematoma in the subcutaneous fat over the low back. Areas of increased density within the hematoma may represent sentinel clot or focal areas of active extravasation. 2. No  evidence of bowel obstruction or inflammation. 3. Multiple hepatic and renal cysts. 4. Prominent aortic and iliac vascular calcifications. 5. Anterior compression of T11 appears chronic. No acute displaced fractures are identified. Aortic Atherosclerosis (ICD10-I70.0). Critical Value/emergent results were called by telephone at the time of interpretation on 12/18/2020 at 12:16 am to provider Dr. Wyvonnia Dusky, Who verbally acknowledged these results. Electronically Signed   By: Lucienne Capers M.D.   On: 12/18/2020 00:18    EKG: I independently viewed the EKG done and my findings are as followed: No EKG was done in the ED  Assessment/Plan Present on Admission: . Hematoma, nontraumatic, soft tissue . History of recurrent UTIs . Hypertension  Principal Problem:   Hematoma, nontraumatic, soft tissue Active Problems:  Hypertension   History of recurrent UTIs   Fall at home, initial encounter   History of DVT (deep vein thrombosis)  Large soft tissue hematoma CT abdomen and pelvis with contrast showed large soft tissue hematoma ( 8.6 x 32.6 x 3.9 cm) in the subcutaneous fat over the low back  Patient was on Xarelto due to DVT; Kcentra was given in the ED Hemoglobin since arrival to the ED, 42.6 > 9.6 It is uncertain if patient continues to bleed into the subcutaneous tissues at this time She is currently hemodynamically stable, she will be admitted to [facility] in case she will need further intervention. Continue to monitor H/H  Accidental fall  Low back pain Continue Tylenol as needed for mild pain Continue tramadol as needed for moderate/severe pain Continue fall precaution and neurochecks Continue PT/OT eval and treat  History of frequent UTI Granddaughter at bedside endorsed history of recurrent UTI Urinalysis done in the ED showed small leukocytes, 21-50 WBC and many bacteria Patient was unable to tell me if she has any irritative bladder symptoms Patient was empirically started on  IV ceftriaxone, we shall continue with same with plan to de-escalate/discontinue based on urine culture  History of DVT Xarelto will be held at this time; last dose was on 4/22  Essential hypertension Continue lisinopril per home regimen  Insomnia Continue home melatonin  Dementia Patient was not on any home medication, we shall await updated med rec  DVT prophylaxis: SCD  Code Status: Full code  Family Communication: Daughter and granddaughter at bedside (all questions answered to satisfaction)  Disposition Plan:  Patient is from:                        SNF Anticipated DC to:                   SNF Anticipated DC date:               2-3 days Anticipated DC barriers:         inpatient management required due to large soft tissue hematoma requiring reversal of anticoagulant   Consults called: None  Admission status: Inpatient    Bernadette Hoit MD [facility]  12/18/2020, 2:49 AM

## 2020-12-18 NOTE — Evaluation (Signed)
Physical Therapy Evaluation Patient Details Name: Kelly Freeman MRN: [id number] DOB: [birth year] Today's Date: 12/18/2020   History of Present Illness  Pt is a 87 y.o. F admitted from SNF after a fall with large soft tissue hematoma over low back. Significant PMH: dementia, HTN, recurrent UTI, CKD stage III, depression, DVT.  Clinical Impression  Pt alert and conversant; however, she is extremely restless and agitated and in four point restraints. Pt unable to follow any one step commands. RN reports pt given Haldol just prior to session. PT opened blinds for natural light and played classical music to promote relaxation. Pt requiring moderate assist for attempts to come into long sitting, however, she quickly lies back down due to back pain. Pt resistive to attempts at rolling or sitting up on the side of the bed, even with distraction and/or encouragement. Recommend return to SNF at discharge.     Follow Up Recommendations SNF    Equipment Recommendations  None recommended by PT    Recommendations for Other Services       Precautions / Restrictions Precautions Precautions: Fall;Other (comment) Precaution Comments: 4 point restraints Restrictions Weight Bearing Restrictions: No      Mobility  Bed Mobility Overal bed mobility: Needs Assistance Bed Mobility: Supine to Sit     Supine to sit: Mod assist     General bed mobility comments: Pt able to progress into long sitting position with modA. Resistive to rolling attempts or bringing BLE's off edge of bed    Transfers                    Ambulation/Gait                Stairs            Wheelchair Mobility    Modified Rankin (Stroke Patients Only)       Balance                                             Pertinent Vitals/Pain Pain Assessment: Faces Faces Pain Scale: Hurts even more Pain Location: back Pain Descriptors / Indicators: Guarding;Grimacing Pain Intervention(s):  Limited activity within patient's tolerance;Monitored during session    Home Living Family/patient expects to be discharged to:: Skilled nursing facility                      Prior Function Level of Independence: Needs assistance   Gait / Transfers Assistance Needed: using walker per chart review  ADL's / Homemaking Assistance Needed: likely requiring assist from SNF staff        Hand Dominance        Extremity/Trunk Assessment   Upper Extremity Assessment Upper Extremity Assessment: RUE deficits/detail;LUE deficits/detail RUE Deficits / Details: PROM WFL LUE Deficits / Details: PROM WFL    Lower Extremity Assessment Lower Extremity Assessment: RLE deficits/detail;LLE deficits/detail RLE Deficits / Details: Difficult to assess due to cognition; pt actively flexing knee LLE Deficits / Details: Difficult to assess due to cognition; pt actively flexing knee       Communication   Communication: No difficulties  Cognition Arousal/Alertness: Awake/alert Behavior During Therapy: Restless;Agitated Overall Cognitive Status: History of cognitive impairments - at baseline  General Comments: Pt with dementia at baseline; restless and agitated. She is conversant, but does not follow one step commands. Frequently asks for "ma ma," and "paw paw," to come help her. Pt stating, "can you carry me out of here?"      General Comments General comments (skin integrity, edema, etc.): Left elbow abrasion with min sanguineous drainage (pt would not allow RN to don foam dressing).    Exercises     Assessment/Plan    PT Assessment Patient needs continued PT services  PT Problem List Decreased strength;Decreased activity tolerance;Decreased balance;Decreased mobility;Decreased cognition;Decreased safety awareness;Pain       PT Treatment Interventions Gait training;Functional mobility training;Therapeutic activities;Therapeutic  exercise;Balance training;Patient/family education;Wheelchair mobility training    PT Goals (Current goals can be found in the Care Plan section)  Acute Rehab PT Goals Patient Stated Goal: unable PT Goal Formulation: Patient unable to participate in goal setting Time For Goal Achievement: 01/01/21 Potential to Achieve Goals: Fair    Frequency Min 3X/week   Barriers to discharge        Co-evaluation               AM-PAC PT "6 Clicks" Mobility  Outcome Measure Help needed turning from your back to your side while in a flat bed without using bedrails?: Total Help needed moving from lying on your back to sitting on the side of a flat bed without using bedrails?: Total Help needed moving to and from a bed to a chair (including a wheelchair)?: Total Help needed standing up from a chair using your arms (e.g., wheelchair or bedside chair)?: Total Help needed to walk in hospital room?: Total Help needed climbing 3-5 steps with a railing? : Total 6 Click Score: 6    End of Session   Activity Tolerance: Other (comment) (limited by confusion) Patient left: in bed;with call bell/phone within reach;with bed alarm set;with restraints reapplied Nurse Communication: Mobility status PT Visit Diagnosis: Pain Pain - part of body:  (back)    Time: 7062-3762 PT Time Calculation (min) (ACUTE ONLY): 21 min   Charges:   PT Evaluation $PT Eval Moderate Complexity: [place], PT, DPT [place] Pager [phone] Office [phone]   Deno Etienne 12/18/2020, 12:49 PM

## 2020-12-18 NOTE — Progress Notes (Signed)
Pt remains in bilateral non-violent wrist and bilateral non-violent ankle restraints due to agitation and pulling tubes, lines, monitors and devices. Pt is unable to be redirected. Pt skin assessed and no abnomalities noted. ROM exercised performed. Pt has external catheter in place and offered food and water.

## 2020-12-18 NOTE — Progress Notes (Addendum)
PROGRESS NOTE    Kelly Freeman  [id number]  DOB: 17-Apr-1927  PCP: Sharion Balloon, FNP Admit date:12/17/2020 Chief compliant: Back pain, fall at SNF 87 y.o. female with medical history significant for dementia, hypertension, recurrent UTI, CKD stage III, depression and history of DVT on Xarelto who presents to the [facility] ED via EMS from SNF due to back pain after a mechanical fall (please see H&P for details). Per H&P, granddaughter reported that patient fell on buttocks while trying to position her into a chair, patient ambulates with a walker at baseline.  No reported head injury. ED Course: Afebrile,normocytic anemia, BUN to creatinine 54/1.65 (baseline creatinine at 1.2-1.5).CT abdomen and pelvis with contrast showed large soft tissue hematoma ( 8.6 x 32.6 x 3.9 cm) in the subcutaneous fat over the low back with areas of increased density within the hematoma which may represent sentinel clot or focal areas of active extravasation.Tylenol was given, patient was given IV ceftriaxone due to presumed UTI.Dr. Dwaine Gale of interventional radiology was consulted on phone and there was no role for any IR intervention at this time since hematoma is subcutaneous and most likely venous bleeds. Anticoagulation reversal (due to patient being on Xarelto) with Eppie Gibson was planned.  Hospitalist was asked to admit patient for further evaluation and management. Hospital course: Patient received Kcentra in the ED and transferred to [facility] in case of worsening and needing IR/vascular surgery interventions. Patient receiving IV ceftriaxone for presumed UTI.  Subjective:Patient very confused this morning and agitated trying to get out of the bed.  Restraints in place, bedside RN requesting as needed meds for anxiety/agitation.  Patient repeatedly stating that she wants to get out of here.  She is unable to tell me the place she is at.  Disoriented x3.  Objective: Vitals:   12/18/20 0500 12/18/20 0700 12/18/20 1135  12/18/20 1500  BP: (!) 134/94 (!) 150/76 (!) 171/96 (!) 147/87  Pulse: 85 (!) 101 (!) 104 (!) 108  Resp: 20 (!) 25 (!) 27   Temp: 99.6 F (37.6 C) 97.6 F (36.4 C) 97.6 F (36.4 C) 97.9 F (36.6 C)  TempSrc: Oral Oral Oral Oral  SpO2: 100% 100% 100% 100%  Weight: 66.7 kg     Height:        Intake/Output Summary (Last 24 hours) at 12/18/2020 1640 Last data filed at 12/18/2020 1300 Gross per 24 hour  Intake 480 ml  Output 100 ml  Net 380 ml   Filed Weights   12/17/20 2043 12/18/20 0500  Weight: 73 kg 66.7 kg    Physical Examination:  General: Moaning in pain, anxious trying to get out of bed, has restraints, no respiratory distress  Head ENT: Atraumatic normocephalic, PERRLA, neck supple Heart: S1-S2 heard, regular rate and rhythm, no murmurs.  No leg edema noted Lungs: Equal air entry bilaterally, no rhonchi or rales on exam, no accessory muscle use Abdomen: Bowel sounds heard, soft, nontender, nondistended.  Extremities: Moving all extremities, no pedal edema.   Neurological: Awake alert oriented x3, no focal weakness or numbness, strength and sensations to crude touch intact Skin: Superficial ecchymosis, paraspinal swelling around L4-L5 (not  cooperative to exam)         Data Reviewed: I have personally reviewed following labs and imaging studies  CBC: Recent Labs  Lab 12/17/20 2200 12/18/20 0129 12/18/20 0858  WBC 8.7  --  11.3*  NEUTROABS 6.0  --   --   HGB 11.1* 9.6* 9.3*  HCT 35.4* 30.4* 29.5*  MCV 94.7  --  93.7  PLT 161  --  188   Basic Metabolic Panel: Recent Labs  Lab 12/17/20 2200 12/18/20 0858  NA 136 137  K 4.2 4.4  CL 101 104  CO2 28 24  GLUCOSE 111* 112*  BUN 54* 45*  CREATININE 1.65* 1.52*  CALCIUM 8.9 8.8*   GFR: Estimated Creatinine Clearance: 23.4 mL/min (A) (by C-G formula based on SCr of 1.52 mg/dL (H)). Liver Function Tests: No results for input(s): AST, ALT, ALKPHOS, BILITOT, PROT, ALBUMIN in the last 168 hours. No  results for input(s): LIPASE, AMYLASE in the last 168 hours. No results for input(s): AMMONIA in the last 168 hours. Coagulation Profile: Recent Labs  Lab 12/18/20 0129  INR 1.2   Cardiac Enzymes: No results for input(s): CKTOTAL, CKMB, CKMBINDEX, TROPONINI in the last 168 hours. BNP (last 3 results) No results for input(s): PROBNP in the last 8760 hours. HbA1C: No results for input(s): HGBA1C in the last 72 hours. CBG: Recent Labs  Lab 12/17/20 2114 12/18/20 1626  GLUCAP 116* 131*   Lipid Profile: No results for input(s): CHOL, HDL, LDLCALC, TRIG, CHOLHDL, LDLDIRECT in the last 72 hours. Thyroid Function Tests: No results for input(s): TSH, T4TOTAL, FREET4, T3FREE, THYROIDAB in the last 72 hours. Anemia Panel: No results for input(s): VITAMINB12, FOLATE, FERRITIN, TIBC, IRON, RETICCTPCT in the last 72 hours. Sepsis Labs: No results for input(s): PROCALCITON, LATICACIDVEN in the last 168 hours.  Recent Results (from the past 240 hour(s))  Resp Panel by RT-PCR (Flu A&B, Covid) Nasopharyngeal Swab     Status: None   Collection Time: 12/18/20  1:05 AM   Specimen: Nasopharyngeal Swab; Nasopharyngeal(NP) swabs in vial transport medium  Result Value Ref Range Status   SARS Coronavirus 2 by RT PCR NEGATIVE NEGATIVE Final    Comment: (NOTE) SARS-CoV-2 target nucleic acids are NOT DETECTED.  The SARS-CoV-2 RNA is generally detectable in upper respiratory specimens during the acute phase of infection. The lowest concentration of SARS-CoV-2 viral copies this assay can detect is 138 copies/mL. A negative result does not preclude SARS-Cov-2 infection and should not be used as the sole basis for treatment or other patient management decisions. A negative result may occur with  improper specimen collection/handling, submission of specimen other than nasopharyngeal swab, presence of viral mutation(s) within the areas targeted by this assay, and inadequate number of viral copies(<138  copies/mL). A negative result must be combined with clinical observations, patient history, and epidemiological information. The expected result is Negative.  Fact Sheet for Patients:  EntrepreneurPulse.com.au  Fact Sheet for Healthcare Providers:  IncredibleEmployment.be  This test is no t yet approved or cleared by the [place] FDA and  has been authorized for detection and/or diagnosis of SARS-CoV-2 by FDA under an Emergency Use Authorization (EUA). This EUA will remain  in effect (meaning this test can be used) for the duration of the COVID-19 declaration under Section 564(b)(1) of the Act, 21 U.S.C.section 360bbb-3(b)(1), unless the authorization is terminated  or revoked sooner.       Influenza A by PCR NEGATIVE NEGATIVE Final   Influenza B by PCR NEGATIVE NEGATIVE Final    Comment: (NOTE) The Xpert Xpress SARS-CoV-2/FLU/RSV plus assay is intended as an aid in the diagnosis of influenza from Nasopharyngeal swab specimens and should not be used as a sole basis for treatment. Nasal washings and aspirates are unacceptable for Xpert Xpress SARS-CoV-2/FLU/RSV testing.  Fact Sheet for Patients: EntrepreneurPulse.com.au  Fact Sheet for Healthcare Providers: IncredibleEmployment.be  This test is not yet approved or cleared by the [place] and has been authorized for detection and/or diagnosis of SARS-CoV-2 by FDA under an Emergency Use Authorization (EUA). This EUA will remain in effect (meaning this test can be used) for the duration of the COVID-19 declaration under Section 564(b)(1) of the Act, 21 U.S.C. section 360bbb-3(b)(1), unless the authorization is terminated or revoked.  Performed at [facility], [address] [place], [place] [address]       Radiology Studies: CT Abdomen Pelvis W Contrast  Result Date: 12/18/2020 CLINICAL DATA:  Diffuse back pain and flank swelling after  a fall. EXAM: CT ABDOMEN AND PELVIS WITH CONTRAST TECHNIQUE: Multidetector CT imaging of the abdomen and pelvis was performed using the standard protocol following bolus administration of intravenous contrast. CONTRAST:  21mL OMNIPAQUE IOHEXOL 300 MG/ML  SOLN COMPARISON:  None. FINDINGS: Lower chest: Motion artifact limits evaluation. Mild dependent changes in the lung bases. Cardiac enlargement. Coronary artery and aortic calcification. Hepatobiliary: Multiple circumscribed low-attenuation lesions throughout the liver are likely cysts. Largest measures about 1.8 cm in diameter. Surgical absence of the gallbladder. Mild bile duct dilatation is likely physiologic post cholecystectomy. Pancreas: Unremarkable. No pancreatic ductal dilatation or surrounding inflammatory changes. Spleen: Normal in size without focal abnormality. Adrenals/Urinary Tract: Adrenal glands are unremarkable. Kidneys are normal, without renal calculi, focal solid lesion, or hydronephrosis. Bladder is unremarkable. Bilateral renal cysts. Stomach/Bowel: Stomach is within normal limits. Appendix appears normal. No evidence of bowel wall thickening, distention, or inflammatory changes. Vascular/Lymphatic: Prominent aortic and iliac vascular calcifications. Right iliac artery is dilated, measuring 1.4 cm diameter. No significant lymphadenopathy in the abdomen. Reproductive: Uterus and ovaries are not enlarged. Other: No free air or free fluid in the abdomen. Abdominal wall musculature appears intact. Stents of area of increased density in the subcutaneous fat over the low back, extending from right to left, and measuring about 8.6 x 32.6 x 3.9 cm consistent with soft tissue hematoma. Areas of increased density within the hematoma may represent sentinel clot or focal areas of active extravasation. Musculoskeletal: Degenerative changes in the spine and hips. Anterior compression of T11 appears chronic. No acute displaced fractures are identified.  IMPRESSION: 1. Large soft tissue hematoma in the subcutaneous fat over the low back. Areas of increased density within the hematoma may represent sentinel clot or focal areas of active extravasation. 2. No evidence of bowel obstruction or inflammation. 3. Multiple hepatic and renal cysts. 4. Prominent aortic and iliac vascular calcifications. 5. Anterior compression of T11 appears chronic. No acute displaced fractures are identified. Aortic Atherosclerosis (ICD10-I70.0). Critical Value/emergent results were called by telephone at the time of interpretation on 12/18/2020 at 12:16 am to provider Dr. Wyvonnia Dusky, Who verbally acknowledged these results. Electronically Signed   By: Lucienne Capers M.D.   On: 12/18/2020 00:18      Scheduled Meds: . acetaminophen  650 mg Oral TID  . lidocaine  1 patch Transdermal Q24H   Continuous Infusions: . cefTRIAXone (ROCEPHIN)  IV       Assessment/Plan:  1.  Mechanical fall, paraspinal soft tissue hematoma: Hemoglobin relatively stable.  Will repeat this afternoon again to follow-up.  Patient agitated this morning and not cooperative to exam on the back.  Noted to be moving all extremities.  Ordered scheduled acetaminophen, Lidoderm patch and as needed pain medications to help.  Has tramadol as needed but unable to administer p.o. meds at this time per RN due to agitation.  2.  Delirium: Likely multifactorial  with underlying dementia, back pain, UTI, BDZ last night and hospitalization with unfamiliar surroundings.  Address pain, standard delirium precautions.  Treat UTI.  Avoid benzodiazepines.  Will give Haldol 1 dose and obtain EKG for QT interval (if patient cooperates).  Zyprexa as needed available.  May need sitter.  Resume home meds for underlying dementia/depression.  Will obtain CT head stat as I do not see it done at outside ED.  3.  UTI: Patient does have history of recurrent UTI.  UA showed pyuria and bacteria in the ED.  On IV ceftriaxone.  Urine culture  still pending at this time.  Previously grew Klebsiella.  4. Hypertension: Home meds show lisinopril.  Will hold given mild rise in creatinine and poor p.o. intake due to confusion.  Will give low-dose metoprolol if takes p.o.,  Will have PRN available.  Accelerated blood pressure could be due to pain and agitation.  5.  History of DVT: Xarelto held in concern for problem #1.  Poor prognosis.  Will discuss with family care goals.  6.  CKD stage IIIa with rising creatinine: Baseline creatinine appears to be 1.2-1.4.  Currently at 1.5 and BUN 55 indicating dehydration and a prerenal component.  Will place on IV fluids given poor oral intake and ongoing confusion.  7. Dementia/depression: Resume home meds-Seroquel, Depakote, melatonin and trazodone. Will consult palliative care team for care goals  DVT prophylaxis: Avoid anticoagulation due to problem #1.  We will hold off on SCDs due to history of DVT.  TED hose. Code Status: Will address with family Family / Patient Communication: Will contact family and update shortly Disposition Plan: Back to SNF when medically cleared Status is: Inpatient  Remains inpatient appropriate because:Altered mental status   Dispo: The patient is from: SNF              Anticipated d/c is to: SNF              Patient currently is not medically stable to d/c.   Difficult to place patient No  Time spent: 45 minutes     >50% time spent in discussions with care team and coordination of care.  Addendum 6 PM: Discussed with daughter, [name] and granddaughter Colleen Can [phone]) who stated patient gets very confused when she has UTI.  They had visited patient earlier today and are aware that patient is on four-point restraints.  Patient has been swinging at staff, spitting at them and refusing to take p.o. meds or diet.  She has pulled on her IV lines and nurse not sure if morning dose of Haldol fully went into her system as she only improved slightly.   Discussed with family that without IV access, unable to give any medications for agitation, infection, unable to provide IV fluids and unable to even obtain CT head or an EKG for checking QT interval.  Patient's CODE STATUS is not clarified in record.  Discussed with both daughter and granddaughter regarding implications of using antipsychotics for agitation and arrhythmia risk in this elderly patient as well as risk of worsening confusion with IV benzodiazepines.  At this point, family is comfortable with CODE STATUS being DNR (no CPR, shocking or intubation or resuscitative measures including medications).  They would like Ms. Jakeway to be comfortable and undergo the most necessary interventions as long as they are not very invasive or heroic.  Accordingly DNR orders have been placed.  Granddaughter stated that patient usually responds well with a combination of Haldol/low-dose Ativan at  the nursing facility when she has agitation.  Will order Haldol 4 mg IM and 1 mg Ativan as discussed and agreed upon by daughter and attempt to obtain IV access and resume IV antibiotics/IV fluids.  Hopefully she can tolerate p.o. diet/medications.  IV antihypertensives and AV blockers as needed ordered.  Discussed with bedside nurse in detail to obtain CT head, EKG if patient cooperates after these medications.  Granddaughter pretty sure that nursing facility staff reported to her that patient did not hit her head--hence she is okay with holding off on CT head if patient remains confused/noncooperative after the above interventions.  Palliative care team consult placed and family aware that they might contact them over the weekend for additional care goal discussions.  Additional 30 minutes spent in care coordination and discussions with family regarding CODE STATUS/care goals.  Guilford Shi, MD [facility] Pager in [place]  If 7PM-7AM, please contact night-coverage [email or web address] 12/18/2020, 4:40 PM

## 2020-12-18 NOTE — Plan of Care (Signed)
  Problem: Safety: Goal: Non-violent Restraint(s) 12/18/2020 2049 by Brooke Bonito, RN Outcome: Not Progressing 12/18/2020 2049 by Brooke Bonito, RN Outcome: Progressing   Problem: Education: Goal: Knowledge of General Education information will improve Description: Including pain rating scale, medication(s)/side effects and non-pharmacologic comfort measures 12/18/2020 2049 by Brooke Bonito, RN Outcome: Not Progressing 12/18/2020 2049 by Brooke Bonito, RN Outcome: Progressing   Problem: Health Behavior/Discharge Planning: Goal: Ability to manage health-related needs will improve 12/18/2020 2049 by Brooke Bonito, RN Outcome: Not Progressing 12/18/2020 2049 by Brooke Bonito, RN Outcome: Progressing   Problem: Clinical Measurements: Goal: Ability to maintain clinical measurements within normal limits will improve 12/18/2020 2049 by Brooke Bonito, RN Outcome: Not Progressing 12/18/2020 2049 by Brooke Bonito, RN Outcome: Progressing Goal: Will remain free from infection 12/18/2020 2049 by Brooke Bonito, RN Outcome: Not Progressing 12/18/2020 2049 by Brooke Bonito, RN Outcome: Progressing Goal: Diagnostic test results will improve 12/18/2020 2049 by Brooke Bonito, RN Outcome: Not Progressing 12/18/2020 2049 by Brooke Bonito, RN Outcome: Progressing Goal: Respiratory complications will improve 12/18/2020 2049 by Brooke Bonito, RN Outcome: Not Progressing 12/18/2020 2049 by Brooke Bonito, RN Outcome: Progressing Goal: Cardiovascular complication will be avoided 12/18/2020 2049 by Brooke Bonito, RN Outcome: Not Progressing 12/18/2020 2049 by Brooke Bonito, RN Outcome: Progressing   Problem: Activity: Goal: Risk for activity intolerance will decrease 12/18/2020 2049 by Brooke Bonito, RN Outcome: Not Progressing 12/18/2020 2049 by Brooke Bonito, RN Outcome: Progressing   Problem: Nutrition: Goal: Adequate nutrition will be maintained 12/18/2020 2049 by Brooke Bonito, RN Outcome: Not  Progressing 12/18/2020 2049 by Brooke Bonito, RN Outcome: Progressing   Problem: Coping: Goal: Level of anxiety will decrease 12/18/2020 2049 by Brooke Bonito, RN Outcome: Not Progressing 12/18/2020 2049 by Brooke Bonito, RN Outcome: Progressing   Problem: Elimination: Goal: Will not experience complications related to bowel motility 12/18/2020 2049 by Brooke Bonito, RN Outcome: Not Progressing 12/18/2020 2049 by Brooke Bonito, RN Outcome: Progressing Goal: Will not experience complications related to urinary retention 12/18/2020 2049 by Brooke Bonito, RN Outcome: Not Progressing 12/18/2020 2049 by Brooke Bonito, RN Outcome: Progressing   Problem: Pain Managment: Goal: General experience of comfort will improve 12/18/2020 2049 by Brooke Bonito, RN Outcome: Not Progressing 12/18/2020 2049 by Brooke Bonito, RN Outcome: Progressing   Problem: Safety: Goal: Ability to remain free from injury will improve 12/18/2020 2049 by Brooke Bonito, RN Outcome: Not Progressing 12/18/2020 2049 by Brooke Bonito, RN Outcome: Progressing   Problem: Skin Integrity: Goal: Risk for impaired skin integrity will decrease 12/18/2020 2049 by Brooke Bonito, RN Outcome: Not Progressing 12/18/2020 2049 by Brooke Bonito, RN Outcome: Progressing

## 2020-12-18 NOTE — Plan of Care (Signed)

## 2020-12-18 NOTE — ED Provider Notes (Addendum)
Care assumed from Dr. Almyra Free.  Patient here with low back pain and hematoma after a fall.  She is does take Eliquis.  Denies head injury.  She is a paraspinal lumbar hematoma.  Call received from radiology Dr. Gerilyn Nestle.  There is a large paraspinal hematoma with small areas of active extravasation  D/w Dr Dwaine Gale of interventional radiology.  He states there is no role for any IR intervention as this is subcutaneous and is most likely venous bleeds.  Does agree with anticoagulation reversal  Vitals remained stable.  Patient will need admission.  Discussed with her daughter and power of attorney [name]. She does take Xarelto for history of DVT. Contrary to previous documentation she does not have atrial fibrillation or take eliquis.  D/w Marya Amsler PharmD.  He is confirmed with patient's living facility [facility] that she is currently on Xarelto with last dose on April 22.  Given size of hematoma near spinal cord will initiate Kcentra for emergent reversal.  Family in agreement.  Admission discussed with Dr. Josephine Cables.  He will arrange admission to [facility] so patient can be near specialists including IR, vascular surgery, orthopedic surgery if intervention is necessary. Family updated.  .Critical Care Performed by: Ezequiel Essex, MD Authorized by: Ezequiel Essex, MD   Critical care provider statement:    Critical care time (minutes):  60   Critical care was necessary to treat or prevent imminent or life-threatening deterioration of the following conditions:  Trauma (spinal hematoma on xarelto)   Critical care was time spent personally by me on the following activities:  Discussions with consultants, evaluation of patient's response to treatment, examination of patient, ordering and performing treatments and interventions, ordering and review of laboratory studies, ordering and review of radiographic studies, pulse oximetry, re-evaluation of patient's condition, obtaining history  from patient or surrogate and review of old charts      Bernice Mullin, Annie Main, MD 12/18/20 1914    Ezequiel Essex, MD 12/18/20 [phone]

## 2020-12-18 NOTE — ED Notes (Signed)
After returning back to room, pt was stating that she was leaving and was trying to get up. This RN and NT tried to place pt back in bed. Pt began pinching, scratching, slapping, and biting staff. Additional staff attempted to calm pt down and get her back in the bed, but was unsuccessful. Security was called for assistance. Pt was placed in bed, mittens and bilateral wrist and ankle restraints applied, and IV medication given per order. Pt has small skin tear on left forearm.

## 2020-12-19 ENCOUNTER — Encounter (HOSPITAL_COMMUNITY): Payer: Medicare Other

## 2020-12-19 DIAGNOSIS — T148XXA Other injury of unspecified body region, initial encounter: Secondary | ICD-10-CM

## 2020-12-19 DIAGNOSIS — F0390 Unspecified dementia without behavioral disturbance: Secondary | ICD-10-CM

## 2020-12-19 DIAGNOSIS — D62 Acute posthemorrhagic anemia: Secondary | ICD-10-CM

## 2020-12-19 DIAGNOSIS — Z515 Encounter for palliative care: Secondary | ICD-10-CM

## 2020-12-19 LAB — BASIC METABOLIC PANEL
Anion gap: 8 (ref 5–15)
BUN: 35 mg/dL — ABNORMAL HIGH (ref 8–23)
CO2: 25 mmol/L (ref 22–32)
Calcium: 9.2 mg/dL (ref 8.9–10.3)
Chloride: 106 mmol/L (ref 98–111)
Creatinine, Ser: 1.25 mg/dL — ABNORMAL HIGH (ref 0.44–1.00)
GFR, Estimated: 41 mL/min — ABNORMAL LOW (ref >60)
Glucose, Bld: 133 mg/dL — ABNORMAL HIGH (ref 70–99)
Potassium: 4 mmol/L (ref 3.5–5.1)
Sodium: 139 mmol/L (ref 135–145)

## 2020-12-19 LAB — CBC
HCT: 25.8 % — ABNORMAL LOW (ref 36.0–46.0)
Hemoglobin: 8.1 g/dL — ABNORMAL LOW (ref 12.0–15.0)
MCH: 29.5 pg (ref 26.0–34.0)
MCHC: 31.4 g/dL (ref 30.0–36.0)
MCV: 93.8 fL (ref 80.0–100.0)
Platelets: 141 10*3/uL — ABNORMAL LOW (ref 150–400)
RBC: 2.75 MIL/uL — ABNORMAL LOW (ref 3.87–5.11)
RDW: 13.3 % (ref 11.5–15.5)
WBC: 10.2 10*3/uL (ref 4.0–10.5)
nRBC: 0 % (ref 0.0–0.2)

## 2020-12-19 MED ORDER — LORAZEPAM 0.5 MG PO TABS
0.2500 mg | ORAL_TABLET | Freq: Two times a day (BID) | ORAL | Status: DC | PRN
  Administered 2020-12-20 – 2020-12-22 (×4): 0.25 mg via ORAL
  Filled 2020-12-19 (×5): qty 1

## 2020-12-19 MED ORDER — SERTRALINE HCL 20 MG/ML PO CONC
60.0000 mg | Freq: Every morning | ORAL | Status: DC
  Administered 2020-12-20 – 2020-12-22 (×3): 60 mg via ORAL
  Filled 2020-12-19 (×4): qty 3

## 2020-12-19 MED ORDER — FERROUS SULFATE 325 (65 FE) MG PO TABS
325.0000 mg | ORAL_TABLET | Freq: Every day | ORAL | Status: DC
  Administered 2020-12-19 – 2020-12-22 (×4): 325 mg via ORAL
  Filled 2020-12-19 (×4): qty 1

## 2020-12-19 MED ORDER — ACETAMINOPHEN 500 MG PO TABS
1000.0000 mg | ORAL_TABLET | Freq: Three times a day (TID) | ORAL | Status: DC
  Administered 2020-12-19 – 2020-12-21 (×6): 1000 mg via ORAL
  Filled 2020-12-19 (×6): qty 2

## 2020-12-19 MED ORDER — SODIUM CHLORIDE 0.9 % IV BOLUS: 500.0000 mL | Freq: Once | INTRAVENOUS | Status: DC

## 2020-12-19 MED ORDER — MORPHINE SULFATE (PF) 2 MG/ML IV SOLN
1.0000 mg | INTRAVENOUS | Status: DC | PRN
  Administered 2020-12-20: 1 mg via INTRAVENOUS
  Filled 2020-12-19: qty 1

## 2020-12-19 MED ORDER — TRAMADOL HCL 50 MG PO TABS
50.0000 mg | ORAL_TABLET | Freq: Four times a day (QID) | ORAL | Status: DC | PRN
  Administered 2020-12-19: 50 mg via ORAL
  Filled 2020-12-19: qty 1

## 2020-12-19 MED ORDER — BOOST / RESOURCE BREEZE PO LIQD CUSTOM
1.0000 | Freq: Three times a day (TID) | ORAL | Status: DC
  Administered 2020-12-19 – 2020-12-22 (×5): 1 via ORAL

## 2020-12-19 MED ORDER — OXYCODONE HCL 5 MG PO TABS
2.5000 mg | ORAL_TABLET | Freq: Three times a day (TID) | ORAL | Status: DC
  Administered 2020-12-19 – 2020-12-21 (×5): 2.5 mg via ORAL
  Filled 2020-12-19 (×5): qty 1

## 2020-12-19 MED ORDER — OXYCODONE HCL 5 MG PO TABS
2.5000 mg | ORAL_TABLET | Freq: Four times a day (QID) | ORAL | Status: DC
Start: 2020-12-19 — End: 2020-12-19

## 2020-12-19 NOTE — Progress Notes (Signed)
Pt c/o inabiliy to urinate. Pt encouraged to try but she said she couldn't.Bladderscan=301. MD notified. Order for I/O cath obtained. When I/o cath attempted, pt was able to void on her own.

## 2020-12-19 NOTE — Progress Notes (Addendum)
PROGRESS NOTE    Kelly Freeman  EHU:[id number]  DOB: 10-03-1926  PCP: Sharion Balloon, FNP Admit date:12/17/2020 Chief compliant: Back pain, fall at SNF 87 y.o. female with medical history significant for dementia, hypertension, recurrent UTI, CKD stage III, depression and history of DVT on Xarelto who presents to the [facility] ED via EMS from SNF due to back pain after a mechanical fall (please see H&P for details). Per H&P, granddaughter reported that patient fell on buttocks while trying to position her into a chair, patient ambulates with a walker at baseline.  No reported head injury. ED Course: Afebrile,normocytic anemia, BUN to creatinine 54/1.65 (baseline creatinine at 1.2-1.5).CT abdomen and pelvis with contrast showed large soft tissue hematoma ( 8.6 x 32.6 x 3.9 cm) in the subcutaneous fat over the low back with areas of increased density within the hematoma which may represent sentinel clot or focal areas of active extravasation.Tylenol was given, patient was given IV ceftriaxone due to presumed UTI.Dr. Dwaine Gale of interventional radiology was consulted on phone and there was no role for any IR intervention at this time since hematoma is subcutaneous and most likely venous bleeds. Anticoagulation reversal (due to patient being on Xarelto) with Eppie Gibson was planned.  Hospitalist was asked to admit patient for further evaluation and management. Hospital course: Patient received Kcentra in the ED and transferred to [facility] campus in case of worsening and needing IR/vascular surgery interventions. Patient receiving IV ceftriaxone for presumed UTI.Patient very confused 4/23 morning and agitated trying to get out of the bed-required 4 point restrains and haldol dosing with minimal improvement. After d/w  family regarding care goals, made DNR--> palliative care consulted.  Patient did receive additional 4 mg Haldol IM yesterday evening along with 1 mg Ativan with which her agitation/confusion improved and was able  to undergo CT head-no evidence of bleed.   Subjective:.  Patient complaining of pain in her back.  Per bedside nurse, she also received 1 dose of tramadol for back pain and was able to rest well overnight.  She is oriented to person (able to name daughter/granddaughter) but disoriented to place and time.  Overall her confusion is much improved since yesterday and she is now off restraints.   Objective: Vitals:   12/19/20 0049 12/19/20 0343 12/19/20 0711 12/19/20 0744  BP: (!) 143/112 (!) 136/49 135/65 (!) 140/96  Pulse: 86 67 71 80  Resp: 20 16 20  (!) 21  Temp:  98.7 F (37.1 C)  97.7 F (36.5 C)  TempSrc:  Oral  Axillary  SpO2: 98% 98% 99% 100%  Weight:      Height:        Intake/Output Summary (Last 24 hours) at 12/19/2020 1000 Last data filed at 12/19/2020 0600 Gross per 24 hour  Intake 1143.71 ml  Output 400 ml  Net 743.71 ml   Filed Weights   12/17/20 2043 12/18/20 0500  Weight: 73 kg 66.7 kg    Physical Examination:  General: Appears less confused but still moaning in pain.  Off restraints. Head ENT: Atraumatic normocephalic, PERRLA, neck supple Heart: S1-S2 heard, regular rate and rhythm, no murmurs.  No leg edema noted Lungs: Equal air entry bilaterally, no rhonchi or rales on exam, no accessory muscle use Abdomen: Bowel sounds heard, soft, nontender, nondistended. No organomegaly.  No CVA tenderness Extremities: No pedal edema.  No cyanosis or clubbing. Neurological: Moving all extremities, awake alert oriented x1, no focal weakness  Skin: Stable hematoma/soft tissue swelling on her lower back  Data Reviewed: I have personally reviewed following labs and imaging studies  CBC: Recent Labs  Lab 12/17/20 2200 12/18/20 0129 12/18/20 0858 12/18/20 1727  WBC 8.7  --  11.3*  --   NEUTROABS 6.0  --   --   --   HGB 11.1* 9.6* 9.3* 8.5*  HCT 35.4* 30.4* 29.5* 26.2*  MCV 94.7  --  93.7  --   PLT 161  --  158  --    Basic Metabolic Panel: Recent  Labs  Lab 12/17/20 2200 12/18/20 0858  NA 136 137  K 4.2 4.4  CL 101 104  CO2 28 24  GLUCOSE 111* 112*  BUN 54* 45*  CREATININE 1.65* 1.52*  CALCIUM 8.9 8.8*   GFR: Estimated Creatinine Clearance: 23.4 mL/min (A) (by C-G formula based on SCr of 1.52 mg/dL (H)). Liver Function Tests: No results for input(s): AST, ALT, ALKPHOS, BILITOT, PROT, ALBUMIN in the last 168 hours. No results for input(s): LIPASE, AMYLASE in the last 168 hours. No results for input(s): AMMONIA in the last 168 hours. Coagulation Profile: Recent Labs  Lab 12/18/20 0129  INR 1.2   Cardiac Enzymes: No results for input(s): CKTOTAL, CKMB, CKMBINDEX, TROPONINI in the last 168 hours. BNP (last 3 results) No results for input(s): PROBNP in the last 8760 hours. HbA1C: No results for input(s): HGBA1C in the last 72 hours. CBG: Recent Labs  Lab 12/17/20 2114 12/18/20 1626  GLUCAP 116* 131*   Lipid Profile: No results for input(s): CHOL, HDL, LDLCALC, TRIG, CHOLHDL, LDLDIRECT in the last 72 hours. Thyroid Function Tests: No results for input(s): TSH, T4TOTAL, FREET4, T3FREE, THYROIDAB in the last 72 hours. Anemia Panel: No results for input(s): VITAMINB12, FOLATE, FERRITIN, TIBC, IRON, RETICCTPCT in the last 72 hours. Sepsis Labs: No results for input(s): PROCALCITON, LATICACIDVEN in the last 168 hours.  Recent Results (from the past 240 hour(s))  Resp Panel by RT-PCR (Flu A&B, Covid) Nasopharyngeal Swab     Status: None   Collection Time: 12/18/20  1:05 AM   Specimen: Nasopharyngeal Swab; Nasopharyngeal(NP) swabs in vial transport medium  Result Value Ref Range Status   SARS Coronavirus 2 by RT PCR NEGATIVE NEGATIVE Final    Comment: (NOTE) SARS-CoV-2 target nucleic acids are NOT DETECTED.  The SARS-CoV-2 RNA is generally detectable in upper respiratory specimens during the acute phase of infection. The lowest concentration of SARS-CoV-2 viral copies this assay can detect is 138 copies/mL. A  negative result does not preclude SARS-Cov-2 infection and should not be used as the sole basis for treatment or other patient management decisions. A negative result may occur with  improper specimen collection/handling, submission of specimen other than nasopharyngeal swab, presence of viral mutation(s) within the areas targeted by this assay, and inadequate number of viral copies(<138 copies/mL). A negative result must be combined with clinical observations, patient history, and epidemiological information. The expected result is Negative.  Fact Sheet for Patients:  EntrepreneurPulse.com.au  Fact Sheet for Healthcare Providers:  IncredibleEmployment.be  This test is no t yet approved or cleared by the [place] FDA and  has been authorized for detection and/or diagnosis of SARS-CoV-2 by FDA under an Emergency Use Authorization (EUA). This EUA will remain  in effect (meaning this test can be used) for the duration of the COVID-19 declaration under Section 564(b)(1) of the Act, 21 U.S.C.section 360bbb-3(b)(1), unless the authorization is terminated  or revoked sooner.       Influenza A by PCR NEGATIVE NEGATIVE Final   Influenza  B by PCR NEGATIVE NEGATIVE Final    Comment: (NOTE) The Xpert Xpress SARS-CoV-2/FLU/RSV plus assay is intended as an aid in the diagnosis of influenza from Nasopharyngeal swab specimens and should not be used as a sole basis for treatment. Nasal washings and aspirates are unacceptable for Xpert Xpress SARS-CoV-2/FLU/RSV testing.  Fact Sheet for Patients: EntrepreneurPulse.com.au  Fact Sheet for Healthcare Providers: IncredibleEmployment.be  This test is not yet approved or cleared by the [place] FDA and has been authorized for detection and/or diagnosis of SARS-CoV-2 by FDA under an Emergency Use Authorization (EUA). This EUA will remain in effect (meaning this test can  be used) for the duration of the COVID-19 declaration under Section 564(b)(1) of the Act, 21 U.S.C. section 360bbb-3(b)(1), unless the authorization is terminated or revoked.  Performed at [facility], [address], [place], [place] [address]       Radiology Studies: CT HEAD WO CONTRAST  Result Date: 12/18/2020 CLINICAL DATA:  Delirium.  Fall. EXAM: CT HEAD WITHOUT CONTRAST TECHNIQUE: Contiguous axial images were obtained from the base of the skull through the vertex without intravenous contrast. COMPARISON:  05/13/2020 FINDINGS: Brain: There is no evidence of an acute infarct, intracranial hemorrhage, mass, midline shift, or extra-axial fluid collection. Patchy and confluent hypodensities in the cerebral white matter bilaterally are stable to mildly progressive compared to the prior CT and are nonspecific but compatible with extensive chronic small vessel ischemic disease. There is mild-to-moderate cerebral atrophy. Vascular: Calcified atherosclerosis at the skull base. Skull: No acute fracture or suspicious osseous lesion. Sinuses/Orbits: Mild bilateral ethmoid air cell mucosal thickening. Clear mastoid air cells. Bilateral cataract extraction. Other: None. IMPRESSION: 1. No evidence of acute intracranial abnormality. 2. Extensive chronic small vessel ischemic disease. Electronically Signed   By: Logan Bores M.D.   On: 12/18/2020 19:15   CT Abdomen Pelvis W Contrast  Result Date: 12/18/2020 CLINICAL DATA:  Diffuse back pain and flank swelling after a fall. EXAM: CT ABDOMEN AND PELVIS WITH CONTRAST TECHNIQUE: Multidetector CT imaging of the abdomen and pelvis was performed using the standard protocol following bolus administration of intravenous contrast. CONTRAST:  32mL OMNIPAQUE IOHEXOL 300 MG/ML  SOLN COMPARISON:  None. FINDINGS: Lower chest: Motion artifact limits evaluation. Mild dependent changes in the lung bases. Cardiac enlargement. Coronary artery and aortic calcification.  Hepatobiliary: Multiple circumscribed low-attenuation lesions throughout the liver are likely cysts. Largest measures about 1.8 cm in diameter. Surgical absence of the gallbladder. Mild bile duct dilatation is likely physiologic post cholecystectomy. Pancreas: Unremarkable. No pancreatic ductal dilatation or surrounding inflammatory changes. Spleen: Normal in size without focal abnormality. Adrenals/Urinary Tract: Adrenal glands are unremarkable. Kidneys are normal, without renal calculi, focal solid lesion, or hydronephrosis. Bladder is unremarkable. Bilateral renal cysts. Stomach/Bowel: Stomach is within normal limits. Appendix appears normal. No evidence of bowel wall thickening, distention, or inflammatory changes. Vascular/Lymphatic: Prominent aortic and iliac vascular calcifications. Right iliac artery is dilated, measuring 1.4 cm diameter. No significant lymphadenopathy in the abdomen. Reproductive: Uterus and ovaries are not enlarged. Other: No free air or free fluid in the abdomen. Abdominal wall musculature appears intact. Stents of area of increased density in the subcutaneous fat over the low back, extending from right to left, and measuring about 8.6 x 32.6 x 3.9 cm consistent with soft tissue hematoma. Areas of increased density within the hematoma may represent sentinel clot or focal areas of active extravasation. Musculoskeletal: Degenerative changes in the spine and hips. Anterior compression of T11 appears chronic. No acute displaced fractures  are identified. IMPRESSION: 1. Large soft tissue hematoma in the subcutaneous fat over the low back. Areas of increased density within the hematoma may represent sentinel clot or focal areas of active extravasation. 2. No evidence of bowel obstruction or inflammation. 3. Multiple hepatic and renal cysts. 4. Prominent aortic and iliac vascular calcifications. 5. Anterior compression of T11 appears chronic. No acute displaced fractures are identified. Aortic  Atherosclerosis (ICD10-I70.0). Critical Value/emergent results were called by telephone at the time of interpretation on 12/18/2020 at 12:16 am to provider Dr. Wyvonnia Dusky, Who verbally acknowledged these results. Electronically Signed   By: Lucienne Capers M.D.   On: 12/18/2020 00:18      Scheduled Meds: . acetaminophen  650 mg Oral TID  . divalproex  125 mg Oral Q12H  . lidocaine  1 patch Transdermal Q24H  . melatonin  5 mg Oral QHS  . traZODone  50 mg Oral QHS   Continuous Infusions: . cefTRIAXone (ROCEPHIN)  IV Stopped (12/18/20 2155)  . dextrose 5 % and 0.9% NaCl 75 mL/hr at 12/19/20 0500     Assessment/Plan:  1.  Mechanical fall, soft tissue hematoma on low back: CT abd/pelvis at [facility] showed large hematoma in subcutaneous fat as above.  Hemoglobin somewhat downtrending from baseline 9-10-8.5 yesterday evening..  Will repeat today again to follow-up.  Ordered scheduled acetaminophen, Lidoderm patch and as needed pain medications to help.  Will increase tramadol frequency seem to be helping and patient experiencing pain again this morning.  Discussed with bedside nurse that we will have IV morphine available for refractory pain but judicious use advised  2.  Delirium: Likely multifactorial with underlying dementia, back pain, UTI, BDZ last night and hospitalization with unfamiliar surroundings.  CT head negative.  Address pain, standard delirium precautions.  Treating UTI.  Avoid benzodiazepines. EKG ordered for QT interval yesterday but still pending-ordered again.  Zyprexa as needed available. Resume home meds for underlying dementia/depression.    3.  UTI: Patient does have history of recurrent UTI.  UA showed pyuria and bacteria in the ED.  On IV ceftriaxone.  Urine culture still pending at this time.  Previously grew Klebsiella.  4.  Acute blood loss anemia on chronic anemia: Continue to monitor hemoglobin, there might be a dilutional component as well.  Family consented for PRBC  transfusion if hemoglobin drops less than 7.  5.  History of DVT: According to granddaughter patient had a fall around 6 months back and was hospitalized at which time she was diagnosed with a blood clot in her leg.  Xarelto now held in concern for problem #1.  Discussed with family regarding care goals and risk of worsening bleeding with resumption of anticoagulation versus recurrent DVT without.  Will obtain Doppler lower extremities to rule out residual clot or new clot-if positive can consider IVC filter.  If negative, can add SCDs for DVT prophylaxis.  TED hose for now.  6.  CKD stage IIIa with rising creatinine: Baseline creatinine appears to be 1.2-1.4.  Currently at 1.5 and BUN 55 indicating dehydration and a prerenal component.  Continue IV fluids given poor oral intake and ongoing confusion.  7. Hypertension: Home meds show lisinopril.  Will hold given mild rise in creatinine and poor p.o. intake due to confusion. Low-dose metoprolol ordered and appears to be helping.  PRN meds available.  Accelerated blood pressure could be due to pain and agitation.  8. Dementia/depression: Resume home meds-Seroquel, Depakote, melatonin and trazodone.  Consulted palliative care team for care  goals  DVT prophylaxis: Avoid anticoagulation due to problem #1.  We will hold off on SCDs due to history of DVT as discussed above.  TED hose. Code Status: DNR Family / Patient Communication: Updated daughter [name] and granddaughter Colleen Can [phone]) regarding her progress and plans for Doppler lower extremity.  disposition Plan: Back to SNF when medically cleared Status is: Inpatient  Remains inpatient appropriate because:Altered mental status   Dispo: The patient is from: SNF              Anticipated d/c is to: SNF              Patient currently is not medically stable to d/c.   Difficult to place patient No  Time spent: 35 minutes     >50% time spent in discussions with care team and  coordination of care.  Addendum 2 PM: Received message from palliative care team requesting management suggestions/further imaging given patient's complaint of pain pulling into her right leg.  Discussed with trauma on-call, Dr. Dema Severin, who reviewed CT images and felt hematoma only in subcutaneous fat and recommended continued conservative management as expected to improve spontaneously 48 hours after stopping anticoagulation and no further imaging recommended. Agreed with trying Neurontin for pain.  Continue to monitor hemoglobin/HCT as still downtrending-could be partly related to IV fluids/dilutional effect.  Creatinine improving.  Guilford Shi, MD [facility] Pager in [place]  If 7PM-7AM, please contact night-coverage [email or web address] 12/19/2020, 10:00 AM

## 2020-12-19 NOTE — Progress Notes (Signed)
Pt confused but no longer agitated or pulling at devices, tubes or lines. Pt able to follow commands. Restraints discontinued at this time due to patient no longer meeting the criteria.

## 2020-12-19 NOTE — Consult Note (Addendum)
Consultation Note Date: 12/19/2020   Patient Name: Kelly Freeman  DOB: [birth year]  MRN: [id number]  Age / Sex: 87 y.o., female  PCP: Sharion Balloon, FNP Referring Physician: Guilford Shi, MD  Reason for Consultation: Establishing goals of care  HPI/Patient Profile: 87 y.o. female  with past medical history of dementia, CKD 2, MGUS, DM, depression, peripheral neuropathy, DDD, recurrent UTI, DVT no longer on anticoagulation due to falls, who was admitted on 12/17/2020 with after a fall at ALF[facility] Care.   She has a very large hematoma over her low back 8x32 cm as well as multiple other bruises.    Clinical Assessment and Goals of Care:  I have reviewed medical records including EPIC notes, labs and imaging, received report from the care team, examined the patient and spoke on the phone with her daughter [name] to discuss diagnosis prognosis, [place], EOL wishes, disposition and options.  The patient wakes after I talk with her and touch her shoulder.  She tells me she is in pain.  I asked if it was her lower back and she told me "no, it's here" and touched her left leg just above her lateral knee.   Per nursing patient has taken two sips of tea and not eaten solid food.  I introduced Palliative Medicine as specialized medical care for people living with serious illness. It focuses on providing relief from the symptoms and stress of a serious illness. We discussed a brief life review of the patient.  [name] has had a talent for playing the piano since the age of 89.  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  If she can hear it she can play it".  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  As far as functional and nutritional status - she has lived at memory care for the past 2 years and done well there.  She was able to get around on her walker and often played the piano.  She was able to self toilet.  [name] (dtr) and the patient's grand daughter are very active in her care and see her daily.  Her grand daughter sits with her daily.  [name] explained that she is her mother's legal guardian.  The patient has two daughters.  Her other daughter lives in [place] and unfortunately has taken advantage of [place].  [name] describes her sister using their mother to obtain fentanyl pain medication.    We discussed her current illness and what it means in the larger context of her on-going co-morbidities.   I attempted to elicit values and goals of care important to the patient.  I asked [name] what her hopes for her mother are.  [name] replied that she wanted her mother to live out the rest of her  life without crying in pain.  The difference between aggressive medical intervention and comfort care was considered in light of the patient's goals of care.  Advanced directives, concepts specific to code status, artifical feeding and hydration, and rehospitalization were considered and discussed.  [name] explained that her mother is a DNR.  She does not want her to receive any invasive life prolonging measures.  [name] states that a unit of blood may be ok if it helps her feel better and fixes a problem but she doesn't want her to have invasive procedures or surgery.   Questions and concerns were addressed.  The family was encouraged to call with questions or concerns.    Primary Decision Maker:  LEGAL GUARDIAN daughter [name]    SUMMARY OF RECOMMENDATIONS    PMT will continue to follow with you.  Hopefully patient will start eating and drinking. Patient may be a good candidate for Hospice if she is not eating or drinking well on discharge.  Code Status/Advance Care  Planning:  DNR   Symptom Management:   Scheduled 1000 mg tylenol q 8 hours for pain  Scheduled 2.5 mg Oxy IR q 8 hours for pain.  I'm worried pain was contributing to her delirium.  Scheduled senna qhs  She is on depakote scheduled and olanzapine PRN for delirium  I feel patient will do better back in her familiar environment at ALF memory care as soon as she can be safely discharged.   Additional Recommendations (Limitations, Scope, Preferences):  Full Scope Treatment but with minimally invasive measures only.  Delirium precautions.  Palliative Prophylaxis:   Frequent Pain Assessment  Psycho-social/Spiritual:   Desire for further Chaplaincy support: not discussed.  Prognosis: concerning for less than 6 months given frequent falls and minimal PO intake.    Discharge Planning: To Be Determined      Primary Diagnoses: Present on Admission: . Hematoma, nontraumatic, soft tissue . History of recurrent UTIs . Hypertension   I have reviewed the medical record, interviewed the patient and family, and examined the patient. The following aspects are pertinent.  Past Medical History:  Diagnosis Date  . Acute cystitis without hematuria   . Acute diarrhea   . Chronic allergic rhinitis   . CKD (chronic kidney disease) stage 3, GFR 30-59 ml/min (HCC)   . Cognitive impairment   . DDD (degenerative disc disease), lumbar   . Dizziness   . E. coli UTI   . Fever   . Gastroenteritis   . Hereditary and idiopathic peripheral neuropathy   . History of recurrent UTIs   . Hypertension   . Hypertension   . MGUS (monoclonal gammopathy of unknown significance)   . Migraine without aura and without status migrainosus, not intractable   . Mixed incontinence urge and stress   . Moderate episode of recurrent major depressive disorder ([place])   . Nausea and vomiting   . Obesity   . Obstructive sleep apnea   . Osteopenia   . Recurrent falls   . Restless leg syndrome   . Type 2  diabetes mellitus with kidney complication, with long-term current use of insulin ([place])   . UTI (urinary tract infection)    Social History   Socioeconomic History  . Marital status: Widowed    Spouse name: Not on file  . Number of children: Not on file  . Years of education: Not on file  . Highest education level: Not on file  Occupational History  . Not on file  Tobacco Use  .  Smoking status: Never Smoker  . Smokeless tobacco: Never Used  Vaping Use  . Vaping Use: Never used  Substance and Sexual Activity  . Alcohol use: Never  . Drug use: Never  . Sexual activity: Not Currently  Other Topics Concern  . Not on file  Social History Narrative   Diet:       Do you drink/ eat things with caffeine?  Yes      Marital status:   Widowed                            What year were you married ? 1948      Do you live in a house, apartment,assistred living, condo, trailer, etc.)? Trailer      Is it one or more stories? One      How many persons live in your home ? 2      Do you have any pets in your home ?(please list) NO      Current or past profession: Office Work      Do you exercise?   Not Much                           Type & how often:       Do you have a living will?       Do you have a DNR form?                       If not, do you want to discuss one?       Do you have signed POA?HPOA forms?                 If so, please bring to your        appointment      Social Determinants of Health   Financial Resource Strain: Not on file  Food Insecurity: Not on file  Transportation Needs: Not on file  Physical Activity: Not on file  Stress: Not on file  Social Connections: Not on file   Family History  Problem Relation Age of Onset  . Thrombosis Father 69       Coronary  . Stomach cancer Mother 54  . Arthritis Son 30  . Arthritis Daughter 65  . Cirrhosis Daughter 43       Stage 3   . Hepatitis C Daughter     Allergies  Allergen Reactions  . Levofloxacin  Rash  . Gabapentin Other (See Comments)    Other reaction(s): Confusion     Vital Signs: BP (!) 140/96 (BP Location: Left Arm)   Pulse 74   Temp 98.4 F (36.9 C) (Axillary)   Resp 20   Ht 5\' 7"  (1.702 m)   Wt 66.7 kg   SpO2 99%   BMI 23.03 kg/m  Pain Scale: PAINAD   Pain Score: 0-No pain   SpO2: SpO2: 99 % O2 Device:SpO2: 99 % O2 Flow Rate: .     Palliative Assessment/Data: 20%     Time In: 1:45 Time Out: 2:50 Time Total: 65 min. Visit consisted of counseling and education dealing with the complex and emotionally intense issues surrounding the need for palliative care and symptom management in the setting of serious and potentially life-threatening illness. Greater than 50%  of this time was spent counseling and coordinating care related to the above assessment and plan.  Signed  by: Florentina Jenny, PA-C Palliative Medicine  Please contact Palliative Medicine Team phone at [phone] for questions and concerns.  For individual provider: See Shea Evans

## 2020-12-20 ENCOUNTER — Inpatient Hospital Stay (HOSPITAL_COMMUNITY): Payer: Medicare Other

## 2020-12-20 DIAGNOSIS — Z86718 Personal history of other venous thrombosis and embolism: Secondary | ICD-10-CM

## 2020-12-20 LAB — BASIC METABOLIC PANEL
Anion gap: 6 (ref 5–15)
BUN: 27 mg/dL — ABNORMAL HIGH (ref 8–23)
CO2: 25 mmol/L (ref 22–32)
Calcium: 8.9 mg/dL (ref 8.9–10.3)
Chloride: 105 mmol/L (ref 98–111)
Creatinine, Ser: 1.24 mg/dL — ABNORMAL HIGH (ref 0.44–1.00)
GFR, Estimated: 41 mL/min — ABNORMAL LOW (ref >60)
Glucose, Bld: 126 mg/dL — ABNORMAL HIGH (ref 70–99)
Potassium: 3.9 mmol/L (ref 3.5–5.1)
Sodium: 136 mmol/L (ref 135–145)

## 2020-12-20 LAB — TYPE AND SCREEN
ABO/RH(D): O POS
Antibody Screen: NEGATIVE

## 2020-12-20 LAB — CBC
HCT: 24.9 % — ABNORMAL LOW (ref 36.0–46.0)
Hemoglobin: 8 g/dL — ABNORMAL LOW (ref 12.0–15.0)
MCH: 29.6 pg (ref 26.0–34.0)
MCHC: 32.1 g/dL (ref 30.0–36.0)
MCV: 92.2 fL (ref 80.0–100.0)
Platelets: 151 10*3/uL (ref 150–400)
RBC: 2.7 MIL/uL — ABNORMAL LOW (ref 3.87–5.11)
RDW: 13.3 % (ref 11.5–15.5)
WBC: 10.7 10*3/uL — ABNORMAL HIGH (ref 4.0–10.5)
nRBC: 0 % (ref 0.0–0.2)

## 2020-12-20 LAB — URINE CULTURE: Culture: 80000 — AB

## 2020-12-20 NOTE — Progress Notes (Signed)
Lower extremity venous bilateral study completed.   Please see CV Proc for preliminary results.   Sharese Manrique, RDMS, RVT  

## 2020-12-20 NOTE — Progress Notes (Signed)
OT Evaluation  PTA Pt lives in a ALF and ambulated @ modified independent level using her rollator. Family states she completed her own bathing and dressing. Able to progress to [facility] then to recliner with max A. Max A with LB ADL. Visited room later and family states pt was able to mobilize with staff "much easier" after she had been up in the chair. Recommend pt DC back to ALF with assistance of staff and follow up with [place]. Will follow acutely.    12/20/20 1354  OT Visit Information  Last OT Received On 12/20/20  Assistance Needed +2  PT/OT/SLP Co-Evaluation/Treatment Yes  Reason for Co-Treatment Complexity of the patient's impairments (multi-system involvement);Necessary to address cognition/behavior during functional activity;To address functional/ADL transfers  OT goals addressed during session ADL's and self-care  History of Present Illness Pt is a 87 y.o. F admitted from ALF after a fall with large soft tissue hematoma over low back. Significant PMH: dementia, HTN, recurrent UTI, CKD stage III, depression, DVT.  Precautions  Precautions Fall  Home Living  Family/patient expects to be discharged to: Assisted living  Prior Function  Level of Independence Needs assistance  Gait / Transfers Assistance Needed using rollator  ADL's / Homemaking Assistance Needed pt comleting her own bathing/dressing and taking herself to the toilet  Communication  Communication HOH  Pain Assessment  Pain Assessment Faces  Faces Pain Scale 6  Pain Location pt pointing to left thigh  Pain Descriptors / Indicators Guarding;Grimacing  Cognition  Arousal/Alertness Awake/alert  Behavior During Therapy WFL for tasks assessed/performed  Overall Cognitive Status History of cognitive impairments - at baseline  General Comments Pt with dementia at baseline; no agitation this session, perseverating on being cold. Able to follow 1 step commands fairly consistently  Upper Extremity Assessment  Upper Extremity  Assessment Generalized weakness  Lower Extremity Assessment  Lower Extremity Assessment Defer to PT evaluation  Cervical / Trunk Assessment  Cervical / Trunk Assessment Kyphotic (posterior bias)  ADL  Overall ADL's  Needs assistance/impaired  Eating/Feeding Minimal assistance  Eating/Feeding Details (indicate cue type and reason) poor PO intake; able to bring utensil/cup to mouth  Grooming Minimal assistance;Sitting  Grooming Details (indicate cue type and reason) mod VC  Upper Body Bathing Minimal assistance  Lower Body Bathing Maximal assistance;Sit to/from stand  Upper Body Dressing  Minimal assistance;Sitting  Lower Body Dressing Maximal assistance;Sit to/from Control and instrumentation engineer for safety/equipment  Toileting- Clothing Manipulation and Hygiene Maximal assistance;Sit to/from stand  Toileting - Clothing Manipulation Details (indicate cue type and reason) incontinenet; using a brief  Functional mobility during ADLs Maximal assistance;+2 for safety/equipment;Cueing for safety;Cueing for sequencing  General ADL Comments posteiror bias during all mobility  Vision- History  Baseline Vision/History Wears glasses  Bed Mobility  Overal bed mobility Needs Assistance  Bed Mobility Supine to Sit  Supine to sit Min assist  General bed mobility comments MinA for trunk to upright, cues for initiation  Transfers  Overall transfer level Needs assistance  Equipment used None  Transfers Sit to/from [employer] Transfers  Sit to Stand Mod assist;+2 safety/equipment  Stand pivot transfers Max assist;+2 safety/equipment  General transfer comment Pt requiring mod-maxA (+2 safety) for stand pivot transfer from bed > BSC and BSC> recliner. Cues for stepping initiation, sequencing, direction. Pt with decreased safety awareness and attempting to sit pre-emptively  Balance  Overall balance assessment Needs assistance  Sitting-balance support Feet supported  Sitting  balance-Leahy Scale Fair  Standing balance support Bilateral upper  extremity supported  Standing balance-Leahy Scale Poor  Standing balance comment ModA static standing  General Comments  General comments (skin integrity, edema, etc.) large bruise on lower back; RUE bleeding at IV site  OT - End of Session  Equipment Utilized During Treatment Gait belt  Activity Tolerance Patient tolerated treatment well  Patient left in chair;with call bell/phone within reach;with chair alarm set;with family/visitor present  Nurse Communication Mobility status  OT Assessment  OT Recommendation/Assessment Patient needs continued OT Services  OT Visit Diagnosis Unsteadiness on feet (R26.81);Other abnormalities of gait and mobility (R26.89);Muscle weakness (generalized) (M62.81);History of falling (Z91.81);Other symptoms and signs involving cognitive function;Pain  Pain - Right/Left Left  Pain - part of body Leg (back)  OT Problem List Decreased strength;Decreased range of motion;Decreased activity tolerance;Impaired balance (sitting and/or standing);Decreased coordination;Decreased cognition;Decreased safety awareness;Decreased knowledge of use of DME or AE;Pain  OT Plan  OT Frequency (ACUTE ONLY) Min 2X/week  OT Treatment/Interventions (ACUTE ONLY) Self-care/ADL training;Therapeutic exercise;Neuromuscular education;Energy conservation;DME and/or AE instruction;Therapeutic activities;Cognitive remediation/compensation;Patient/family education;Balance training  AM-PAC OT "6 Clicks" Daily Activity Outcome Measure (Version 2)  Help from another person eating meals? 3  Help from another person taking care of personal grooming? 3  Help from another person toileting, which includes using toliet, bedpan, or urinal? 2  Help from another person bathing (including washing, rinsing, drying)? 2  Help from another person to put on and taking off regular upper body clothing? 3  Help from another person to put on and  taking off regular lower body clothing? 2  6 Click Score 15  OT Recommendation  Follow Up Recommendations Supervision/Assistance - 24 hour (REturn to ALF)  OT Equipment None recommended by OT  Individuals Consulted  Consulted and Agree with Results and Recommendations Patient;Family member/caregiver  Family Member Consulted daughter  Acute Rehab OT Goals  Patient Stated Goal family would like to return to  OT Goal Formulation With patient/family  Time For Goal Achievement 01/03/21  Potential to Achieve Goals Good  OT Time Calculation  OT Start Time (ACUTE ONLY) 1142  OT Stop Time (ACUTE ONLY) 1214  OT Time Calculation (min) 32 min  OT General Charges  $OT Visit 1 Visit  OT Evaluation  $OT Eval Moderate Complexity 1 Mod  Written Expression  Dominant Hand Right  Maurie Boettcher, OT/L   Acute OT Clinical Specialist Acute Rehabilitation Services Pager [phone] Office [phone]

## 2020-12-20 NOTE — Progress Notes (Signed)
Physical Therapy Treatment Patient Details Name: Kelly Freeman MRN: [id number] DOB: [birth year] Today's Date: 12/20/2020    History of Present Illness Pt is a 87 y.o. F admitted from SNF after a fall with large soft tissue hematoma over low back. Significant PMH: dementia, HTN, recurrent UTI, CKD stage III, depression, DVT.    PT Comments    Pt with much improved agitation and restlessness compared to initial evaluation. Received eating lunch with family assist; pt pleasant and following one step commands (benefits from having family present to provide encouragement). Pt requiring mod-max assist for stand pivot transfers to and from bedside commode. Limited in further ambulation due to pain. Pt family reporting that ALF at [facility] can provide necessary assist to return to familiar living environment. Will continue to follow acutely to progress as tolerated.     Follow Up Recommendations  Home health PT;Supervision/Assistance - 24 hour (at ALF)     Equipment Recommendations  None recommended by PT    Recommendations for Other Services       Precautions / Restrictions Precautions Precautions: Fall Restrictions Weight Bearing Restrictions: No    Mobility  Bed Mobility Overal bed mobility: Needs Assistance Bed Mobility: Supine to Sit     Supine to sit: Min assist     General bed mobility comments: MinA for trunk to upright, cues for initiation    Transfers Overall transfer level: Needs assistance Equipment used: None Transfers: Sit to/from [facility] Sit to Stand: Mod assist;+2 safety/equipment Stand pivot transfers: Max assist;+2 safety/equipment       General transfer comment: Pt requiring mod-maxA (+2 safety) for stand pivot transfer from bed > BSC and BSC> recliner. Cues for stepping initiation, sequencing, direction. Pt with decreased safety awareness and attempting to sit pre-emptively  Ambulation/Gait                 Stairs              Wheelchair Mobility    Modified Rankin (Stroke Patients Only)       Balance Overall balance assessment: Needs assistance Sitting-balance support: Feet supported Sitting balance-Leahy Scale: Fair     Standing balance support: Bilateral upper extremity supported Standing balance-Leahy Scale: Poor Standing balance comment: ModA static standing                            Cognition Arousal/Alertness: Awake/alert Behavior During Therapy: WFL for tasks assessed/performed Overall Cognitive Status: History of cognitive impairments - at baseline                                 General Comments: Pt with dementia at baseline; no agitation this session, perseverating on being cold. Able to follow 1 step commands fairly consistently      Exercises      General Comments        Pertinent Vitals/Pain Pain Assessment: Faces Faces Pain Scale: Hurts even more Pain Location: pt pointing to left thigh Pain Descriptors / Indicators: Guarding;Grimacing Pain Intervention(s): Monitored during session    Home Living                      Prior Function            PT Goals (current goals can now be found in the care plan section) Acute Rehab PT Goals Patient Stated Goal: pt family would like  her to return to [facility] PT Goal Formulation: With patient/family Time For Goal Achievement: 01/01/21 Potential to Achieve Goals: Fair Progress towards PT goals: Progressing toward goals    Frequency    Min 3X/week      PT Plan Discharge plan needs to be updated    Co-evaluation PT/OT/SLP Co-Evaluation/Treatment: Yes Reason for Co-Treatment: Necessary to address cognition/behavior during functional activity;For patient/therapist safety;To address functional/ADL transfers PT goals addressed during session: Mobility/safety with mobility;Balance        AM-PAC PT "6 Clicks" Mobility   Outcome Measure  Help needed turning from your back  to your side while in a flat bed without using bedrails?: A Little Help needed moving from lying on your back to sitting on the side of a flat bed without using bedrails?: A Little Help needed moving to and from a bed to a chair (including a wheelchair)?: Total Help needed standing up from a chair using your arms (e.g., wheelchair or bedside chair)?: A Lot Help needed to walk in hospital room?: Total Help needed climbing 3-5 steps with a railing? : Total 6 Click Score: 11    End of Session Equipment Utilized During Treatment: Gait belt Activity Tolerance: Patient tolerated treatment well Patient left: with call bell/phone within reach;in chair;with chair alarm set;with family/visitor present Nurse Communication: Mobility status PT Visit Diagnosis: Pain Pain - part of body:  (back)     Time: 4235-3614 PT Time Calculation (min) (ACUTE ONLY): 31 min  Charges:  $Therapeutic Activity: 8-22 mins                    Wyona Almas, PT, DPT Acute Rehabilitation Services Pager [phone] Office [phone]    Deno Etienne 12/20/2020, 1:45 PM

## 2020-12-20 NOTE — Progress Notes (Signed)
PROGRESS NOTE    Kelly Freeman  QIO:[id number]  DOB: 1927-01-03  PCP: Sharion Balloon, FNP Admit date:12/17/2020 Chief compliant: Back pain, fall at assisted living facility.  87 y.o. female with medical history significant for dementia, hypertension, recurrent UTI, CKD stage III, depression and history of DVT on Xarelto who presents to the [facility] ED via EMS from ALF due to back pain after a mechanical fall. patient fell on buttocks while trying to position her into a chair, patient ambulates with a walker at baseline.  No reported head injury.  In the emergency room, patient was afebrile.  AKI on CKD stage IIIa.  CT scan of the abdomen pelvis showed large soft tissue hematoma in the subcutaneous fat over the low back area, no other skeletal injury.  Also found to have UTI.  Anticoagulation reversal with Kcentra and admitted to the [facility] due to anticipated advance care and surgery.  Subjective:.  Patient seen and examined.  Her 2 daughters were at the bedside.  Overnight some agitation noted however better with a dose of Ativan and pain medication in the morning, she is also on a scheduled Zyprexa.  She is better with her family at the bedside. Patient tells me that her left side of the neck is hurting.  She is also holding her left thigh and complaining of pain. X-ray of the pelvis was done that was without any evidence of pelvic fracture or proximal femoral fractures.  Objective: Vitals:   12/19/20 2348 12/20/20 0353 12/20/20 0830 12/20/20 1112  BP: (!) 104/41 (!) 140/54  (!) 144/69  Pulse: 65 60  85  Resp: 10 13  (!) 23  Temp: (!) 97.1 F (36.2 C) (!) 96.9 F (36.1 C) 97.9 F (36.6 C) 97.8 F (36.6 C)  TempSrc: Axillary Axillary Axillary Axillary  SpO2: 100% 96% 99% 99%  Weight:      Height:        Intake/Output Summary (Last 24 hours) at 12/20/2020 1425 Last data filed at 12/20/2020 1120 Gross per 24 hour  Intake 1506.16 ml  Output 750 ml  Net 756.16 ml   Filed  Weights   12/17/20 2043 12/18/20 0500  Weight: 73 kg 66.7 kg    Physical Examination:  General: Frail and debilitated female laying in bed.  On room air.  She is anxious and in mild distress with pain all over but is not on any acute distress. Cardiovascular: S1-S2 normal.  No added sounds. Respiratory: Bilateral clear.  Some conducted airway noise. Gastrointestinal: Soft and nontender.  Bowel sounds present. Ext:  Patient has soft palpable hematoma along the left lower back overlying the iliac wing She has some muscle tenderness on the left lateral thigh.        Data Reviewed: I have personally reviewed following labs and imaging studies  CBC: Recent Labs  Lab 12/17/20 2200 12/18/20 0129 12/18/20 0858 12/18/20 1727 12/19/20 1002 12/20/20 0759  WBC 8.7  --  11.3*  --  10.2 10.7*  NEUTROABS 6.0  --   --   --   --   --   HGB 11.1* 9.6* 9.3* 8.5* 8.1* 8.0*  HCT 35.4* 30.4* 29.5* 26.2* 25.8* 24.9*  MCV 94.7  --  93.7  --  93.8 92.2  PLT 161  --  158  --  141* 324   Basic Metabolic Panel: Recent Labs  Lab 12/17/20 2200 12/18/20 0858 12/19/20 1002 12/20/20 0759  NA 136 137 139 136  K 4.2 4.4 4.0 3.9  CL 101 104 106 105  CO2 28 24 25 25   GLUCOSE 111* 112* 133* 126*  BUN 54* 45* 35* 27*  CREATININE 1.65* 1.52* 1.25* 1.24*  CALCIUM 8.9 8.8* 9.2 8.9   GFR: Estimated Creatinine Clearance: 28.7 mL/min (A) (by C-G formula based on SCr of 1.24 mg/dL (H)). Liver Function Tests: No results for input(s): AST, ALT, ALKPHOS, BILITOT, PROT, ALBUMIN in the last 168 hours. No results for input(s): LIPASE, AMYLASE in the last 168 hours. No results for input(s): AMMONIA in the last 168 hours. Coagulation Profile: Recent Labs  Lab 12/18/20 0129  INR 1.2   Cardiac Enzymes: No results for input(s): CKTOTAL, CKMB, CKMBINDEX, TROPONINI in the last 168 hours. BNP (last 3 results) No results for input(s): PROBNP in the last 8760 hours. HbA1C: No results for input(s): HGBA1C  in the last 72 hours. CBG: Recent Labs  Lab 12/17/20 2114 12/18/20 1626  GLUCAP 116* 131*   Lipid Profile: No results for input(s): CHOL, HDL, LDLCALC, TRIG, CHOLHDL, LDLDIRECT in the last 72 hours. Thyroid Function Tests: No results for input(s): TSH, T4TOTAL, FREET4, T3FREE, THYROIDAB in the last 72 hours. Anemia Panel: No results for input(s): VITAMINB12, FOLATE, FERRITIN, TIBC, IRON, RETICCTPCT in the last 72 hours. Sepsis Labs: No results for input(s): PROCALCITON, LATICACIDVEN in the last 168 hours.  Recent Results (from the past 240 hour(s))  Resp Panel by RT-PCR (Flu A&B, Covid) Nasopharyngeal Swab     Status: None   Collection Time: 12/18/20  1:05 AM   Specimen: Nasopharyngeal Swab; Nasopharyngeal(NP) swabs in vial transport medium  Result Value Ref Range Status   SARS Coronavirus 2 by RT PCR NEGATIVE NEGATIVE Final    Comment: (NOTE) SARS-CoV-2 target nucleic acids are NOT DETECTED.  The SARS-CoV-2 RNA is generally detectable in upper respiratory specimens during the acute phase of infection. The lowest concentration of SARS-CoV-2 viral copies this assay can detect is 138 copies/mL. A negative result does not preclude SARS-Cov-2 infection and should not be used as the sole basis for treatment or other patient management decisions. A negative result may occur with  improper specimen collection/handling, submission of specimen other than nasopharyngeal swab, presence of viral mutation(s) within the areas targeted by this assay, and inadequate number of viral copies(<138 copies/mL). A negative result must be combined with clinical observations, patient history, and epidemiological information. The expected result is Negative.  Fact Sheet for Patients:  EntrepreneurPulse.com.au  Fact Sheet for Healthcare Providers:  IncredibleEmployment.be  This test is no t yet approved or cleared by the [place] FDA and  has been authorized  for detection and/or diagnosis of SARS-CoV-2 by FDA under an Emergency Use Authorization (EUA). This EUA will remain  in effect (meaning this test can be used) for the duration of the COVID-19 declaration under Section 564(b)(1) of the Act, 21 U.S.C.section 360bbb-3(b)(1), unless the authorization is terminated  or revoked sooner.       Influenza A by PCR NEGATIVE NEGATIVE Final   Influenza B by PCR NEGATIVE NEGATIVE Final    Comment: (NOTE) The Xpert Xpress SARS-CoV-2/FLU/RSV plus assay is intended as an aid in the diagnosis of influenza from Nasopharyngeal swab specimens and should not be used as a sole basis for treatment. Nasal washings and aspirates are unacceptable for Xpert Xpress SARS-CoV-2/FLU/RSV testing.  Fact Sheet for Patients: EntrepreneurPulse.com.au  Fact Sheet for Healthcare Providers: IncredibleEmployment.be  This test is not yet approved or cleared by the [place] FDA and has been authorized for detection and/or diagnosis of  SARS-CoV-2 by FDA under an Emergency Use Authorization (EUA). This EUA will remain in effect (meaning this test can be used) for the duration of the COVID-19 declaration under Section 564(b)(1) of the Act, 21 U.S.C. section 360bbb-3(b)(1), unless the authorization is terminated or revoked.  Performed at [facility], [address], [place], [place] [address]   Urine Culture     Status: Abnormal   Collection Time: 12/18/20  3:01 AM   Specimen: Urine, Clean Catch  Result Value Ref Range Status   Specimen Description   Final    URINE, CLEAN CATCH Performed at [facility], [address], [place], [place] [address]    Special Requests   Final    NONE Performed at [facility], [address] [place], [place] [id number]    Culture 80,000 COLONIES/mL KLEBSIELLA PNEUMONIAE (A)  Final   Report Status 12/20/2020 FINAL  Final   Organism ID, Bacteria KLEBSIELLA PNEUMONIAE (A)  Final       Susceptibility   Klebsiella pneumoniae - MIC*    AMPICILLIN RESISTANT Resistant     CEFAZOLIN <=4 SENSITIVE Sensitive     CEFEPIME <=0.12 SENSITIVE Sensitive     CEFTRIAXONE <=0.25 SENSITIVE Sensitive     CIPROFLOXACIN <=0.25 SENSITIVE Sensitive     GENTAMICIN <=1 SENSITIVE Sensitive     IMIPENEM <=0.25 SENSITIVE Sensitive     NITROFURANTOIN 128 RESISTANT Resistant     TRIMETH/SULFA >=320 RESISTANT Resistant     AMPICILLIN/SULBACTAM <=2 SENSITIVE Sensitive     PIP/TAZO <=4 SENSITIVE Sensitive     * 80,000 COLONIES/mL KLEBSIELLA PNEUMONIAE      Radiology Studies: CT HEAD WO CONTRAST  Result Date: 12/18/2020 CLINICAL DATA:  Delirium.  Fall. EXAM: CT HEAD WITHOUT CONTRAST TECHNIQUE: Contiguous axial images were obtained from the base of the skull through the vertex without intravenous contrast. COMPARISON:  05/13/2020 FINDINGS: Brain: There is no evidence of an acute infarct, intracranial hemorrhage, mass, midline shift, or extra-axial fluid collection. Patchy and confluent hypodensities in the cerebral white matter bilaterally are stable to mildly progressive compared to the prior CT and are nonspecific but compatible with extensive chronic small vessel ischemic disease. There is mild-to-moderate cerebral atrophy. Vascular: Calcified atherosclerosis at the skull base. Skull: No acute fracture or suspicious osseous lesion. Sinuses/Orbits: Mild bilateral ethmoid air cell mucosal thickening. Clear mastoid air cells. Bilateral cataract extraction. Other: None. IMPRESSION: 1. No evidence of acute intracranial abnormality. 2. Extensive chronic small vessel ischemic disease. Electronically Signed   By: Logan Bores M.D.   On: 12/18/2020 19:15   DG HIP UNILAT WITH PELVIS 2-3 VIEWS LEFT  Result Date: 12/20/2020 CLINICAL DATA:  Left hip pain after fall 2 days ago. EXAM: DG HIP (WITH OR WITHOUT PELVIS) 2-3V LEFT COMPARISON:  None. FINDINGS: There is no evidence of hip fracture or dislocation. Moderate  narrowing and osteophyte formation is seen involving both hips. IMPRESSION: Moderate degenerative joint disease is noted bilaterally. No acute abnormality is noted. Electronically Signed   By: Marijo Conception M.D.   On: 12/20/2020 13:31      Scheduled Meds: . acetaminophen  1,000 mg Oral Q8H  . divalproex  125 mg Oral Q12H  . feeding supplement  1 Container Oral TID BM  . ferrous sulfate  325 mg Oral Daily  . lidocaine  1 patch Transdermal Q24H  . melatonin  5 mg Oral QHS  . oxyCODONE  2.5 mg Oral Q8H  . sertraline  60 mg Oral q morning  . traZODone  50  mg Oral QHS   Continuous Infusions: . cefTRIAXone (ROCEPHIN)  IV 1 g (12/19/20 2112)  . dextrose 5 % and 0.9% NaCl 75 mL/hr at 12/20/20 0700     Assessment/Plan:  1.  Mechanical fall, soft tissue hematoma on low back: CT abd/pelvis with large hematoma in subcutaneous fat as above.  Hemoglobin somewhat downtrending from baseline 9-10-8.5-8.  Ordered scheduled acetaminophen, Lidoderm patch and as low-dose oxycodone.   Tramadol as needed.  Appreciate palliative team helping with medication management.  2.  Delirium: Likely multifactorial with underlying dementia, back pain, UTI, hospitalization with unfamiliar surroundings.  CT head negative.  Address pain, standard delirium precautions.  Treating UTI.  Avoid benzodiazepines.  Better with scheduled Zyprexa.  3.  UTI: Present on admission.  Recurrent UTI.  On ceftriaxone.  Urine culture with Klebsiella sensitive to ceftriaxone.  Continue while in the hospital, will treat with 7 days of therapy.  Change to oral on discharge.   4.  Acute blood loss anemia on chronic anemia: Continue to monitor hemoglobin, there might be a dilutional component as well.  Family consented for PRBC transfusion if hemoglobin drops less than 7.  5.  History of DVT: According to granddaughter patient had a fall around 6 months back and was hospitalized at which time she was diagnosed with a blood clot in her leg.   Xarelto now held in concern for problem #1.  Discussed with family regarding care goals and risk of worsening bleeding with resumption of anticoagulation versus recurrent DVT without.  Lower extremity duplexes were ordered, if active blood clot, will need IVC filter.  Not a candidate to go back on anticoagulation.  6.  CKD stage IIIa: At about baseline.  7. Hypertension: Poor appetite.  On as needed medications.  Holding regular antihypertensives.  8. Dementia/depression: Resume home meds-Seroquel, Depakote, melatonin and trazodone.    Goal of care: Followed by palliative.  Trying to manage symptoms with scheduled Zyprexa, scheduled oxycodone and other pain medications.  Delirium precautions. To work with PT OT today.  Family anticipates she can go back to assisted living facility.  Will need PT OT on discharge. DNR. With poor appetite, pending clinical improvement remains on maintenance dextrose infusion.  DVT prophylaxis: SCDs. Code Status: DNR Family / Patient Communication: Daughter was at the bedside.  disposition Plan: Back to ALF. Status is: Inpatient  Remains inpatient appropriate because:Altered mental status   Dispo: The patient is from: ALF              Anticipated d/c is to: ALF              Patient currently is not medically stable to d/c.   Difficult to place patient No  Time spent: 32 minutes    >50% time spent in discussions with care team and coordination of care.  Barb Merino, MD [facility] Pager in [place]  If 7PM-7AM, please contact night-coverage [email or web address] 12/20/2020, 2:25 PM

## 2020-12-21 DIAGNOSIS — Z515 Encounter for palliative care: Secondary | ICD-10-CM

## 2020-12-21 DIAGNOSIS — Z7189 Other specified counseling: Secondary | ICD-10-CM

## 2020-12-21 DIAGNOSIS — R52 Pain, unspecified: Secondary | ICD-10-CM

## 2020-12-21 LAB — BASIC METABOLIC PANEL
Anion gap: 9 (ref 5–15)
BUN: 30 mg/dL — ABNORMAL HIGH (ref 8–23)
CO2: 27 mmol/L (ref 22–32)
Calcium: 9.1 mg/dL (ref 8.9–10.3)
Chloride: 102 mmol/L (ref 98–111)
Creatinine, Ser: 1.32 mg/dL — ABNORMAL HIGH (ref 0.44–1.00)
GFR, Estimated: 38 mL/min — ABNORMAL LOW (ref >60)
Glucose, Bld: 103 mg/dL — ABNORMAL HIGH (ref 70–99)
Potassium: 4.3 mmol/L (ref 3.5–5.1)
Sodium: 138 mmol/L (ref 135–145)

## 2020-12-21 LAB — CBC
HCT: 23.3 % — ABNORMAL LOW (ref 36.0–46.0)
Hemoglobin: 7.4 g/dL — ABNORMAL LOW (ref 12.0–15.0)
MCH: 29.6 pg (ref 26.0–34.0)
MCHC: 31.8 g/dL (ref 30.0–36.0)
MCV: 93.2 fL (ref 80.0–100.0)
Platelets: 160 10*3/uL (ref 150–400)
RBC: 2.5 MIL/uL — ABNORMAL LOW (ref 3.87–5.11)
RDW: 13.4 % (ref 11.5–15.5)
WBC: 10.6 10*3/uL — ABNORMAL HIGH (ref 4.0–10.5)
nRBC: 0 % (ref 0.0–0.2)

## 2020-12-21 LAB — APTT: aPTT: 32 seconds (ref 24–36)

## 2020-12-21 MED ORDER — OXYCODONE HCL 5 MG PO TABS: 2.5000 mg | ORAL_TABLET | Freq: Three times a day (TID) | ORAL | 0 refills | Status: AC | PRN

## 2020-12-21 MED ORDER — LIDOCAINE 5 % EX PTCH: 1.0000 | MEDICATED_PATCH | CUTANEOUS | 0 refills | Status: DC

## 2020-12-21 MED ORDER — SODIUM CHLORIDE 0.9 % IV SOLN
510.0000 mg | Freq: Once | INTRAVENOUS | Status: DC
  Filled 2020-12-21: qty 17

## 2020-12-21 MED ORDER — CEFDINIR 300 MG PO CAPS
300.0000 mg | ORAL_CAPSULE | Freq: Two times a day (BID) | ORAL | Status: DC
  Administered 2020-12-21 – 2020-12-22 (×2): 300 mg via ORAL
  Filled 2020-12-21 (×4): qty 1

## 2020-12-21 MED ORDER — CEFDINIR 300 MG PO CAPS: 300.0000 mg | ORAL_CAPSULE | Freq: Two times a day (BID) | ORAL | 0 refills | Status: AC

## 2020-12-21 MED ORDER — OXYCODONE HCL 5 MG PO TABS
2.5000 mg | ORAL_TABLET | Freq: Three times a day (TID) | ORAL | Status: DC | PRN
  Administered 2020-12-22: 2.5 mg via ORAL
  Filled 2020-12-21 (×2): qty 1

## 2020-12-21 NOTE — Progress Notes (Signed)
Daily Progress Note   Patient Name: Kelly Freeman       Date: 12/21/2020 DOB: 10/12/1926  Age: 87 y.o. MRN#: [id number] Attending Physician: Kelly Merino, MD Primary Care Physician: Kelly Balloon, FNP Admit Date: 12/17/2020  Reason for Consultation/Follow-up: Establishing goals of care  Subjective: Medical records reviewed. Discussed with RN Kelly Freeman and assessed patient at the bedside. She is confused and repeatedly states "I want to go home." Reports pain in the left leg. Per RN, patient has pulled out her IV twice and exhibits sundowning behaviors overnight. She has been calmer later in the morning and afternoon. No family present during first bedside evaluation.  Returned to the bedside to discuss goals of care with patient's daughter Kelly Freeman and granddaughter Kelly Freeman. Kelly Freeman is resting comfortably and smiling, appears much less agitated than earlier. Family confirms that patient's pain seems better controlled with scheduled Oxycodone and Tylenol. She was active and ambulating around the room with assistance yesterday. They are also pleased with Kelly Freeman's increased oral intake this morning. I shared concern for patient's long-term nutrition and hydration, given her confusion and refusal of medications or IV fluids. Both Kelly Freeman and Kelly Freeman strongly feel that patient's appetite is improving, which they find encouraging given lack of foods and beverages that the patient usually prefers at ALF. They speak to her strong-willed nature and determination "to get her way." Patient's agitated behaviors have been chronic and they both feel able to continue managing at ALF with specific phrases and methods of redirection that are unique to Kelly Freeman. They note a history of worsening behaviors during hospitalizations.    We discussed patient's outpatient dementia management, expected trajectory, and signs and symptoms of end-stage dementia. Kelly Freeman and Kelly Freeman state "we're not in denial, we know she's 45 and we're lucky she has lived this long." However, they feel patient has not had any recent or drastic changes in eating/drinking habits, agitation, or mobility to indicate progression to end-stage dementia. Patient continues to play piano, use her walker, and and the primary concern is currently the impact of her UTI on acute confusion. We discussed the high risk of sudden dementia progression during hospitalization and acute delirium. They voice willingness to continue monitoring for decline and decide on a transition to hospice with patient's PCP when needed, but they are not ready at this point. They  confirm they have made the decision for DNR. They are open to blood transfusions and minor procedures such as IVC filters. Family is honoring patient's wishes by continuing with current interventions. Comfort and keeping the patient in her regular environment are also the priorities.      Questions and concerns addressed. PMT will continue to support holistically.   Length of Stay: 3  Current Medications: Scheduled Meds:  . acetaminophen  1,000 mg Oral Q8H  . cefdinir  300 mg Oral Q12H  . divalproex  125 mg Oral Q12H  . feeding supplement  1 Container Oral TID BM  . ferrous sulfate  325 mg Oral Daily  . lidocaine  1 patch Transdermal Q24H  . melatonin  5 mg Oral QHS  . oxyCODONE  2.5 mg Oral Q8H  . sertraline  60 mg Oral q morning  . traZODone  50 mg Oral QHS    Continuous Infusions:   PRN Meds: acetaminophen, hydrALAZINE, LORazepam, metoprolol tartrate, morphine injection, OLANZapine zydis, traMADol  Physical Exam Vitals and nursing note reviewed.  Cardiovascular:     Rate and Rhythm: Normal rate.  Pulmonary:     Effort: Pulmonary effort is normal.  Neurological:     Mental Status: She is alert.  She is disoriented.  Psychiatric:        Behavior: Behavior is agitated.             Vital Signs: BP (!) 151/80 (BP Location: Right Arm)   Pulse 97   Temp 97.7 F (36.5 C) (Axillary)   Resp 20   Ht 5\' 7"  (1.702 m)   Wt 66.7 kg   SpO2 97%   BMI 23.03 kg/m  SpO2: SpO2: 97 % O2 Device: O2 Device: Room Air O2 Flow Rate:    Intake/output summary:   Intake/Output Summary (Last 24 hours) at 12/21/2020 1327 Last data filed at 12/21/2020 1132 Gross per 24 hour  Intake 610 ml  Output --  Net 610 ml   LBM:   Baseline Weight: Weight: 73 kg Most recent weight: Weight: 66.7 kg    Patient Active Problem List   Diagnosis Date Noted  . Hematoma, nontraumatic, soft tissue 12/18/2020  . Fall at home, initial encounter 12/18/2020  . History of DVT (deep vein thrombosis) 12/18/2020  . Type 2 diabetes mellitus with kidney complication, with long-term current use of insulin ([place])   . Recurrent falls   . Moderate episode of recurrent major depressive disorder ([place])   . MGUS (monoclonal gammopathy of unknown significance)   . Hypertension   . History of recurrent UTIs   . CKD (chronic kidney disease) stage 3, GFR 30-59 ml/min [facility])     Palliative Care Assessment & Plan   Patient Profile: 87 y.o. female  with past medical history of dementia, CKD 2, MGUS, DM, depression, peripheral neuropathy, DDD, recurrent UTI, DVT no longer on anticoagulation due to falls, who was admitted on 12/17/2020 with after a fall at [facility].   She has a very large hematoma over her low back 8x32 cm as well as multiple other bruises.    Assessment: Dementia/delirium  UTI Fall with hematoma on lower back Acute on chronic anemia CKD3A  Recommendations/Plan:  Family desires to continue with current interventions and return to ALF with home health services  Please send with gold DNR form upon discharge for family's records  Declined outpatient palliative care referral, family will discuss hospice  with patient's PCP if she declines further  Continue with current symptom  management plan to address pain and delirium  PMT will continue to support holistically  Goals of Care and Additional Recommendations:  Limitations on Scope of Treatment: Full Scope Treatment  Code Status: DNR   Code Status Orders  (From admission, onward)         Start     Ordered   12/18/20 [id number]  Do not attempt resuscitation (DNR)  Continuous       Question Answer Comment  In the event of cardiac or respiratory ARREST Do not call a "code blue"   In the event of cardiac or respiratory ARREST Do not perform Intubation, CPR, defibrillation or ACLS   In the event of cardiac or respiratory ARREST Use medication by any route, position, wound care, and other measures to relive pain and suffering. May use oxygen, suction and manual treatment of airway obstruction as needed for comfort.      12/18/20 1803        Code Status History    This patient has a current code status but no historical code status.   Advance Care Planning Activity      Prognosis:   < 6 months  Discharge Planning:  Home with Home Health (ALF)  Care plan was discussed with Dr. Sloan Leiter  Total time: 35 minutes  Greater than 50% of this time was spent in counseling and coordinating care related to the above assessment and plan.  Dorthy Cooler, PA-C Palliative Medicine Team Team phone # [phone]  Thank you for allowing the Palliative Medicine Team to assist in the care of this patient. Please utilize secure chat with additional questions, if there is no response within 30 minutes please call the above phone number.  Palliative Medicine Team providers are available by phone from 7am to 7pm daily and can be reached through the team cell phone.  Should this patient require assistance outside of these hours, please call the patient's attending physician.

## 2020-12-21 NOTE — TOC Initial Note (Signed)
Transition of Care [facility]) - Initial/Assessment Note    Patient Details  Name: Kelly Freeman MRN: [id number] Date of Birth: [birth year]  Transition of Care [facility]) CM/SW Contact:    Vinie Sill, LCSW Phone Number: 12/21/2020, 5:16 PM  Clinical Narrative:                  Left voice message with Cathy/Admissions at [facility] to return call.  CSW spoke with the patient's daughter,[name]- she confirmed patient if form [facility] and she is agreeable to patient returning.   CSW will continue to follow and assist with discharge planning.  Thurmond Butts, MSW, LCSW Clinical Social Worker   Expected Discharge Plan: Assisted Living Barriers to Discharge: Continued Medical Work up (ALF confirmation can accept back - out of restraints)   Patient Goals and CMS Choice        Expected Discharge Plan and Services Expected Discharge Plan: Assisted Living In-house Referral: Clinical Social Work     Living arrangements for the past 2 months: [place]                                      Prior Living Arrangements/Services Living arrangements for the past 2 months: [place] Lives with:: Facility Resident Patient language and need for interpreter reviewed:: No        Need for Family Participation in Patient Care: Yes (Comment) Care giver support system in place?: Yes (comment)   Criminal Activity/Legal Involvement Pertinent to Current Situation/Hospitalization: No - Comment as needed  Activities of Daily Living Home Assistive Devices/Equipment: Environmental consultant (specify type) ADL Screening (condition at time of admission) Patient's cognitive ability adequate to safely complete daily activities?: Yes Is the patient deaf or have difficulty hearing?: No Does the patient have difficulty seeing, even when wearing glasses/contacts?: No Does the patient have difficulty concentrating, remembering, or making decisions?: Yes Patient  able to express need for assistance with ADLs?: Yes Does the patient have difficulty dressing or bathing?: Yes Independently performs ADLs?: No Communication: Appropriate for developmental age Dressing (OT): Needs assistance Is this a change from baseline?: Pre-admission baseline Grooming: Appropriate for developmental age Feeding: Needs assistance Is this a change from baseline?: Pre-admission baseline Bathing: Needs assistance Is this a change from baseline?: Pre-admission baseline Toileting: Needs assistance Is this a change from baseline?: Pre-admission baseline In/Out Bed: Needs assistance Is this a change from baseline?: Pre-admission baseline Walks in Home: Needs assistance Is this a change from baseline?: Pre-admission baseline Does the patient have difficulty walking or climbing stairs?: Yes Weakness of Legs: Both Weakness of Arms/Hands: None  Permission Sought/Granted                  Emotional Assessment       Orientation: : Oriented to Self Alcohol / Substance Use: Not Applicable Psych Involvement: No (comment)  Admission diagnosis:  Hematoma, nontraumatic, soft tissue [M79.81] Paraspinal hematoma [T14.8XXA] Fall, initial encounter [id number]] Patient Active Problem List   Diagnosis Date Noted  . Pain   . Palliative care by specialist   . Goals of care, counseling/discussion   . Hematoma, nontraumatic, soft tissue 12/18/2020  . Fall at home, initial encounter 12/18/2020  . History of DVT (deep vein thrombosis) 12/18/2020  . Type 2 diabetes mellitus with kidney complication, with long-term current use of insulin ([place])   . Recurrent falls   . Moderate episode of recurrent  major depressive disorder ([place])   . MGUS (monoclonal gammopathy of unknown significance)   . Hypertension   . History of recurrent UTIs   . CKD (chronic kidney disease) stage 3, GFR 30-59 ml/min (HCC)    PCP:  Sharion Balloon, FNP Pharmacy:  No Pharmacies Listed    Social  Determinants of Health (SDOH) Interventions    Readmission Risk Interventions No flowsheet data found.

## 2020-12-21 NOTE — Progress Notes (Signed)
PROGRESS NOTE    Kelly Freeman  [id number]  DOB: April 29, 1927  PCP: Sharion Balloon, FNP Admit date:12/17/2020 Chief compliant: Back pain, fall at assisted living facility.  87 y.o. female with medical history significant for dementia, hypertension, recurrent UTI, CKD stage III, depression and history of DVT on Xarelto who presents to the [facility] ED via EMS from ALF due to back pain after a mechanical fall. patient fell on buttocks while trying to position her into a chair, patient ambulates with a walker at baseline.  No reported head injury.  In the emergency room, patient was afebrile.  AKI on CKD stage IIIa.  CT scan of the abdomen pelvis showed large soft tissue hematoma in the subcutaneous fat over the low back area, no other skeletal injury.  Also found to have UTI.  Anticoagulation reversal with Kcentra and admitted to the [facility] due to anticipated advance care and surgery.  Subjective:.  Patient seen and examined in the morning rounds.  She was agitated and keeps saying that she wants to go home.  Gave her some Ativan. I came back to examine patient in the afternoon with family at the bedside.  Her daughter and granddaughter were at the bedside, patient was awake did not want to eat.  She looked comfortable.  Without any restraints.  Wants to go home. We had detailed discussion with the family along with palliative care team for further plan of care. [name] would like her to continue her home medication, change oxycodone to as needed medications and to not change any other mood stabilizers as she was doing. Advised not to insert another IV line, will change Rocephin to [facility] and discharged on total 7 days therapy. Hemoglobin dropped to 7.4, no ongoing evidence of bleeding, however since we are not opening IV line will hold off on giving any IV iron.  Will resume oral iron therapy.  Patient is from assisted living facility memory care unit, will explore if they can  admit her she will be able to go back today.  And family agrees.  Objective: Vitals:   12/20/20 1500 12/20/20 2302 12/21/20 0308 12/21/20 0729  BP: (!) 110/45 (!) 143/53 (!) 111/40 (!) 151/80  Pulse: 79 94 74 97  Resp:  19 14 20   Temp: 97.9 F (36.6 C) 99.1 F (37.3 C) 98.6 F (37 C) 97.7 F (36.5 C)  TempSrc: Axillary Oral Axillary Axillary  SpO2: 100% 97% 98% 97%  Weight:      Height:        Intake/Output Summary (Last 24 hours) at 12/21/2020 1349 Last data filed at 12/21/2020 1132 Gross per 24 hour  Intake 610 ml  Output --  Net 610 ml   Filed Weights   12/17/20 2043 12/18/20 0500  Weight: 73 kg 66.7 kg    Physical Examination:  General: Frail and debilitated female laying in bed.  On room air.  She is anxious.  Looks quite comfortable when she is at rest.  Alert oriented x1.  Without any distress. Cardiovascular: S1-S2 normal.  No added sounds. Respiratory: Bilateral clear.  Some conducted airway noise. Gastrointestinal: Soft and nontender.  Bowel sounds present. Ext:  Patient has soft palpable hematoma along the left lower back overlying the iliac wing No tenderness on physical exam along the extremities.    Data Reviewed: I have personally reviewed following labs and imaging studies  CBC: Recent Labs  Lab 12/17/20 2200 12/18/20 0129 12/18/20 0858 12/18/20 1727 12/19/20 1002 12/20/20 0759 12/21/20  0124  WBC 8.7  --  11.3*  --  10.2 10.7* 10.6*  NEUTROABS 6.0  --   --   --   --   --   --   HGB 11.1*   < > 9.3* 8.5* 8.1* 8.0* 7.4*  HCT 35.4*   < > 29.5* 26.2* 25.8* 24.9* 23.3*  MCV 94.7  --  93.7  --  93.8 92.2 93.2  PLT 161  --  158  --  141* 151 160   < > = values in this interval not displayed.   Basic Metabolic Panel: Recent Labs  Lab 12/17/20 2200 12/18/20 0858 12/19/20 1002 12/20/20 0759 12/21/20 0124  NA 136 137 139 136 138  K 4.2 4.4 4.0 3.9 4.3  CL 101 104 106 105 102  CO2 28 24 25 25 27   GLUCOSE 111* 112* 133* 126* 103*  BUN 54*  45* 35* 27* 30*  CREATININE 1.65* 1.52* 1.25* 1.24* 1.32*  CALCIUM 8.9 8.8* 9.2 8.9 9.1   GFR: Estimated Creatinine Clearance: 27 mL/min (A) (by C-G formula based on SCr of 1.32 mg/dL (H)). Liver Function Tests: No results for input(s): AST, ALT, ALKPHOS, BILITOT, PROT, ALBUMIN in the last 168 hours. No results for input(s): LIPASE, AMYLASE in the last 168 hours. No results for input(s): AMMONIA in the last 168 hours. Coagulation Profile: Recent Labs  Lab 12/18/20 0129  INR 1.2   Cardiac Enzymes: No results for input(s): CKTOTAL, CKMB, CKMBINDEX, TROPONINI in the last 168 hours. BNP (last 3 results) No results for input(s): PROBNP in the last 8760 hours. HbA1C: No results for input(s): HGBA1C in the last 72 hours. CBG: Recent Labs  Lab 12/17/20 2114 12/18/20 1626  GLUCAP 116* 131*   Lipid Profile: No results for input(s): CHOL, HDL, LDLCALC, TRIG, CHOLHDL, LDLDIRECT in the last 72 hours. Thyroid Function Tests: No results for input(s): TSH, T4TOTAL, FREET4, T3FREE, THYROIDAB in the last 72 hours. Anemia Panel: No results for input(s): VITAMINB12, FOLATE, FERRITIN, TIBC, IRON, RETICCTPCT in the last 72 hours. Sepsis Labs: No results for input(s): PROCALCITON, LATICACIDVEN in the last 168 hours.  Recent Results (from the past 240 hour(s))  Resp Panel by RT-PCR (Flu A&B, Covid) Nasopharyngeal Swab     Status: None   Collection Time: 12/18/20  1:05 AM   Specimen: Nasopharyngeal Swab; Nasopharyngeal(NP) swabs in vial transport medium  Result Value Ref Range Status   SARS Coronavirus 2 by RT PCR NEGATIVE NEGATIVE Final    Comment: (NOTE) SARS-CoV-2 target nucleic acids are NOT DETECTED.  The SARS-CoV-2 RNA is generally detectable in upper respiratory specimens during the acute phase of infection. The lowest concentration of SARS-CoV-2 viral copies this assay can detect is 138 copies/mL. A negative result does not preclude SARS-Cov-2 infection and should not be used as the  sole basis for treatment or other patient management decisions. A negative result may occur with  improper specimen collection/handling, submission of specimen other than nasopharyngeal swab, presence of viral mutation(s) within the areas targeted by this assay, and inadequate number of viral copies(<138 copies/mL). A negative result must be combined with clinical observations, patient history, and epidemiological information. The expected result is Negative.  Fact Sheet for Patients:  EntrepreneurPulse.com.au  Fact Sheet for Healthcare Providers:  IncredibleEmployment.be  This test is no t yet approved or cleared by the [place] FDA and  has been authorized for detection and/or diagnosis of SARS-CoV-2 by FDA under an Emergency Use Authorization (EUA). This EUA will remain  in effect (  meaning this test can be used) for the duration of the COVID-19 declaration under Section 564(b)(1) of the Act, 21 U.S.C.section 360bbb-3(b)(1), unless the authorization is terminated  or revoked sooner.       Influenza A by PCR NEGATIVE NEGATIVE Final   Influenza B by PCR NEGATIVE NEGATIVE Final    Comment: (NOTE) The Xpert Xpress SARS-CoV-2/FLU/RSV plus assay is intended as an aid in the diagnosis of influenza from Nasopharyngeal swab specimens and should not be used as a sole basis for treatment. Nasal washings and aspirates are unacceptable for Xpert Xpress SARS-CoV-2/FLU/RSV testing.  Fact Sheet for Patients: EntrepreneurPulse.com.au  Fact Sheet for Healthcare Providers: IncredibleEmployment.be  This test is not yet approved or cleared by the [place] FDA and has been authorized for detection and/or diagnosis of SARS-CoV-2 by FDA under an Emergency Use Authorization (EUA). This EUA will remain in effect (meaning this test can be used) for the duration of the COVID-19 declaration under Section 564(b)(1) of the  Act, 21 U.S.C. section 360bbb-3(b)(1), unless the authorization is terminated or revoked.  Performed at [facility], [address] [place], [place] [address]   Urine Culture     Status: Abnormal   Collection Time: 12/18/20  3:01 AM   Specimen: Urine, Clean Catch  Result Value Ref Range Status   Specimen Description   Final    URINE, CLEAN CATCH Performed at [facility], [address], [place], [place] [address]    Special Requests   Final    NONE Performed at [facility], [address] [place],  [address]    Culture 80,000 COLONIES/mL KLEBSIELLA PNEUMONIAE (A)  Final   Report Status 12/20/2020 FINAL  Final   Organism ID, Bacteria KLEBSIELLA PNEUMONIAE (A)  Final      Susceptibility   Klebsiella pneumoniae - MIC*    AMPICILLIN RESISTANT Resistant     CEFAZOLIN <=4 SENSITIVE Sensitive     CEFEPIME <=0.12 SENSITIVE Sensitive     CEFTRIAXONE <=0.25 SENSITIVE Sensitive     CIPROFLOXACIN <=0.25 SENSITIVE Sensitive     GENTAMICIN <=1 SENSITIVE Sensitive     IMIPENEM <=0.25 SENSITIVE Sensitive     NITROFURANTOIN 128 RESISTANT Resistant     TRIMETH/SULFA >=320 RESISTANT Resistant     AMPICILLIN/SULBACTAM <=2 SENSITIVE Sensitive     PIP/TAZO <=4 SENSITIVE Sensitive     * 80,000 COLONIES/mL KLEBSIELLA PNEUMONIAE      Radiology Studies: DG HIP UNILAT WITH PELVIS 2-3 VIEWS LEFT  Result Date: 12/20/2020 CLINICAL DATA:  Left hip pain after fall 2 days ago. EXAM: DG HIP (WITH OR WITHOUT PELVIS) 2-3V LEFT COMPARISON:  None. FINDINGS: There is no evidence of hip fracture or dislocation. Moderate narrowing and osteophyte formation is seen involving both hips. IMPRESSION: Moderate degenerative joint disease is noted bilaterally. No acute abnormality is noted. Electronically Signed   By: Marijo Conception M.D.   On: 12/20/2020 13:31   VAS [place] LOWER EXTREMITY VENOUS (DVT)  Result Date: 12/21/2020  Lower Venous DVT Study Patient Name:  MEGHA OHLE  Date of Exam:   12/20/2020 Medical  Rec #: [id number]      Accession #:    [id number] Date of Birth: January 26, 1927       Patient Gender: F Patient Age:   89Y Exam Location:  [facility] Procedure:      VAS [place] LOWER EXTREMITY VENOUS (DVT) Referring Phys: [id number] [place] --------------------------------------------------------------------------------  Indications: History of DVT, s/p fall, holding xarelto secondary to fall and resultant injury.  Limitations: Patient condition- pain, agitation, inability to cooperate with repositioning. Comparison Study: No prior studies. Performing Technologist: Darlin Coco RDMS,RVT  Examination Guidelines: A complete evaluation includes B-mode imaging, spectral Doppler, color Doppler, and power Doppler as needed of all accessible portions of each vessel. Bilateral testing is considered an integral part of a complete examination. Limited examinations for reoccurring indications may be performed as noted. The reflux portion of the exam is performed with the patient in reverse Trendelenburg.  +---------+---------------+---------+-----------+----------+-------------------+ RIGHT    CompressibilityPhasicitySpontaneityPropertiesThrombus Aging      +---------+---------------+---------+-----------+----------+-------------------+ CFV      Full           Yes      Yes                                      +---------+---------------+---------+-----------+----------+-------------------+ SFJ      Full                                                             +---------+---------------+---------+-----------+----------+-------------------+ FV Prox  Full                                                             +---------+---------------+---------+-----------+----------+-------------------+ FV Mid   Full                                                             +---------+---------------+---------+-----------+----------+-------------------+ FV DistalFull                                                              +---------+---------------+---------+-----------+----------+-------------------+ PFV      Full                                                             +---------+---------------+---------+-----------+----------+-------------------+ POP      Full           Yes      Yes                  Some segments not                                                         well visualized     +---------+---------------+---------+-----------+----------+-------------------+ PTV  Full                                                             +---------+---------------+---------+-----------+----------+-------------------+ PERO     Full                                                             +---------+---------------+---------+-----------+----------+-------------------+   +---------+---------------+---------+-----------+----------+-------------------+ LEFT     CompressibilityPhasicitySpontaneityPropertiesThrombus Aging      +---------+---------------+---------+-----------+----------+-------------------+ CFV      Full           Yes      Yes                                      +---------+---------------+---------+-----------+----------+-------------------+ SFJ      Full                                                             +---------+---------------+---------+-----------+----------+-------------------+ FV Prox  Full                                                             +---------+---------------+---------+-----------+----------+-------------------+ FV Mid   Full                                                             +---------+---------------+---------+-----------+----------+-------------------+ FV DistalFull                                                             +---------+---------------+---------+-----------+----------+-------------------+ PFV      Full                                                              +---------+---------------+---------+-----------+----------+-------------------+ POP      Full           Yes      Yes                  Some segments not  well visualized.    +---------+---------------+---------+-----------+----------+-------------------+ PTV      Full                                         Some segments not                                                         well visualized     +---------+---------------+---------+-----------+----------+-------------------+ PERO     Full                                         Some segments not                                                         well visualized     +---------+---------------+---------+-----------+----------+-------------------+     Summary: RIGHT: - There is no evidence of deep vein thrombosis in the lower extremity. However, portions of this examination were limited- see technologist comments above.  - No cystic structure found in the popliteal fossa.  LEFT: - There is no evidence of deep vein thrombosis in the lower extremity. However, portions of this examination were limited- see technologist comments above.  - No cystic structure found in the popliteal fossa.  *See table(s) above for measurements and observations. Electronically signed by Deitra Mayo MD on 12/21/2020 at 7:31:55 AM.    Final       Scheduled Meds: . acetaminophen  1,000 mg Oral Q8H  . cefdinir  300 mg Oral Q12H  . divalproex  125 mg Oral Q12H  . feeding supplement  1 Container Oral TID BM  . ferrous sulfate  325 mg Oral Daily  . lidocaine  1 patch Transdermal Q24H  . melatonin  5 mg Oral QHS  . sertraline  60 mg Oral q morning  . traZODone  50 mg Oral QHS   Continuous Infusions:    Assessment/Plan:  1.  Mechanical fall, soft tissue hematoma on low back: CT abd/pelvis with large hematoma in subcutaneous fat as above.   Hemoglobin somewhat downtrending from baseline 9-10-8.5-8-7.4  Ordered scheduled acetaminophen, Lidoderm patch and as low-dose oxycodone as needed. This will remain current pain management as scheduled.  2.  Delirium: Likely multifactorial with underlying dementia, back pain, UTI, hospitalization with unfamiliar surroundings.  CT head negative.  Address pain, standard delirium precautions.  Treating UTI.  Avoid benzodiazepines.  Better with scheduled Zyprexa. Patient is apparently on some type of Haldol cream at ALF, not available with [place].  Continue to use Zyprexa while in the hospital.  3.  UTI: Present on admission.  Recurrent UTI.  On ceftriaxone.  Urine culture with Klebsiella sensitive to ceftriaxone.  Continue while in the hospital, will treat with 7 days of therapy.  Change to oral Omnicef today.  4.  Acute blood loss anemia on chronic anemia: Continue to monitor hemoglobin, there might be a dilutional component as well.  Family consented for PRBC transfusion if  hemoglobin drops less than 7. Resume iron.  5.  History of DVT: According to granddaughter patient had a fall around 6 months back and was hospitalized at which time she was diagnosed with a blood clot in her leg.  Xarelto now held in concern for problem #1.  Discussed with family regarding care goals and risk of worsening bleeding with resumption of anticoagulation versus recurrent DVT without.  Extremity duplex is negative for presence of DVT. Side effects more than benefits of anticoagulation, discontinue all anticoagulation.  6.  CKD stage IIIa: At about baseline.  7. Hypertension: Poor appetite.  On as needed medications.  Will resume low-dose antihypertensives from home.  8. Dementia/depression: Resume home meds-Seroquel, Depakote, melatonin and trazodone.    Goal of care: Followed by palliative.  Trying to manage symptoms with different medications and pain medications.  Family willing her to go back to assisted living  facility so they can work with her, regulate her medications.  Patient has poor appetite and ultimately would benefit with palliative consultation at the assisted living facility.  Family does not desire any further palliative discussions here.   DNR.    Discharge to assisted living facility if they are able to admit her today.  DVT prophylaxis: SCDs. Code Status: DNR Family / Patient Communication: Daughter and granddaughter at the bedside.  disposition Plan: Back to ALF. Status is: Inpatient  Remains inpatient appropriate because:Altered mental status   Dispo: The patient is from: ALF              Anticipated d/c is to: ALF              Patient currently is medically stable.   Difficult to place patient No  Time spent: 32 minutes    >50% time spent in discussions with care team and coordination of care.  Barb Merino, MD [facility] Pager in [place]  If 7PM-7AM, please contact night-coverage [email or web address] 12/21/2020, 1:49 PM

## 2020-12-21 NOTE — Care Management Important Message (Signed)
Important Message  Patient Details  Name: Kelly Freeman MRN: [id number] Date of Birth: [birth year]   Medicare Important Message Given:  Yes     Kelly Freeman 12/21/2020, 3:16 PM

## 2020-12-21 NOTE — Care Management Important Message (Signed)
Important Message  Patient Details  Name: Kelly Freeman MRN: [id number] Date of Birth: [birth year]   Medicare Important Message Given:  Yes     Kelly Freeman 12/21/2020, 3:18 PM

## 2020-12-22 MED ORDER — OXYCODONE HCL 5 MG PO TABS
5.0000 mg | ORAL_TABLET | Freq: Once | ORAL | Status: AC
  Administered 2020-12-22: 5 mg via ORAL
  Filled 2020-12-22: qty 1

## 2020-12-22 NOTE — NC FL2 (Addendum)
[place] LEVEL OF CARE SCREENING TOOL     IDENTIFICATION  Patient Name: Kelly Freeman Birthdate: [birth year] Sex: female Admission Date (Current Location): 12/17/2020  [facility] [place] Number:  [employer] and Address:  [facility], [place] [address] [place], [place] [address]      Provider Number: [id number]  Attending Physician Name and Address:  Barb Merino, MD  Relative Name and Phone Number:       Current Level of Care: Hospital Recommended Level of Care: [place] Prior Approval Number:    Date Approved/Denied:   PASRR Number:    Discharge Plan: Other (Comment) (ALF)    Current Diagnoses: Patient Active Problem List   Diagnosis Date Noted  . Pain   . Palliative care by specialist   . Goals of care, counseling/discussion   . Hematoma, nontraumatic, soft tissue 12/18/2020  . Fall at home, initial encounter 12/18/2020  . History of DVT (deep vein thrombosis) 12/18/2020  . Type 2 diabetes mellitus with kidney complication, with long-term current use of insulin ([place])   . Recurrent falls   . Moderate episode of recurrent major depressive disorder ([place])   . MGUS (monoclonal gammopathy of unknown significance)   . Hypertension   . History of recurrent UTIs   . CKD (chronic kidney disease) stage 3, GFR 30-59 ml/min (HCC)     Orientation RESPIRATION BLADDER Height & Weight     Self  Normal Incontinent Weight: 147 lb 0.8 oz (66.7 kg) Height:  5\' 7"  (170.2 cm)  BEHAVIORAL SYMPTOMS/MOOD NEUROLOGICAL BOWEL NUTRITION STATUS      Continent  (regular diet)  AMBULATORY STATUS COMMUNICATION OF NEEDS Skin   Limited Assist Verbally Surgical wounds (wound/incision, non pressure vetebral column lower)                       Personal Care Assistance Level of Assistance  Bathing,Feeding,Dressing Bathing Assistance: Limited assistance Feeding assistance: Independent Dressing Assistance: Limited  assistance     Functional Limitations Info  Sight,Hearing,Speech Sight Info: Adequate Hearing Info: Impaired Speech Info: Adequate    SPECIAL CARE FACTORS FREQUENCY  PT (By licensed PT),OT (By licensed OT)     PT Frequency: 3x per week OT Frequency: 2x per week            Contractures Contractures Info: Not present    Additional Factors Info  Code Status,Allergies Code Status Info: DNR Allergies Info: Levofloxacin,Gabapentin           Current Medications (12/22/2020):  This is the current hospital active medication list Current Facility-Administered Medications  Medication Dose Route Frequency Provider Last Rate Last Admin  . acetaminophen (TYLENOL) tablet 1,000 mg  1,000 mg Oral Q8H Dellinger, Marianne L, PA-C   1,000 mg at 12/21/20 2055  . acetaminophen (TYLENOL) tablet 650 mg  650 mg Oral Q6H PRN Adefeso, Oladapo, DO   650 mg at 12/22/20 0333  . cefdinir (OMNICEF) capsule 300 mg  300 mg Oral Q12H Barb Merino, MD   300 mg at 12/22/20 0859  . divalproex (DEPAKOTE SPRINKLE) capsule 125 mg  125 mg Oral Q12H Guilford Shi, MD   125 mg at 12/22/20 0856  . feeding supplement (BOOST / RESOURCE BREEZE) liquid 1 Container  1 Container Oral TID BM Dellinger, Marianne L, PA-C   1 Container at 12/22/20 0859  . ferrous sulfate tablet 325 mg  325 mg Oral Daily Dellinger, Marianne L, PA-C   325 mg at  12/21/20 0943  . hydrALAZINE (APRESOLINE) injection 10 mg  10 mg Intravenous Q6H PRN Kamineni, Neelima, MD      . lidocaine (LIDODERM) 5 % 1 patch  1 patch Transdermal Q24H Guilford Shi, MD   1 patch at 12/21/20 1105  . LORazepam (ATIVAN) tablet 0.25 mg  0.25 mg Oral Q12H PRN Dellinger, Marianne L, PA-C   0.25 mg at 12/21/20 2056  . melatonin tablet 5 mg  5 mg Oral QHS Guilford Shi, MD   5 mg at 12/21/20 2056  . metoprolol tartrate (LOPRESSOR) injection 2.5 mg  2.5 mg Intravenous Q8H PRN Guilford Shi, MD      . morphine 2 MG/ML injection 1 mg  1 mg Intravenous Q4H PRN  Guilford Shi, MD   1 mg at 12/20/20 2231  . OLANZapine zydis (ZYPREXA) disintegrating tablet 2.5 mg  2.5 mg Oral BID BM & HS PRN Guilford Shi, MD      . oxyCODONE (Oxy IR/ROXICODONE) immediate release tablet 2.5 mg  2.5 mg Oral Q8H PRN Barb Merino, MD   2.5 mg at 12/22/20 0913  . sertraline (ZOLOFT) 20 MG/ML concentrated solution 60 mg  60 mg Oral q morning Dellinger, Marianne L, PA-C   60 mg at 12/22/20 0859  . traZODone (DESYREL) tablet 50 mg  50 mg Oral QHS Guilford Shi, MD   50 mg at 12/21/20 2056     Discharge Medications: TAKE these medications   acetaminophen 325 MG tablet Commonly known as: TYLENOL Take 650 mg by mouth every 8 (eight) hours as needed (pain).   cefdinir 300 MG capsule Commonly known as: OMNICEF Take 1 capsule (300 mg total) by mouth every 12 (twelve) hours for 5 days.   ferrous sulfate 325 (65 FE) MG tablet Take 325 mg by mouth every morning.   hydrochlorothiazide 12.5 MG tablet Commonly known as: HYDRODIURIL Take 12.5 mg by mouth every morning.   lidocaine 5 % Commonly known as: LIDODERM Place 1 patch onto the skin daily. Remove & Discard patch within 12 hours or as directed by MD   lisinopril 2.5 MG tablet Commonly known as: ZESTRIL Take 1 tablet (2.5 mg total) by mouth daily.   LORazepam 0.5 MG tablet Commonly known as: ATIVAN Take 0.25 mg by mouth every 12 (twelve) hours as needed for anxiety (agitation).   melatonin 5 MG Tabs Take 5 mg by mouth at bedtime.   oxyCODONE 5 MG immediate release tablet Commonly known as: Oxy IR/ROXICODONE Take 0.5 tablets (2.5 mg total) by mouth every 8 (eight) hours as needed for up to 5 days for severe pain or breakthrough pain.   sertraline 20 MG/ML concentrated solution Commonly known as: ZOLOFT Take 60 mg by mouth every morning.     Relevant Imaging Results:  Relevant Lab Results:   Additional Information SSN [id number]  Vinie Sill, LCSW

## 2020-12-22 NOTE — Discharge Summary (Signed)
Physician Discharge Summary  Kelly Freeman ZOX:[id number] DOB: [birth year] DOA: 12/17/2020  PCP: Sharion Balloon, FNP  Admit date: 12/17/2020 Discharge date: 12/22/2020  Admitted From: Assisted living facility Disposition: Assisted living facility with home health PT OT  Recommendations for Outpatient Follow-up:  1. Follow up with PCP in 1-2 weeks after discharge. 2. Please obtain BMP/CBC in one week 3. All-time fall precautions.  No anticoagulation.  Home Health: PT/OT Equipment/Devices: Not needed  Discharge Condition: Fair CODE STATUS: DNR Diet recommendation: Regular diet, aspiration precautions  Discharge summary:  87 y.o.femalewith medical history significant fordementia, hypertension, recurrent UTI, CKD stage IIIb, depression and history of DVT on Xarelto who presents to the [facility] ED via EMS from ALF due to back pain after a mechanical fall. patient fell on buttocks while trying to position her into a chair, patient ambulates with a walker at baseline.  No reported head injury.  In the emergency room, patient was afebrile.  AKI on CKD stage IIIa.  CT scan of the abdomen pelvis showed large soft tissue hematoma in the subcutaneous fat over the low back area, no other skeletal injury.  Also found to have UTI.  Anticoagulation reversal with Kcentra and admitted to the [facility] due to anticipated advance care and surgery.  Diagnosis, plan of care on discharge:  1.  Mechanical fall, soft tissue hematoma on low back: CT abd/pelvis with large hematoma in subcutaneous fat as above.  Hemoglobin somewhat downtrending from baseline 9-10-8.5-8-7.4 . No evidence of ongoing bleeding.  Pain management will include Tylenol, Lidoderm patch, small dose of oxycodone 2.5 mg as needed for next few days.  Most of her pain has been controlled.  Patient and family also against using much of the pain medications.  Today she is 5 days from trauma and continue to de-escalate any opiates.  Short-term  opiate prescriptions provided.  2.  Delirium: Likely multifactorial with underlying dementia, back pain, UTI, hospitalization with unfamiliar surroundings.  CT head negative.  Patient does have history of dementia.  She lives in assisted living facility with memory care.  Currently her behavior is well controlled.  Avoid excessive use of opiates and benzodiazepines. She will go back on her home medication regimen and controlled environment.  She has very good support system from her family and she is mostly without any delirium or agitation when her daughters and granddaughter are around.  3.  UTI: Present on admission.  Recurrent UTI.  On ceftriaxone.  Urine culture with Klebsiella sensitive to ceftriaxone.  Continue while in the hospital, will treat with 7 days of therapy.  Change to oral Omnicef today.  4.  Acute blood loss anemia on chronic anemia: No evidence of further bleeding.  Hemoglobin 7.4.  Was planning to give her 1 dose of IV iron before discharge, however patient and family decided not to get another IV line.  This is reasonable.  She is on oral iron that she will continue.  We will request to recheck CBC in 1 week to ensure stabilization.  5.  History of DVT: According to granddaughter patient had a fall around 6 months back and was hospitalized at which time she was diagnosed with a blood clot in her leg.  Xarelto now held in concern for problem #1.  Discussed with family regarding care goals and risk of worsening bleeding with resumption of anticoagulation versus recurrent DVT without.  Extremity duplex is negative for presence of DVT. Side effects more than benefits of anticoagulation, discontinue all anticoagulation.  6.  CKD stage IIIa: At about baseline.  7. Hypertension: Will resume low-dose antihypertensives from home.  8. Dementia/depression: Resume home meds.     Goal of care: Followed by palliative.  Trying to manage symptoms with different medications and pain  medications.  Patient and family willing her to go back to assisted living facility so they can work with her, regulate her medications.  Patient has poor appetite and ultimately would benefit with palliative consultation at the assisted living facility.  Family does not desire any further palliative discussions here.   DNR.   Patient does not eat well, we will liberalize her intake.  She can eat regular diet with aspiration precautions.   Discharge Diagnoses:  Principal Problem:   Hematoma, nontraumatic, soft tissue Active Problems:   Hypertension   History of recurrent UTIs   Fall at home, initial encounter   History of DVT (deep vein thrombosis)   Pain   Palliative care by specialist   Goals of care, counseling/discussion    Discharge Instructions  Discharge Instructions    Diet general   Complete by: As directed    Increase activity slowly   Complete by: As directed    No wound care   Complete by: As directed      Allergies as of 12/22/2020      Reactions   Levofloxacin Rash   Gabapentin Other (See Comments)   Other reaction(s): Confusion      Medication List    STOP taking these medications   rivaroxaban 20 MG Tabs tablet Commonly known as: XARELTO   traZODone 50 MG tablet Commonly known as: DESYREL     TAKE these medications   acetaminophen 325 MG tablet Commonly known as: TYLENOL Take 650 mg by mouth every 8 (eight) hours as needed (pain).   cefdinir 300 MG capsule Commonly known as: OMNICEF Take 1 capsule (300 mg total) by mouth every 12 (twelve) hours for 5 days.   ferrous sulfate 325 (65 FE) MG tablet Take 325 mg by mouth every morning.   hydrochlorothiazide 12.5 MG tablet Commonly known as: HYDRODIURIL Take 12.5 mg by mouth every morning.   lidocaine 5 % Commonly known as: LIDODERM Place 1 patch onto the skin daily. Remove & Discard patch within 12 hours or as directed by MD   lisinopril 2.5 MG tablet Commonly known as: ZESTRIL Take 1  tablet (2.5 mg total) by mouth daily.   LORazepam 0.5 MG tablet Commonly known as: ATIVAN Take 0.25 mg by mouth every 12 (twelve) hours as needed for anxiety (agitation).   melatonin 5 MG Tabs Take 5 mg by mouth at bedtime.   oxyCODONE 5 MG immediate release tablet Commonly known as: Oxy IR/ROXICODONE Take 0.5 tablets (2.5 mg total) by mouth every 8 (eight) hours as needed for up to 5 days for severe pain or breakthrough pain.   sertraline 20 MG/ML concentrated solution Commonly known as: ZOLOFT Take 60 mg by mouth every morning.       Follow-up Information    Sharion Balloon, FNP Follow up in 2 week(s).   Specialty: Family Medicine Contact information: [place] [address] [phone]              Allergies  Allergen Reactions  . Levofloxacin Rash  . Gabapentin Other (See Comments)    Other reaction(s): Confusion    Consultations:  Palliative care   Procedures/Studies: CT HEAD WO CONTRAST  Result Date: 12/18/2020 CLINICAL DATA:  Delirium.  Fall.  EXAM: CT HEAD WITHOUT CONTRAST TECHNIQUE: Contiguous axial images were obtained from the base of the skull through the vertex without intravenous contrast. COMPARISON:  05/13/2020 FINDINGS: Brain: There is no evidence of an acute infarct, intracranial hemorrhage, mass, midline shift, or extra-axial fluid collection. Patchy and confluent hypodensities in the cerebral white matter bilaterally are stable to mildly progressive compared to the prior CT and are nonspecific but compatible with extensive chronic small vessel ischemic disease. There is mild-to-moderate cerebral atrophy. Vascular: Calcified atherosclerosis at the skull base. Skull: No acute fracture or suspicious osseous lesion. Sinuses/Orbits: Mild bilateral ethmoid air cell mucosal thickening. Clear mastoid air cells. Bilateral cataract extraction. Other: None. IMPRESSION: 1. No evidence of acute intracranial abnormality. 2. Extensive chronic  small vessel ischemic disease. Electronically Signed   By: Sebastian Ache M.D.   On: 12/18/2020 19:15   CT Abdomen Pelvis W Contrast  Result Date: 12/18/2020 CLINICAL DATA:  Diffuse back pain and flank swelling after a fall. EXAM: CT ABDOMEN AND PELVIS WITH CONTRAST TECHNIQUE: Multidetector CT imaging of the abdomen and pelvis was performed using the standard protocol following bolus administration of intravenous contrast. CONTRAST:  51mL OMNIPAQUE IOHEXOL 300 MG/ML  SOLN COMPARISON:  None. FINDINGS: Lower chest: Motion artifact limits evaluation. Mild dependent changes in the lung bases. Cardiac enlargement. Coronary artery and aortic calcification. Hepatobiliary: Multiple circumscribed low-attenuation lesions throughout the liver are likely cysts. Largest measures about 1.8 cm in diameter. Surgical absence of the gallbladder. Mild bile duct dilatation is likely physiologic post cholecystectomy. Pancreas: Unremarkable. No pancreatic ductal dilatation or surrounding inflammatory changes. Spleen: Normal in size without focal abnormality. Adrenals/Urinary Tract: Adrenal glands are unremarkable. Kidneys are normal, without renal calculi, focal solid lesion, or hydronephrosis. Bladder is unremarkable. Bilateral renal cysts. Stomach/Bowel: Stomach is within normal limits. Appendix appears normal. No evidence of bowel wall thickening, distention, or inflammatory changes. Vascular/Lymphatic: Prominent aortic and iliac vascular calcifications. Right iliac artery is dilated, measuring 1.4 cm diameter. No significant lymphadenopathy in the abdomen. Reproductive: Uterus and ovaries are not enlarged. Other: No free air or free fluid in the abdomen. Abdominal wall musculature appears intact. Stents of area of increased density in the subcutaneous fat over the low back, extending from right to left, and measuring about 8.6 x 32.6 x 3.9 cm consistent with soft tissue hematoma. Areas of increased density within the hematoma may  represent sentinel clot or focal areas of active extravasation. Musculoskeletal: Degenerative changes in the spine and hips. Anterior compression of T11 appears chronic. No acute displaced fractures are identified. IMPRESSION: 1. Large soft tissue hematoma in the subcutaneous fat over the low back. Areas of increased density within the hematoma may represent sentinel clot or focal areas of active extravasation. 2. No evidence of bowel obstruction or inflammation. 3. Multiple hepatic and renal cysts. 4. Prominent aortic and iliac vascular calcifications. 5. Anterior compression of T11 appears chronic. No acute displaced fractures are identified. Aortic Atherosclerosis (ICD10-I70.0). Critical Value/emergent results were called by telephone at the time of interpretation on 12/18/2020 at 12:16 am to provider Dr. Manus Gunning, Who verbally acknowledged these results. Electronically Signed   By: Burman Nieves M.D.   On: 12/18/2020 00:18   DG HIP UNILAT WITH PELVIS 2-3 VIEWS LEFT  Result Date: 12/20/2020 CLINICAL DATA:  Left hip pain after fall 2 days ago. EXAM: DG HIP (WITH OR WITHOUT PELVIS) 2-3V LEFT COMPARISON:  None. FINDINGS: There is no evidence of hip fracture or dislocation. Moderate narrowing and osteophyte formation is seen involving both hips. IMPRESSION:  Moderate degenerative joint disease is noted bilaterally. No acute abnormality is noted. Electronically Signed   By: Marijo Conception M.D.   On: 12/20/2020 13:31   VAS [place] LOWER EXTREMITY VENOUS (DVT)  Result Date: 12/21/2020  Lower Venous DVT Study Patient Name:  Kelly Freeman  Date of Exam:   12/20/2020 Medical Rec #: [id number]      Accession #:    [id number] Date of Birth: Aug 31, 1926       Patient Gender: F Patient Age:   89Y Exam Location:  [facility] Procedure:      VAS [place] LOWER EXTREMITY VENOUS (DVT) Referring Phys: [id number] [place] --------------------------------------------------------------------------------  Indications: History of  DVT, s/p fall, holding xarelto secondary to fall and resultant injury.  Limitations: Patient condition- pain, agitation, inability to cooperate with repositioning. Comparison Study: No prior studies. Performing Technologist: Darlin Coco RDMS,RVT  Examination Guidelines: A complete evaluation includes B-mode imaging, spectral Doppler, color Doppler, and power Doppler as needed of all accessible portions of each vessel. Bilateral testing is considered an integral part of a complete examination. Limited examinations for reoccurring indications may be performed as noted. The reflux portion of the exam is performed with the patient in reverse Trendelenburg.  +---------+---------------+---------+-----------+----------+-------------------+ RIGHT    CompressibilityPhasicitySpontaneityPropertiesThrombus Aging      +---------+---------------+---------+-----------+----------+-------------------+ CFV      Full           Yes      Yes                                      +---------+---------------+---------+-----------+----------+-------------------+ SFJ      Full                                                             +---------+---------------+---------+-----------+----------+-------------------+ FV Prox  Full                                                             +---------+---------------+---------+-----------+----------+-------------------+ FV Mid   Full                                                             +---------+---------------+---------+-----------+----------+-------------------+ FV DistalFull                                                             +---------+---------------+---------+-----------+----------+-------------------+ PFV      Full                                                             +---------+---------------+---------+-----------+----------+-------------------+  POP      Full           Yes      Yes                  Some  segments not                                                         well visualized     +---------+---------------+---------+-----------+----------+-------------------+ PTV      Full                                                             +---------+---------------+---------+-----------+----------+-------------------+ PERO     Full                                                             +---------+---------------+---------+-----------+----------+-------------------+   +---------+---------------+---------+-----------+----------+-------------------+ LEFT     CompressibilityPhasicitySpontaneityPropertiesThrombus Aging      +---------+---------------+---------+-----------+----------+-------------------+ CFV      Full           Yes      Yes                                      +---------+---------------+---------+-----------+----------+-------------------+ SFJ      Full                                                             +---------+---------------+---------+-----------+----------+-------------------+ FV Prox  Full                                                             +---------+---------------+---------+-----------+----------+-------------------+ FV Mid   Full                                                             +---------+---------------+---------+-----------+----------+-------------------+ FV DistalFull                                                             +---------+---------------+---------+-----------+----------+-------------------+ PFV      Full                                                             +---------+---------------+---------+-----------+----------+-------------------+  POP      Full           Yes      Yes                  Some segments not                                                         well visualized.     +---------+---------------+---------+-----------+----------+-------------------+ PTV      Full                                         Some segments not                                                         well visualized     +---------+---------------+---------+-----------+----------+-------------------+ PERO     Full                                         Some segments not                                                         well visualized     +---------+---------------+---------+-----------+----------+-------------------+     Summary: RIGHT: - There is no evidence of deep vein thrombosis in the lower extremity. However, portions of this examination were limited- see technologist comments above.  - No cystic structure found in the popliteal fossa.  LEFT: - There is no evidence of deep vein thrombosis in the lower extremity. However, portions of this examination were limited- see technologist comments above.  - No cystic structure found in the popliteal fossa.  *See table(s) above for measurements and observations. Electronically signed by Deitra Mayo MD on 12/21/2020 at 7:31:55 AM.    Final     (Echo, Carotid, EGD, Colonoscopy, ERCP)    Subjective: Patient seen and examined.  No overnight events.  Pleasant and confused.  No evidence of agitation or delirium overnight.   Discharge Exam: Vitals:   12/22/20 0315 12/22/20 0801  BP:  (!) 174/71  Pulse: 77 69  Resp: 16 18  Temp: (!) 96.9 F (36.1 C) 97.6 F (36.4 C)  SpO2: 96% 97%   Vitals:   12/21/20 1925 12/21/20 2307 12/22/20 0315 12/22/20 0801  BP:  (!) 139/55  (!) 174/71  Pulse: 77 76 77 69  Resp: 17 14 16 18   Temp: 98.9 F (37.2 C) 98.5 F (36.9 C) (!) 96.9 F (36.1 C) 97.6 F (36.4 C)  TempSrc: Axillary Axillary Oral Oral  SpO2: 97% 98% 96% 97%  Weight:      Height:        General: Pt is alert, awake, not in acute distress Frail and debilitated.  Looks comfortable at room  air. Patient is pleasantly confused, alert oriented x1.  Quiet and composed. Cardiovascular: RRR, S1/S2 +, no rubs, no gallops Respiratory: CTA bilaterally, no wheezing, no rhonchi Abdominal: Soft, NT, ND, bowel sounds + Extremities: no edema, no cyanosis Left lower back, ecchymosis, no palpable hematoma or swelling.  No palpable tenderness.    The results of significant diagnostics from this hospitalization (including imaging, microbiology, ancillary and laboratory) are listed below for reference.     Microbiology: Recent Results (from the past 240 hour(s))  Resp Panel by RT-PCR (Flu A&B, Covid) Nasopharyngeal Swab     Status: None   Collection Time: 12/18/20  1:05 AM   Specimen: Nasopharyngeal Swab; Nasopharyngeal(NP) swabs in vial transport medium  Result Value Ref Range Status   SARS Coronavirus 2 by RT PCR NEGATIVE NEGATIVE Final    Comment: (NOTE) SARS-CoV-2 target nucleic acids are NOT DETECTED.  The SARS-CoV-2 RNA is generally detectable in upper respiratory specimens during the acute phase of infection. The lowest concentration of SARS-CoV-2 viral copies this assay can detect is 138 copies/mL. A negative result does not preclude SARS-Cov-2 infection and should not be used as the sole basis for treatment or other patient management decisions. A negative result may occur with  improper specimen collection/handling, submission of specimen other than nasopharyngeal swab, presence of viral mutation(s) within the areas targeted by this assay, and inadequate number of viral copies(<138 copies/mL). A negative result must be combined with clinical observations, patient history, and epidemiological information. The expected result is Negative.  Fact Sheet for Patients:  EntrepreneurPulse.com.au  Fact Sheet for Healthcare Providers:  IncredibleEmployment.be  This test is no t yet approved or cleared by the [place] FDA and  has been  authorized for detection and/or diagnosis of SARS-CoV-2 by FDA under an Emergency Use Authorization (EUA). This EUA will remain  in effect (meaning this test can be used) for the duration of the COVID-19 declaration under Section 564(b)(1) of the Act, 21 U.S.C.section 360bbb-3(b)(1), unless the authorization is terminated  or revoked sooner.       Influenza A by PCR NEGATIVE NEGATIVE Final   Influenza B by PCR NEGATIVE NEGATIVE Final    Comment: (NOTE) The Xpert Xpress SARS-CoV-2/FLU/RSV plus assay is intended as an aid in the diagnosis of influenza from Nasopharyngeal swab specimens and should not be used as a sole basis for treatment. Nasal washings and aspirates are unacceptable for Xpert Xpress SARS-CoV-2/FLU/RSV testing.  Fact Sheet for Patients: EntrepreneurPulse.com.au  Fact Sheet for Healthcare Providers: IncredibleEmployment.be  This test is not yet approved or cleared by the [place] FDA and has been authorized for detection and/or diagnosis of SARS-CoV-2 by FDA under an Emergency Use Authorization (EUA). This EUA will remain in effect (meaning this test can be used) for the duration of the COVID-19 declaration under Section 564(b)(1) of the Act, 21 U.S.C. section 360bbb-3(b)(1), unless the authorization is terminated or revoked.  Performed at [facility], [address], [place], [place] [address]   Urine Culture     Status: Abnormal   Collection Time: 12/18/20  3:01 AM   Specimen: Urine, Clean Catch  Result Value Ref Range Status   Specimen Description   Final    URINE, CLEAN CATCH Performed at [facility], [address], [place], [place] [address]    Special Requests   Final    NONE Performed at [facility], [address] [place],  [address]    Culture 80,000 COLONIES/mL KLEBSIELLA PNEUMONIAE (A)  Final   Report  Status 12/20/2020 FINAL  Final   Organism ID, Bacteria KLEBSIELLA PNEUMONIAE (A)  Final       Susceptibility   Klebsiella pneumoniae - MIC*    AMPICILLIN RESISTANT Resistant     CEFAZOLIN <=4 SENSITIVE Sensitive     CEFEPIME <=0.12 SENSITIVE Sensitive     CEFTRIAXONE <=0.25 SENSITIVE Sensitive     CIPROFLOXACIN <=0.25 SENSITIVE Sensitive     GENTAMICIN <=1 SENSITIVE Sensitive     IMIPENEM <=0.25 SENSITIVE Sensitive     NITROFURANTOIN 128 RESISTANT Resistant     TRIMETH/SULFA >=320 RESISTANT Resistant     AMPICILLIN/SULBACTAM <=2 SENSITIVE Sensitive     PIP/TAZO <=4 SENSITIVE Sensitive     * 80,000 COLONIES/mL KLEBSIELLA PNEUMONIAE     Labs: BNP (last 3 results) No results for input(s): BNP in the last 8760 hours. Basic Metabolic Panel: Recent Labs  Lab 12/17/20 2200 12/18/20 0858 12/19/20 1002 12/20/20 0759 12/21/20 0124  NA 136 137 139 136 138  K 4.2 4.4 4.0 3.9 4.3  CL 101 104 106 105 102  CO2 28 24 25 25 27   GLUCOSE 111* 112* 133* 126* 103*  BUN 54* 45* 35* 27* 30*  CREATININE 1.65* 1.52* 1.25* 1.24* 1.32*  CALCIUM 8.9 8.8* 9.2 8.9 9.1   Liver Function Tests: No results for input(s): AST, ALT, ALKPHOS, BILITOT, PROT, ALBUMIN in the last 168 hours. No results for input(s): LIPASE, AMYLASE in the last 168 hours. No results for input(s): AMMONIA in the last 168 hours. CBC: Recent Labs  Lab 12/17/20 2200 12/18/20 0129 12/18/20 0858 12/18/20 1727 12/19/20 1002 12/20/20 0759 12/21/20 0124  WBC 8.7  --  11.3*  --  10.2 10.7* 10.6*  NEUTROABS 6.0  --   --   --   --   --   --   HGB 11.1*   < > 9.3* 8.5* 8.1* 8.0* 7.4*  HCT 35.4*   < > 29.5* 26.2* 25.8* 24.9* 23.3*  MCV 94.7  --  93.7  --  93.8 92.2 93.2  PLT 161  --  158  --  141* 151 160   < > = values in this interval not displayed.   Cardiac Enzymes: No results for input(s): CKTOTAL, CKMB, CKMBINDEX, TROPONINI in the last 168 hours. BNP: Invalid input(s): POCBNP CBG: Recent Labs  Lab 12/17/20 2114 12/18/20 1626  GLUCAP 116* 131*   D-Dimer No results for input(s): DDIMER in the last 72  hours. Hgb A1c No results for input(s): HGBA1C in the last 72 hours. Lipid Profile No results for input(s): CHOL, HDL, LDLCALC, TRIG, CHOLHDL, LDLDIRECT in the last 72 hours. Thyroid function studies No results for input(s): TSH, T4TOTAL, T3FREE, THYROIDAB in the last 72 hours.  Invalid input(s): FREET3 Anemia work up No results for input(s): VITAMINB12, FOLATE, FERRITIN, TIBC, IRON, RETICCTPCT in the last 72 hours. Urinalysis    Component Value Date/Time   COLORURINE YELLOW 12/17/2020 2243   APPEARANCEUR HAZY (A) 12/17/2020 2243   LABSPEC 1.015 12/17/2020 2243   PHURINE 5.0 12/17/2020 2243   GLUCOSEU NEGATIVE 12/17/2020 2243   HGBUR NEGATIVE 12/17/2020 2243   BILIRUBINUR NEGATIVE 12/17/2020 2243   KETONESUR NEGATIVE 12/17/2020 2243   PROTEINUR NEGATIVE 12/17/2020 2243   NITRITE NEGATIVE 12/17/2020 2243   LEUKOCYTESUR SMALL (A) 12/17/2020 2243   Sepsis Labs Invalid input(s): PROCALCITONIN,  WBC,  LACTICIDVEN Microbiology Recent Results (from the past 240 hour(s))  Resp Panel by RT-PCR (Flu A&B, Covid) Nasopharyngeal Swab     Status: None   Collection Time: 12/18/20  1:05 AM   Specimen: Nasopharyngeal Swab; Nasopharyngeal(NP) swabs in vial transport medium  Result Value Ref Range Status   SARS Coronavirus 2 by RT PCR NEGATIVE NEGATIVE Final    Comment: (NOTE) SARS-CoV-2 target nucleic acids are NOT DETECTED.  The SARS-CoV-2 RNA is generally detectable in upper respiratory specimens during the acute phase of infection. The lowest concentration of SARS-CoV-2 viral copies this assay can detect is 138 copies/mL. A negative result does not preclude SARS-Cov-2 infection and should not be used as the sole basis for treatment or other patient management decisions. A negative result may occur with  improper specimen collection/handling, submission of specimen other than nasopharyngeal swab, presence of viral mutation(s) within the areas targeted by this assay, and inadequate  number of viral copies(<138 copies/mL). A negative result must be combined with clinical observations, patient history, and epidemiological information. The expected result is Negative.  Fact Sheet for Patients:  BloggerCourse.comhttps://www.fda.gov/media/152166/download  Fact Sheet for Healthcare Providers:  SeriousBroker.ithttps://www.fda.gov/media/152162/download  This test is no t yet approved or cleared by the [place] FDA and  has been authorized for detection and/or diagnosis of SARS-CoV-2 by FDA under an Emergency Use Authorization (EUA). This EUA will remain  in effect (meaning this test can be used) for the duration of the COVID-19 declaration under Section 564(b)(1) of the Act, 21 U.S.C.section 360bbb-3(b)(1), unless the authorization is terminated  or revoked sooner.       Influenza A by PCR NEGATIVE NEGATIVE Final   Influenza B by PCR NEGATIVE NEGATIVE Final    Comment: (NOTE) The Xpert Xpress SARS-CoV-2/FLU/RSV plus assay is intended as an aid in the diagnosis of influenza from Nasopharyngeal swab specimens and should not be used as a sole basis for treatment. Nasal washings and aspirates are unacceptable for Xpert Xpress SARS-CoV-2/FLU/RSV testing.  Fact Sheet for Patients: BloggerCourse.comhttps://www.fda.gov/media/152166/download  Fact Sheet for Healthcare Providers: SeriousBroker.ithttps://www.fda.gov/media/152162/download  This test is not yet approved or cleared by the [place] FDA and has been authorized for detection and/or diagnosis of SARS-CoV-2 by FDA under an Emergency Use Authorization (EUA). This EUA will remain in effect (meaning this test can be used) for the duration of the COVID-19 declaration under Section 564(b)(1) of the Act, 21 U.S.C. section 360bbb-3(b)(1), unless the authorization is terminated or revoked.  Performed at [facility], [address] [place], [place] [address]   Urine Culture     Status: Abnormal   Collection Time: 12/18/20  3:01 AM   Specimen: Urine, Clean Catch  Result  Value Ref Range Status   Specimen Description   Final    URINE, CLEAN CATCH Performed at [facility], [address] [place], [place] [address]    Special Requests   Final    NONE Performed at [facility], [address] [place], [place] [address]    Culture 80,000 COLONIES/mL KLEBSIELLA PNEUMONIAE (A)  Final   Report Status 12/20/2020 FINAL  Final   Organism ID, Bacteria KLEBSIELLA PNEUMONIAE (A)  Final      Susceptibility   Klebsiella pneumoniae - MIC*    AMPICILLIN RESISTANT Resistant     CEFAZOLIN <=4 SENSITIVE Sensitive     CEFEPIME <=0.12 SENSITIVE Sensitive     CEFTRIAXONE <=0.25 SENSITIVE Sensitive     CIPROFLOXACIN <=0.25 SENSITIVE Sensitive     GENTAMICIN <=1 SENSITIVE Sensitive     IMIPENEM <=0.25 SENSITIVE Sensitive     NITROFURANTOIN 128 RESISTANT Resistant     TRIMETH/SULFA >=320 RESISTANT Resistant     AMPICILLIN/SULBACTAM <=2 SENSITIVE Sensitive  PIP/TAZO <=4 SENSITIVE Sensitive     * 80,000 COLONIES/mL KLEBSIELLA PNEUMONIAE     Time coordinating discharge:  35 minutes  SIGNED:   Barb Merino, MD  [facility] 12/22/2020, 8:21 AM

## 2020-12-22 NOTE — Plan of Care (Signed)

## 2020-12-22 NOTE — TOC Transition Note (Signed)
Transition of Care [facility]) - CM/SW Discharge Note   Patient Details  Name: Kelly Freeman MRN: [id number] Date of Birth: [birth year]  Transition of Care [facility]) CM/SW Contact:  Vinie Sill, LCSW Phone Number: 12/22/2020, 12:10 PM   Clinical Narrative:     Patient will Discharge to: [facility]F Discharge Date: 12/22/2020 Family Notified: [name], daughter Transport WS:FKCL  Per MD patient is ready for discharge. RN, patient, and facility notified of discharge. Discharge Summary sent to facility. RN given number for [phone]. Ambulance transport requested for patient.   Clinical Social Worker signing off.  Thurmond Butts, MSW, LCSW Clinical Social Worker    Final next level of care: Assisted Living Barriers to Discharge: Barriers Resolved   Patient Goals and CMS Choice        Discharge Placement              Patient chooses bed at:  ([facility]) Patient to be transferred to facility by: [facility] Name of family member notified: [name],daughter Patient and family notified of of transfer: 12/22/20  Discharge Plan and Services In-house Referral: Clinical Social Work                                   Social Determinants of Health (SDOH) Interventions     Readmission Risk Interventions No flowsheet data found.

## 2020-12-22 NOTE — Progress Notes (Signed)
Patient discharged back to ALF via transport.  No changes noted in assessment.  All belongings with patient.  Report called to Isaiah Blakes, Administrator, at ALF.

## 2020-12-22 NOTE — NC FL2 (Addendum)
[place] LEVEL OF CARE SCREENING TOOL     IDENTIFICATION  Patient Name: Kelly Freeman Birthdate: [birth year] Sex: female Admission Date (Current Location): 12/17/2020  [facility] and [place] Number:  [employer] and Address:  [facility] [place], [place] [address], [place], [place] [address]      Provider Number: [id number]  Attending Physician Name and Address:  Barb Merino, MD  Relative Name and Phone Number:       Current Level of Care: Hospital Recommended Level of Care: Assisted Living [facility] Care Prior Approval Number:    Date Approved/Denied:   PASRR Number:    Discharge Plan: Other (Comment) (ALF)-Memory    Current Diagnoses: Patient Active Problem List   Diagnosis Date Noted  . Pain   . Palliative care by specialist   . Goals of care, counseling/discussion   . Hematoma, nontraumatic, soft tissue 12/18/2020  . Fall at home, initial encounter 12/18/2020  . History of DVT (deep vein thrombosis) 12/18/2020  . Type 2 diabetes mellitus with kidney complication, with long-term current use of insulin ([place])   . Recurrent falls   . Moderate episode of recurrent major depressive disorder ([place])   . MGUS (monoclonal gammopathy of unknown significance)   . Hypertension   . History of recurrent UTIs   . CKD (chronic kidney disease) stage 3, GFR 30-59 ml/min (HCC)     Orientation RESPIRATION BLADDER Height & Weight     Self  Normal Incontinent Weight: 147 lb 0.8 oz (66.7 kg) Height:  5\' 7"  (170.2 cm)  BEHAVIORAL SYMPTOMS/MOOD NEUROLOGICAL BOWEL NUTRITION STATUS      Incontinent  (regular diet)  AMBULATORY STATUS COMMUNICATION OF NEEDS Skin   Limited Assist Verbally Surgical wounds (wound/incision, non pressure vetebral column lower)                       Personal Care Assistance Level of Assistance  Bathing,Feeding,Dressing Bathing Assistance: Limited assistance Feeding assistance: Independent Dressing  Assistance: Limited assistance     Functional Limitations Info  Sight,Hearing,Speech Sight Info: Adequate Hearing Info: Impaired Speech Info: Adequate    SPECIAL CARE FACTORS FREQUENCY  PT (By licensed PT),OT (By licensed OT)     PT Frequency: 3x per week OT Frequency: 2x per week            Contractures Contractures Info: Not present    Additional Factors Info  Code Status,Allergies Code Status Info: DNR Allergies Info: Levofloxacin,Gabapentin           Current Medications (12/22/2020):  This is the current hospital active medication list Current Facility-Administered Medications  Medication Dose Route Frequency Provider Last Rate Last Admin  . acetaminophen (TYLENOL) tablet 1,000 mg  1,000 mg Oral Q8H Dellinger, Marianne L, PA-C   1,000 mg at 12/21/20 2055  . acetaminophen (TYLENOL) tablet 650 mg  650 mg Oral Q6H PRN Adefeso, Oladapo, DO   650 mg at 12/22/20 0333  . cefdinir (OMNICEF) capsule 300 mg  300 mg Oral Q12H Barb Merino, MD   300 mg at 12/22/20 0859  . divalproex (DEPAKOTE SPRINKLE) capsule 125 mg  125 mg Oral Q12H Guilford Shi, MD   125 mg at 12/22/20 0856  . feeding supplement (BOOST / RESOURCE BREEZE) liquid 1 Container  1 Container Oral TID BM Dellinger, Marianne L, PA-C   1 Container at 12/22/20 0859  . ferrous sulfate tablet 325 mg  325 mg Oral Daily Dellinger, Marianne L, PA-C   325 mg  at 12/21/20 0943  . hydrALAZINE (APRESOLINE) injection 10 mg  10 mg Intravenous Q6H PRN Kamineni, Neelima, MD      . lidocaine (LIDODERM) 5 % 1 patch  1 patch Transdermal Q24H Guilford Shi, MD   1 patch at 12/21/20 1105  . LORazepam (ATIVAN) tablet 0.25 mg  0.25 mg Oral Q12H PRN Dellinger, Marianne L, PA-C   0.25 mg at 12/21/20 2056  . melatonin tablet 5 mg  5 mg Oral QHS Guilford Shi, MD   5 mg at 12/21/20 2056  . metoprolol tartrate (LOPRESSOR) injection 2.5 mg  2.5 mg Intravenous Q8H PRN Guilford Shi, MD      . morphine 2 MG/ML injection 1 mg  1 mg  Intravenous Q4H PRN Guilford Shi, MD   1 mg at 12/20/20 2231  . OLANZapine zydis (ZYPREXA) disintegrating tablet 2.5 mg  2.5 mg Oral BID BM & HS PRN Guilford Shi, MD      . oxyCODONE (Oxy IR/ROXICODONE) immediate release tablet 2.5 mg  2.5 mg Oral Q8H PRN Barb Merino, MD   2.5 mg at 12/22/20 0913  . sertraline (ZOLOFT) 20 MG/ML concentrated solution 60 mg  60 mg Oral q morning Dellinger, Marianne L, PA-C   60 mg at 12/22/20 0859  . traZODone (DESYREL) tablet 50 mg  50 mg Oral QHS Guilford Shi, MD   50 mg at 12/21/20 2056     Discharge Medications: TAKE these medications   acetaminophen 325 MG tablet Commonly known as: TYLENOL Take 650 mg by mouth every 8 (eight) hours as needed (pain).   cefdinir 300 MG capsule Commonly known as: OMNICEF Take 1 capsule (300 mg total) by mouth every 12 (twelve) hours for 5 days.   ferrous sulfate 325 (65 FE) MG tablet Take 325 mg by mouth every morning.   hydrochlorothiazide 12.5 MG tablet Commonly known as: HYDRODIURIL Take 12.5 mg by mouth every morning.   lidocaine 5 % Commonly known as: LIDODERM Place 1 patch onto the skin daily. Remove & Discard patch within 12 hours or as directed by MD   lisinopril 2.5 MG tablet Commonly known as: ZESTRIL Take 1 tablet (2.5 mg total) by mouth daily.   LORazepam 0.5 MG tablet Commonly known as: ATIVAN Take 0.25 mg by mouth every 12 (twelve) hours as needed for anxiety (agitation).   melatonin 5 MG Tabs Take 5 mg by mouth at bedtime.   oxyCODONE 5 MG immediate release tablet Commonly known as: Oxy IR/ROXICODONE Take 0.5 tablets (2.5 mg total) by mouth every 8 (eight) hours as needed for up to 5 days for severe pain or breakthrough pain.   sertraline 20 MG/ML concentrated solution Commonly known as: ZOLOFT Take 60 mg by mouth every morning.      Relevant Imaging Results:  Relevant Lab Results:   Additional Information SSN [id number]  Vinie Sill,  LCSW

## 2020-12-23 ENCOUNTER — Telehealth: Payer: Self-pay

## 2020-12-23 DIAGNOSIS — W19XXXA Unspecified fall, initial encounter: Secondary | ICD-10-CM | POA: Diagnosis not present

## 2020-12-23 DIAGNOSIS — S300XXD Contusion of lower back and pelvis, subsequent encounter: Secondary | ICD-10-CM | POA: Diagnosis not present

## 2020-12-23 DIAGNOSIS — N390 Urinary tract infection, site not specified: Secondary | ICD-10-CM | POA: Diagnosis not present

## 2020-12-23 DIAGNOSIS — S50812D Abrasion of left forearm, subsequent encounter: Secondary | ICD-10-CM | POA: Diagnosis not present

## 2020-12-23 DIAGNOSIS — D649 Anemia, unspecified: Secondary | ICD-10-CM | POA: Diagnosis not present

## 2020-12-23 DIAGNOSIS — I129 Hypertensive chronic kidney disease with stage 1 through stage 4 chronic kidney disease, or unspecified chronic kidney disease: Secondary | ICD-10-CM | POA: Diagnosis not present

## 2020-12-23 DIAGNOSIS — N1831 Chronic kidney disease, stage 3a: Secondary | ICD-10-CM | POA: Diagnosis not present

## 2020-12-23 DIAGNOSIS — Z86718 Personal history of other venous thrombosis and embolism: Secondary | ICD-10-CM | POA: Diagnosis not present

## 2020-12-23 NOTE — Telephone Encounter (Signed)
Transition Care Management Follow-up Telephone Call  Date of discharge and from where: 12/22/20 from [facility]  How have you been since you were released from the hospital? Better, eating and sleeping well  Any questions or concerns? No  Items Reviewed:  Did the pt receive and understand the discharge instructions provided? Yes   Medications obtained and verified? Yes   Other? No   Any new allergies since your discharge? No   Dietary orders reviewed? Yes  Do you have support at home? Yes   Home Care and Equipment/Supplies: Were home health services ordered? yes If so, what is the name of the agency? Unsure  They are coming to her assisted living home Has the agency set up a time to come to the patient's home? yes Were any new equipment or medical supplies ordered?  No  Functional Questionnaire: (I = Independent and D = Dependent) ADLs: I  Bathing/Dressing- I  Meal Prep- I  Eating- I  Maintaining continence- I  Transferring/Ambulation- I  Managing Meds- I  Follow up appointments reviewed:   PCP Hospital f/u appt confirmed? Yes  Scheduled to see Tiffany on 01/04/21 @ 2:30.  [facility] f/u appt confirmed? No   Are transportation arrangements needed? No   If their condition worsens, is the pt aware to call PCP or go to the Emergency Dept.? Yes  Was the patient provided with contact information for the PCP's office or ED? Yes  Was to pt encouraged to call back with questions or concerns? Yes

## 2020-12-24 ENCOUNTER — Telehealth: Payer: Self-pay

## 2020-12-24 DIAGNOSIS — N390 Urinary tract infection, site not specified: Secondary | ICD-10-CM | POA: Diagnosis not present

## 2020-12-24 DIAGNOSIS — I10 Essential (primary) hypertension: Secondary | ICD-10-CM

## 2020-12-24 DIAGNOSIS — Z86718 Personal history of other venous thrombosis and embolism: Secondary | ICD-10-CM | POA: Diagnosis not present

## 2020-12-24 DIAGNOSIS — N1831 Chronic kidney disease, stage 3a: Secondary | ICD-10-CM | POA: Diagnosis not present

## 2020-12-24 DIAGNOSIS — S300XXD Contusion of lower back and pelvis, subsequent encounter: Secondary | ICD-10-CM | POA: Diagnosis not present

## 2020-12-24 DIAGNOSIS — S50812D Abrasion of left forearm, subsequent encounter: Secondary | ICD-10-CM | POA: Diagnosis not present

## 2020-12-24 DIAGNOSIS — I129 Hypertensive chronic kidney disease with stage 1 through stage 4 chronic kidney disease, or unspecified chronic kidney disease: Secondary | ICD-10-CM | POA: Diagnosis not present

## 2020-12-24 DIAGNOSIS — W19XXXA Unspecified fall, initial encounter: Secondary | ICD-10-CM | POA: Diagnosis not present

## 2020-12-24 DIAGNOSIS — D649 Anemia, unspecified: Secondary | ICD-10-CM | POA: Diagnosis not present

## 2020-12-24 NOTE — Telephone Encounter (Signed)
Kathlee Nations from [facility] is calling to request lab orders for patient.  Her family has told her she is due for labs on 01/04/21.  Also, family is requesting referral to urologist for frequent uti's.  They would like for her to go to [place].

## 2020-12-27 NOTE — Telephone Encounter (Signed)
LMOVM for Kelly Freeman for lab order for CBC & BMP to be drawn

## 2020-12-27 NOTE — Telephone Encounter (Signed)
Orders for CBC and BMP ordered for 12/29/20. Referral to urology placed.

## 2020-12-28 DIAGNOSIS — S50812D Abrasion of left forearm, subsequent encounter: Secondary | ICD-10-CM | POA: Diagnosis not present

## 2020-12-28 DIAGNOSIS — S300XXD Contusion of lower back and pelvis, subsequent encounter: Secondary | ICD-10-CM | POA: Diagnosis not present

## 2020-12-28 DIAGNOSIS — N390 Urinary tract infection, site not specified: Secondary | ICD-10-CM | POA: Diagnosis not present

## 2020-12-28 DIAGNOSIS — I129 Hypertensive chronic kidney disease with stage 1 through stage 4 chronic kidney disease, or unspecified chronic kidney disease: Secondary | ICD-10-CM | POA: Diagnosis not present

## 2020-12-28 DIAGNOSIS — N1831 Chronic kidney disease, stage 3a: Secondary | ICD-10-CM | POA: Diagnosis not present

## 2020-12-28 DIAGNOSIS — Z86718 Personal history of other venous thrombosis and embolism: Secondary | ICD-10-CM | POA: Diagnosis not present

## 2020-12-28 DIAGNOSIS — W19XXXA Unspecified fall, initial encounter: Secondary | ICD-10-CM | POA: Diagnosis not present

## 2020-12-28 DIAGNOSIS — D649 Anemia, unspecified: Secondary | ICD-10-CM | POA: Diagnosis not present

## 2020-12-29 DIAGNOSIS — Z86718 Personal history of other venous thrombosis and embolism: Secondary | ICD-10-CM | POA: Diagnosis not present

## 2020-12-29 DIAGNOSIS — S300XXD Contusion of lower back and pelvis, subsequent encounter: Secondary | ICD-10-CM | POA: Diagnosis not present

## 2020-12-29 DIAGNOSIS — S51802A Unspecified open wound of left forearm, initial encounter: Secondary | ICD-10-CM | POA: Diagnosis not present

## 2020-12-29 DIAGNOSIS — N1831 Chronic kidney disease, stage 3a: Secondary | ICD-10-CM | POA: Diagnosis not present

## 2020-12-29 DIAGNOSIS — S50812D Abrasion of left forearm, subsequent encounter: Secondary | ICD-10-CM | POA: Diagnosis not present

## 2020-12-29 DIAGNOSIS — N390 Urinary tract infection, site not specified: Secondary | ICD-10-CM | POA: Diagnosis not present

## 2020-12-29 DIAGNOSIS — I129 Hypertensive chronic kidney disease with stage 1 through stage 4 chronic kidney disease, or unspecified chronic kidney disease: Secondary | ICD-10-CM | POA: Diagnosis not present

## 2020-12-29 DIAGNOSIS — D649 Anemia, unspecified: Secondary | ICD-10-CM | POA: Diagnosis not present

## 2020-12-29 DIAGNOSIS — W19XXXA Unspecified fall, initial encounter: Secondary | ICD-10-CM | POA: Diagnosis not present

## 2020-12-31 DIAGNOSIS — D649 Anemia, unspecified: Secondary | ICD-10-CM | POA: Diagnosis not present

## 2020-12-31 DIAGNOSIS — Z86718 Personal history of other venous thrombosis and embolism: Secondary | ICD-10-CM | POA: Diagnosis not present

## 2020-12-31 DIAGNOSIS — I129 Hypertensive chronic kidney disease with stage 1 through stage 4 chronic kidney disease, or unspecified chronic kidney disease: Secondary | ICD-10-CM | POA: Diagnosis not present

## 2020-12-31 DIAGNOSIS — S300XXD Contusion of lower back and pelvis, subsequent encounter: Secondary | ICD-10-CM | POA: Diagnosis not present

## 2020-12-31 DIAGNOSIS — N1831 Chronic kidney disease, stage 3a: Secondary | ICD-10-CM | POA: Diagnosis not present

## 2020-12-31 DIAGNOSIS — N390 Urinary tract infection, site not specified: Secondary | ICD-10-CM | POA: Diagnosis not present

## 2020-12-31 DIAGNOSIS — W19XXXA Unspecified fall, initial encounter: Secondary | ICD-10-CM | POA: Diagnosis not present

## 2020-12-31 DIAGNOSIS — S50812D Abrasion of left forearm, subsequent encounter: Secondary | ICD-10-CM | POA: Diagnosis not present

## 2021-01-03 DIAGNOSIS — W19XXXA Unspecified fall, initial encounter: Secondary | ICD-10-CM | POA: Diagnosis not present

## 2021-01-03 DIAGNOSIS — N1831 Chronic kidney disease, stage 3a: Secondary | ICD-10-CM | POA: Diagnosis not present

## 2021-01-03 DIAGNOSIS — N390 Urinary tract infection, site not specified: Secondary | ICD-10-CM | POA: Diagnosis not present

## 2021-01-03 DIAGNOSIS — I129 Hypertensive chronic kidney disease with stage 1 through stage 4 chronic kidney disease, or unspecified chronic kidney disease: Secondary | ICD-10-CM | POA: Diagnosis not present

## 2021-01-03 DIAGNOSIS — S300XXD Contusion of lower back and pelvis, subsequent encounter: Secondary | ICD-10-CM | POA: Diagnosis not present

## 2021-01-03 DIAGNOSIS — S50812D Abrasion of left forearm, subsequent encounter: Secondary | ICD-10-CM | POA: Diagnosis not present

## 2021-01-03 DIAGNOSIS — Z86718 Personal history of other venous thrombosis and embolism: Secondary | ICD-10-CM | POA: Diagnosis not present

## 2021-01-03 DIAGNOSIS — D649 Anemia, unspecified: Secondary | ICD-10-CM | POA: Diagnosis not present

## 2021-01-04 ENCOUNTER — Encounter: Payer: Self-pay | Admitting: Family Medicine

## 2021-01-04 ENCOUNTER — Ambulatory Visit (INDEPENDENT_AMBULATORY_CARE_PROVIDER_SITE_OTHER): Payer: Medicare Other | Admitting: Family Medicine

## 2021-01-04 ENCOUNTER — Other Ambulatory Visit: Payer: Self-pay

## 2021-01-04 VITALS — BP 105/58 | HR 65 | Temp 98.3°F | Ht 67.0 in | Wt 142.5 lb

## 2021-01-04 DIAGNOSIS — Z86718 Personal history of other venous thrombosis and embolism: Secondary | ICD-10-CM | POA: Diagnosis not present

## 2021-01-04 DIAGNOSIS — F03918 Unspecified dementia, unspecified severity, with other behavioral disturbance: Secondary | ICD-10-CM | POA: Insufficient documentation

## 2021-01-04 DIAGNOSIS — N390 Urinary tract infection, site not specified: Secondary | ICD-10-CM | POA: Diagnosis not present

## 2021-01-04 DIAGNOSIS — I10 Essential (primary) hypertension: Secondary | ICD-10-CM

## 2021-01-04 DIAGNOSIS — Z7689 Persons encountering health services in other specified circumstances: Secondary | ICD-10-CM

## 2021-01-04 DIAGNOSIS — S300XXD Contusion of lower back and pelvis, subsequent encounter: Secondary | ICD-10-CM | POA: Diagnosis not present

## 2021-01-04 DIAGNOSIS — N1831 Chronic kidney disease, stage 3a: Secondary | ICD-10-CM | POA: Diagnosis not present

## 2021-01-04 DIAGNOSIS — I129 Hypertensive chronic kidney disease with stage 1 through stage 4 chronic kidney disease, or unspecified chronic kidney disease: Secondary | ICD-10-CM | POA: Diagnosis not present

## 2021-01-04 DIAGNOSIS — N183 Chronic kidney disease, stage 3 unspecified: Secondary | ICD-10-CM

## 2021-01-04 DIAGNOSIS — D649 Anemia, unspecified: Secondary | ICD-10-CM | POA: Diagnosis not present

## 2021-01-04 DIAGNOSIS — S3091XA Unspecified superficial injury of lower back and pelvis, initial encounter: Secondary | ICD-10-CM

## 2021-01-04 DIAGNOSIS — R296 Repeated falls: Secondary | ICD-10-CM | POA: Diagnosis not present

## 2021-01-04 DIAGNOSIS — W19XXXA Unspecified fall, initial encounter: Secondary | ICD-10-CM | POA: Diagnosis not present

## 2021-01-04 DIAGNOSIS — F0391 Unspecified dementia with behavioral disturbance: Secondary | ICD-10-CM

## 2021-01-04 DIAGNOSIS — S50812D Abrasion of left forearm, subsequent encounter: Secondary | ICD-10-CM | POA: Diagnosis not present

## 2021-01-04 DIAGNOSIS — T148XXA Other injury of unspecified body region, initial encounter: Secondary | ICD-10-CM

## 2021-01-04 DIAGNOSIS — M25562 Pain in left knee: Secondary | ICD-10-CM

## 2021-01-04 MED ORDER — ACETAMINOPHEN 325 MG PO TABS: 650.0000 mg | ORAL_TABLET | Freq: Three times a day (TID) | ORAL | 1 refills | Status: DC

## 2021-01-04 MED ORDER — DICLOFENAC SODIUM 1 % EX GEL: 4.0000 g | Freq: Four times a day (QID) | CUTANEOUS | 1 refills | Status: DC

## 2021-01-04 NOTE — Progress Notes (Addendum)
New Patient Office Visit  Subjective:  Patient ID: Kelly Freeman, female    DOB: 26-Apr-1927  Age: 87 y.o. MRN: [id number]  CC:  Chief Complaint  Patient presents with  . New Patient (Initial Visit)  . Transitions Of Care    HPI Kelly Freeman presents for for TCM and to establish care.  She is here with Kelly Freeman who is her caretaker, and Kelly Freeman who is her legal guardian.   Today's visit was for Transitional Care Management.  The patient was discharged from [facility] on 12/22/20 with a primary diagnosis of fall resulting in soft tissue hematoma on low back and UTI   Contact with the patient and/or caregiver, by a clinical staff member, was made on 12/23/20 and was documented as a telephone encounter within the EMR.  Through chart review and discussion with the patient I have determined that management of their condition is of moderate complexity.   Kelly Freeman had a fall that results on a large hematoma on her low back. She was in significant pain from this injurgy. She also has been having left knee pain since this fall as well. Pain was treated with low dose opiates. She was also experiencing delirium. Head CT was negative. Delirium thought to be due to underlying dementia, back pain, UTI, and hospitialization. She had a UTI on admission that was treated with ceftrizxone and then [facility] on discharge. Hemoglobin was 7.4 on admission. She declined iron infusion inpatient. She takes a daily iron supplement. She has a history of DVTs. Extremity duplex was negative for DVT, so xarelto was discontinue due to possible risks. CKD was at baseline. Antihypertensives were continued. Home PT was ordered. [facility] has been coming to [facility] were patient is a resident for PT and OT. They also drew a CBC and BMP today.   Kelly Freeman reports that the pain has improved. She did have a fall on Saturday without injury. She will go on 6/24 to see urology for recurrent UTIs. She finished up [place] on Sunday or Monday.  Reports behavior and dementia has been well controlled. Reports blood pressure has been on the lower side. She has been eating and drinking more. Denies chest pain, shortness of breath, edema, or dysuria.   Past Medical History:  Diagnosis Date  . Acute cystitis without hematuria   . Acute diarrhea   . Allergy   . Anxiety   . Blood transfusion without reported diagnosis 1949  . Cancer ([place])    skin cancer on face  . Cataract    bilateral  . Chronic allergic rhinitis   . CKD (chronic kidney disease) stage 3, GFR 30-59 ml/min (HCC)   . Cognitive impairment   . DDD (degenerative disc disease), lumbar   . Dizziness   . E. coli UTI   . Fever   . Gastroenteritis   . GERD (gastroesophageal reflux disease)   . Hereditary and idiopathic peripheral neuropathy   . History of recurrent UTIs   . Hypertension   . Hypertension   . MGUS (monoclonal gammopathy of unknown significance)   . Migraine without aura and without status migrainosus, not intractable   . Mixed incontinence urge and stress   . Moderate episode of recurrent major depressive disorder ([place])   . Nausea and vomiting   . Obesity   . Obstructive sleep apnea   . Osteopenia   . Recurrent falls   . Restless leg syndrome   . Sleep apnea   . UTI (urinary tract infection)  Past Surgical History:  Procedure Laterality Date  . APPENDECTOMY    . CHOLECYSTECTOMY    . EYE SURGERY     bilateral cataracts removed    Family History  Problem Relation Age of Onset  . Thrombosis Father 69       Coronary  . Stomach cancer Mother 71  . Arthritis Son 66  . Arthritis Daughter 39  . Cirrhosis Daughter 70       Stage 3   . Hepatitis C Daughter     Social History   Socioeconomic History  . Marital status: Widowed    Spouse name: Not on file  . Number of children: 3  . Years of education: 50  . Highest education level: High school graduate  Occupational History  . Not on file  Tobacco Use  . Smoking status: Never  Smoker  . Smokeless tobacco: Never Used  Vaping Use  . Vaping Use: Never used  Substance and Sexual Activity  . Alcohol use: Never  . Drug use: Never  . Sexual activity: Not Currently  Other Topics Concern  . Not on file  Social History Narrative   Diet:       Do you drink/ eat things with caffeine?  Yes      Marital status:   Widowed                            What year were you married ? 1948      Do you live in a house, apartment,assistred living, condo, trailer, etc.)? Trailer      Is it one or more stories? One      How many persons live in your home ? 2      Do you have any pets in your home ?(please list) NO      Current or past profession: Office Work      Do you exercise?   Not Much                           Type & how often:       Do you have a living will?       Do you have a DNR form?                       If not, do you want to discuss one?       Do you have signed POA?HPOA forms?                 If so, please bring to your        appointment      Social Determinants of Health   Financial Resource Strain: Not on file  Food Insecurity: Not on file  Transportation Needs: Not on file  Physical Activity: Not on file  Stress: Not on file  Social Connections: Not on file  Intimate Partner Violence: Not on file    ROS Review of Systems Negative unless specially indicated above in HPI.   Objective:   Today's Vitals: BP (!) 105/58   Pulse 65   Temp 98.3 F (36.8 C) (Temporal)   Ht 5\' 7"  (1.702 m)   Wt 142 lb 8 oz (64.6 kg)   BMI 22.32 kg/m   Physical Exam Vitals and nursing note reviewed.  Constitutional:      General: She is not in acute distress.  Appearance: She is not ill-appearing, toxic-appearing or diaphoretic.  HENT:     Head: Normocephalic and atraumatic.  Cardiovascular:     Rate and Rhythm: Normal rate and regular rhythm.     Heart sounds: Normal heart sounds.  Pulmonary:     Effort: Pulmonary effort is normal. No  respiratory distress.     Breath sounds: Normal breath sounds.  Abdominal:     General: There is no distension.     Palpations: Abdomen is soft.     Tenderness: There is no abdominal tenderness.  Musculoskeletal:     Lumbar back: Swelling and tenderness present.     Right lower leg: No edema.     Left lower leg: No edema.     Comments: Swelling and tenderness to left lower back with large palpable hematoma.   Skin:    General: Skin is warm and dry.  Neurological:     General: No focal deficit present.     Mental Status: She is alert. Mental status is at baseline.     Gait: Gait abnormal (wheelchair).  Psychiatric:        Behavior: Behavior normal.     Assessment & Plan:   Cierra was seen today for new patient (initial visit) and transitions of care.  Diagnoses and all orders for this visit:  Hematoma Tylenol and voltaren gel as below. Heating pad as needed.  -     diclofenac Sodium (VOLTAREN) 1 % GEL; Apply 4 g topically 4 (four) times daily. -     acetaminophen (TYLENOL) 325 MG tablet; Take 2 tablets (650 mg total) by mouth every 8 (eight) hours.  Recurrent falls [facility] is provided PT and OT at [facility].   Acute pain of left knee Tylenol and voltaren gel as below.  -     diclofenac Sodium (VOLTAREN) 1 % GEL; Apply 4 g topically 4 (four) times daily. -     acetaminophen (TYLENOL) 325 MG tablet; Take 2 tablets (650 mg total) by mouth every 8 (eight) hours.  Recurrent UTI Completed Omnicef. Will see urology in June. Will return with urine specimen for testing as below.  -     Urinalysis, Routine w reflex microscopic; Future -     Urine Culture; Future  Dementia with behavioral disturbance, unspecified dementia type [facility]) Resident at [facility] with memory care. Uses ABH gel and ativan prn.   Stage 3 chronic kidney disease, unspecified whether stage 3a or 3b CKD ([place]) Stable per ED summary. BMP was obtained this am by [facility], awaiting results.    Primary hypertension BP a little on the low end today and has been per family report. Discontinue HCTZ. CBC and BMP pending.  History of DVT (deep vein thrombosis) Negative extremity duplex in ED, Xalreto discontinued.   Encounter for support and coordination of transition of care Reviewed hospital records.   Encounter to establish care Reviewed available records.    Follow-up: Return in about 4 weeks (around 02/01/2021) for follow up.   The patient indicates understanding of these issues and agrees with the plan.  [name], FNP

## 2021-01-04 NOTE — Patient Instructions (Signed)
Hematoma A hematoma is a collection of blood under the skin, in an organ, in a body space, in a joint space, or in other tissue. The blood can thicken (clot) to form a lump that you can see and feel. The lump is often firm and may become sore and tender. Most hematomas get better in a few days to weeks. However, some hematomas may be serious and require medical care. Hematomas can range from very small to very large. What are the causes? This condition is caused by:  A blunt or penetrating injury.  A leakage from a blood vessel under the skin.  Some medical procedures, including surgeries, such as oral surgery, face lifts, and surgeries on the joints.  Some medical conditions that cause bleeding or bruising. There may be multiple hematomas that appear in different areas of the body. What increases the risk? You are more likely to develop this condition if:  You are an older adult.  You use blood thinners. What are the signs or symptoms? Symptoms of this condition depend on where the hematoma is located.  Common symptoms of a hematoma that is under the skin include:  A firm lump on the body.  Pain and tenderness in the area.  Bruising. Blue, dark blue, purple-red, or yellowish skin (discoloration) may appear at the site of the hematoma if the hematoma is close to the surface of the skin. Common symptoms of a hematoma that is deep in the tissues or body spaces may be less obvious. They include:  A collection of blood in the stomach (intra-abdominal hematoma). This may cause pain in the abdomen, weakness, fainting, and shortness of breath.  A collection of blood in the head (intracranial hematoma). This may cause a headache or symptoms such as weakness, trouble speaking or understanding, or a change in consciousness.   How is this diagnosed? This condition is diagnosed based on:  Your medical history.  A physical exam.  Imaging tests, such as an ultrasound or CT scan. These may  be needed if your health care provider suspects a hematoma in deeper tissues or body spaces.  Blood tests. These may be needed if your health care provider believes that the hematoma is caused by a medical condition. How is this treated? Treatment for this condition depends on the cause, size, and location of the hematoma. Treatment may include:  Doing nothing. The majority of hematomas do not need treatment as many of them go away on their own over time.  Surgery or close monitoring. This may be needed for large hematomas or hematomas that affect vital organs.  Medicines. Medicines may be given if there is an underlying medical cause for the hematoma. Follow these instructions at home: Managing pain, stiffness, and swelling  If directed, put ice on the affected area. ? Put ice in a plastic bag. ? Place a towel between your skin and the bag. ? Leave the ice on for 20 minutes, 2-3 times a day for the first couple of days.  If directed, apply heat to the affected area after applying ice for a couple of days. Use the heat source that your health care provider recommends, such as a moist heat pack or a heating pad. ? Place a towel between your skin and the heat source. ? Leave the heat on for 20-30 minutes. ? Remove the heat if your skin turns bright red. This is especially important if you are unable to feel pain, heat, or cold. You may have a greater  risk of getting burned.  Raise (elevate) the affected area above the level of your heart while you are sitting or lying down.  If told, wrap the affected area with an elastic bandage. The bandage applies pressure (compression) to the area, which may help to reduce swelling and promote healing. Do not wrap the bandage too tightly around the affected area.  If your hematoma is on a leg or foot (lower extremity) and is painful, your health care provider may recommend crutches. Use them as told by your health care provider.   General  instructions  Take over-the-counter and prescription medicines only as told by your health care provider.  Keep all follow-up visits as told by your health care provider. This is important. Contact a health care provider if:  You have a fever.  The swelling or discoloration gets worse.  You develop more hematomas. Get help right away if:  Your pain is worse or your pain is not controlled with medicine.  Your skin over the hematoma breaks or starts bleeding.  Your hematoma is in your chest or abdomen and you have weakness, shortness of breath, or a change in consciousness.  You have a hematoma on your scalp that is caused by a fall or injury, and you also have: ? A headache that gets worse. ? Trouble speaking or understanding speech. ? Weakness. ? Change in alertness or consciousness. Summary  A hematoma is a collection of blood under the skin, in an organ, in a body space, in a joint space, or in other tissue.  This condition usually does not need treatment because many hematomas go away on their own over time.  Large hematomas, or those that may affect vital organs, may need surgical drainage or monitoring. If the hematoma is caused by a medical condition, medicines may be prescribed.  Get help right away if your hematoma breaks or starts to bleed, you have shortness of breath, or you have a headache or trouble speaking after a fall. This information is not intended to replace advice given to you by your health care provider. Make sure you discuss any questions you have with your health care provider. Document Revised: 01/08/2019 Document Reviewed: 01/17/2018 Elsevier Patient Education  2021 [employer].

## 2021-01-05 ENCOUNTER — Other Ambulatory Visit: Payer: Self-pay

## 2021-01-05 DIAGNOSIS — N390 Urinary tract infection, site not specified: Secondary | ICD-10-CM | POA: Diagnosis not present

## 2021-01-05 DIAGNOSIS — D649 Anemia, unspecified: Secondary | ICD-10-CM | POA: Diagnosis not present

## 2021-01-05 DIAGNOSIS — W19XXXA Unspecified fall, initial encounter: Secondary | ICD-10-CM | POA: Diagnosis not present

## 2021-01-05 DIAGNOSIS — S300XXD Contusion of lower back and pelvis, subsequent encounter: Secondary | ICD-10-CM | POA: Diagnosis not present

## 2021-01-05 DIAGNOSIS — Z86718 Personal history of other venous thrombosis and embolism: Secondary | ICD-10-CM | POA: Diagnosis not present

## 2021-01-05 DIAGNOSIS — S50812D Abrasion of left forearm, subsequent encounter: Secondary | ICD-10-CM | POA: Diagnosis not present

## 2021-01-05 DIAGNOSIS — I129 Hypertensive chronic kidney disease with stage 1 through stage 4 chronic kidney disease, or unspecified chronic kidney disease: Secondary | ICD-10-CM | POA: Diagnosis not present

## 2021-01-05 DIAGNOSIS — N1831 Chronic kidney disease, stage 3a: Secondary | ICD-10-CM | POA: Diagnosis not present

## 2021-01-05 LAB — URINALYSIS, ROUTINE W REFLEX MICROSCOPIC
Bilirubin, UA: NEGATIVE
Glucose, UA: NEGATIVE
Ketones, UA: NEGATIVE
Leukocytes,UA: NEGATIVE
Nitrite, UA: NEGATIVE
Protein,UA: NEGATIVE
Specific Gravity, UA: 1.015 (ref 1.005–1.030)
Urobilinogen, Ur: 0.2 mg/dL (ref 0.2–1.0)
pH, UA: 6 (ref 5.0–7.5)

## 2021-01-05 LAB — MICROSCOPIC EXAMINATION
Bacteria, UA: NONE SEEN
RBC, Urine: NONE SEEN /hpf (ref 0–2)
WBC, UA: NONE SEEN /hpf (ref 0–5)

## 2021-01-06 ENCOUNTER — Encounter (HOSPITAL_COMMUNITY): Payer: Self-pay

## 2021-01-06 ENCOUNTER — Other Ambulatory Visit: Payer: Self-pay

## 2021-01-06 ENCOUNTER — Emergency Department (HOSPITAL_COMMUNITY)
Admission: EM | Admit: 2021-01-06 | Discharge: 2021-01-06 | Disposition: A | Discharge status: Home / Self Care | Payer: Medicare Other | Attending: Emergency Medicine | Admitting: Emergency Medicine

## 2021-01-06 ENCOUNTER — Emergency Department (HOSPITAL_COMMUNITY): Payer: Medicare Other

## 2021-01-06 DIAGNOSIS — W19XXXA Unspecified fall, initial encounter: Secondary | ICD-10-CM

## 2021-01-06 DIAGNOSIS — I129 Hypertensive chronic kidney disease with stage 1 through stage 4 chronic kidney disease, or unspecified chronic kidney disease: Secondary | ICD-10-CM | POA: Insufficient documentation

## 2021-01-06 DIAGNOSIS — W01198A Fall on same level from slipping, tripping and stumbling with subsequent striking against other object, initial encounter: Secondary | ICD-10-CM | POA: Insufficient documentation

## 2021-01-06 DIAGNOSIS — S0003XA Contusion of scalp, initial encounter: Secondary | ICD-10-CM | POA: Diagnosis not present

## 2021-01-06 DIAGNOSIS — M79605 Pain in left leg: Secondary | ICD-10-CM | POA: Diagnosis not present

## 2021-01-06 DIAGNOSIS — N183 Chronic kidney disease, stage 3 unspecified: Secondary | ICD-10-CM | POA: Insufficient documentation

## 2021-01-06 DIAGNOSIS — E1129 Type 2 diabetes mellitus with other diabetic kidney complication: Secondary | ICD-10-CM | POA: Diagnosis not present

## 2021-01-06 DIAGNOSIS — Z79899 Other long term (current) drug therapy: Secondary | ICD-10-CM | POA: Diagnosis not present

## 2021-01-06 DIAGNOSIS — S0990XA Unspecified injury of head, initial encounter: Secondary | ICD-10-CM | POA: Diagnosis present

## 2021-01-06 DIAGNOSIS — Z85828 Personal history of other malignant neoplasm of skin: Secondary | ICD-10-CM | POA: Insufficient documentation

## 2021-01-06 DIAGNOSIS — F0391 Unspecified dementia with behavioral disturbance: Secondary | ICD-10-CM | POA: Insufficient documentation

## 2021-01-06 DIAGNOSIS — R6889 Other general symptoms and signs: Secondary | ICD-10-CM | POA: Diagnosis not present

## 2021-01-06 DIAGNOSIS — Z743 Need for continuous supervision: Secondary | ICD-10-CM | POA: Diagnosis not present

## 2021-01-06 MED ORDER — ACETAMINOPHEN 325 MG PO TABS
650.0000 mg | ORAL_TABLET | Freq: Once | ORAL | Status: AC
  Administered 2021-01-06: 650 mg via ORAL
  Filled 2021-01-06: qty 2

## 2021-01-06 MED ORDER — LORAZEPAM 1 MG PO TABS
1.0000 mg | ORAL_TABLET | Freq: Once | ORAL | Status: AC
  Administered 2021-01-06: 1 mg via ORAL
  Filled 2021-01-06: qty 1

## 2021-01-06 NOTE — ED Notes (Signed)
Report called to [facility] at [phone], talked with  Janett Billow who then put me on hold and was on hold for the next 6:05 minutes, attempted to call back with no answer, attempted to call again and was hung up upon, attempted again and talked with Taya who transferred me to [facility], report given to [place], pt to be transported by granddaughter.

## 2021-01-06 NOTE — ED Provider Notes (Signed)
[facility] EMERGENCY DEPARTMENT Provider Note   CSN: [id number] Arrival date & time: 01/06/21  1324     History Chief Complaint  Patient presents with  . Fall    Kelly Freeman is a 87 y.o. female.  87 year old female with history of dementia, CKD, diabetes, MGUS, additional history as listed below. Report of fall a few weeks ago, blood thinner dc at that time, today, fall witnessed by facility staff. Staff reported patient appeared weak and then fell backwards, no LOC. Patient states she wants to go home, denies headaches or any other injuries or complaints. Patient is a poor historian, level 5 caveat applies. Granddaughter at bedside, reports patient just had labs collected yesterday including a urine and should have those results today, requesting no lab work today.        Past Medical History:  Diagnosis Date  . Acute cystitis without hematuria   . Acute diarrhea   . Allergy   . Anxiety   . Blood transfusion without reported diagnosis 1949  . Cancer ([place])    skin cancer on face  . Cataract    bilateral  . Chronic allergic rhinitis   . CKD (chronic kidney disease) stage 3, GFR 30-59 ml/min (HCC)   . Cognitive impairment   . DDD (degenerative disc disease), lumbar   . Dizziness   . E. coli UTI   . Fever   . Gastroenteritis   . GERD (gastroesophageal reflux disease)   . Hereditary and idiopathic peripheral neuropathy   . History of recurrent UTIs   . Hypertension   . Hypertension   . MGUS (monoclonal gammopathy of unknown significance)   . Migraine without aura and without status migrainosus, not intractable   . Mixed incontinence urge and stress   . Moderate episode of recurrent major depressive disorder ([place])   . Nausea and vomiting   . Obesity   . Obstructive sleep apnea   . Osteopenia   . Recurrent falls   . Restless leg syndrome   . Sleep apnea   . UTI (urinary tract infection)     Patient Active Problem List   Diagnosis Date Noted  . Dementia with  behavioral disturbance ([place]) 01/04/2021  . Pain   . Palliative care by specialist   . Goals of care, counseling/discussion   . Hematoma, nontraumatic, soft tissue 12/18/2020  . Fall at home, initial encounter 12/18/2020  . History of DVT (deep vein thrombosis) 12/18/2020  . Type 2 diabetes mellitus with kidney complication, with long-term current use of insulin ([place])   . Recurrent falls   . Moderate episode of recurrent major depressive disorder ([place])   . MGUS (monoclonal gammopathy of unknown significance)   . Primary hypertension   . History of recurrent UTIs   . CKD (chronic kidney disease) stage 3, GFR 30-59 ml/min (HCC)     Past Surgical History:  Procedure Laterality Date  . APPENDECTOMY    . CHOLECYSTECTOMY    . EYE SURGERY     bilateral cataracts removed     OB History   No obstetric history on file.     Family History  Problem Relation Age of Onset  . Thrombosis Father 16       Coronary  . Stomach cancer Mother 29  . Arthritis Son 22  . Arthritis Daughter 87  . Cirrhosis Daughter 60       Stage 3   . Hepatitis C Daughter     Social History   Tobacco  Use  . Smoking status: Never Smoker  . Smokeless tobacco: Never Used  Vaping Use  . Vaping Use: Never used  Substance Use Topics  . Alcohol use: Never  . Drug use: Never    Home Medications Prior to Admission medications   Medication Sig Start Date End Date Taking? Authorizing Provider  acetaminophen (TYLENOL) 325 MG tablet Take 2 tablets (650 mg total) by mouth every 8 (eight) hours. 01/04/21  Yes [name], FNP  ferrous sulfate 325 (65 FE) MG tablet Take 325 mg by mouth every morning.   Yes [provider]  lisinopril (ZESTRIL) 2.5 MG tablet Take 1 tablet (2.5 mg total) by mouth daily. 09/23/19  Yes Lauree Chandler, NP  Melatonin 5 MG TABS Take 5 mg by mouth at bedtime.   Yes [provider]  diclofenac Sodium (VOLTAREN) 1 % GEL Apply 4 g topically 4 (four) times  daily. Patient not taking: Reported on 01/06/2021 01/04/21   [name], FNP  lidocaine (LIDODERM) 5 % Place 1 patch onto the skin daily. Remove & Discard patch within 12 hours or as directed by MD Patient not taking: Reported on 01/06/2021 12/22/20   Barb Merino, MD  LORazepam (ATIVAN) 0.5 MG tablet Take 0.25 mg by mouth every 12 (twelve) hours as needed for anxiety (agitation). Patient not taking: Reported on 01/06/2021    [provider]  sertraline (ZOLOFT) 20 MG/ML concentrated solution Take 60 mg by mouth every morning. Patient not taking: Reported on 01/06/2021    [provider]    Allergies    Levofloxacin and Gabapentin  Review of Systems   Review of Systems  Unable to perform ROS: Dementia    Physical Exam Updated Vital Signs BP (!) 166/145   Pulse 66   Temp 98.8 F (37.1 C) (Oral)   Resp 18   Ht 5\' 7"  (1.702 m)   Wt 64.6 kg   SpO2 95%   BMI 22.31 kg/m   Physical Exam Vitals and nursing note reviewed.  Constitutional:      General: She is not in acute distress.    Appearance: She is well-developed. She is not diaphoretic.    HENT:     Head: Normocephalic.  Eyes:     Extraocular Movements: Extraocular movements intact.     Pupils: Pupils are equal, round, and reactive to light.  Cardiovascular:     Rate and Rhythm: Normal rate and regular rhythm.     Pulses: Normal pulses.     Heart sounds: Normal heart sounds.  Pulmonary:     Effort: Pulmonary effort is normal.     Breath sounds: Normal breath sounds.  Abdominal:     Tenderness: There is no abdominal tenderness.  Musculoskeletal:        General: No swelling or tenderness.     Cervical back: Normal range of motion and neck supple. No tenderness.     Right lower leg: No edema.     Left lower leg: No edema.  Skin:    General: Skin is warm and dry.     Findings: No erythema or rash.  Neurological:     Mental Status: She is alert. Mental status is at baseline.  Psychiatric:         Behavior: Behavior normal.     ED Results / Procedures / Treatments   Labs (all labs ordered are listed, but only abnormal results are displayed) Labs Reviewed - No data to display  EKG None  Radiology  No results found.  Procedures Procedures   Medications Ordered in ED Medications  acetaminophen (TYLENOL) tablet 650 mg (650 mg Oral Given 01/06/21 1232)  LORazepam (ATIVAN) tablet 1 mg (1 mg Oral Given 01/06/21 1255)    ED Course  I have reviewed the triage vital signs and the nursing notes.  Pertinent labs & imaging results that were available during my care of the patient were reviewed by me and considered in my medical decision making (see chart for details).  Clinical Course as of 01/06/21 1516  Thu Jan 07, 2051  [id number] 87 year old female with dementia brought in by granddaughter from nursing facility after a witnessed fall today.  Patient is not anticoagulated, denies complaints and is reported to be at baseline mental status per granddaughter.  Patient is found to have a small hematoma to posterior scalp without any other acute injuries, moves extremities equally, does not have midline or bony tenderness through the neck or back. Attempted CT head and C-spine however patient sat up on the CT scan table and is unable to complete the study.  Patient was given p.o. Ativan and attempted repeat study without success. Patient was observed in the emergency room at 6 hours, remains at her baseline mental status, no new findings. Discussed with granddaughter who is agreeable with discharge home at this time.  Discussed with Dr. Roderic Palau who agrees with plan of care. [LM]    Clinical Course User Index [LM] Roque Lias   MDM Rules/Calculators/A&P                         Final Clinical Impression(s) / ED Diagnoses Final diagnoses:  Fall, initial encounter  Contusion of scalp, initial encounter    Rx / DC Orders ED Discharge Orders    None       Roque Lias 01/06/21 1516    Milton Ferguson, MD 01/07/21 [phone]

## 2021-01-06 NOTE — ED Triage Notes (Signed)
Patient to ED from [facility] after witnessed fall by daughter. EMS reports that patient became weak and fell backwards striking head. Complaining of coccyx and posterior head pain. Noted with bruising about body from previous falls. Blood thinner stopped two weeks ago after previous fall.

## 2021-01-06 NOTE — ED Triage Notes (Signed)
Noted with discoloration to coccyx area and swelling to lower back.

## 2021-01-06 NOTE — ED Notes (Signed)
Second ct attempt unsuccessful, pa notified .

## 2021-01-07 DIAGNOSIS — S50812D Abrasion of left forearm, subsequent encounter: Secondary | ICD-10-CM | POA: Diagnosis not present

## 2021-01-07 DIAGNOSIS — I129 Hypertensive chronic kidney disease with stage 1 through stage 4 chronic kidney disease, or unspecified chronic kidney disease: Secondary | ICD-10-CM | POA: Diagnosis not present

## 2021-01-07 DIAGNOSIS — W19XXXA Unspecified fall, initial encounter: Secondary | ICD-10-CM | POA: Diagnosis not present

## 2021-01-07 DIAGNOSIS — S300XXD Contusion of lower back and pelvis, subsequent encounter: Secondary | ICD-10-CM | POA: Diagnosis not present

## 2021-01-07 DIAGNOSIS — N1831 Chronic kidney disease, stage 3a: Secondary | ICD-10-CM | POA: Diagnosis not present

## 2021-01-07 DIAGNOSIS — N390 Urinary tract infection, site not specified: Secondary | ICD-10-CM | POA: Diagnosis not present

## 2021-01-07 DIAGNOSIS — D649 Anemia, unspecified: Secondary | ICD-10-CM | POA: Diagnosis not present

## 2021-01-07 DIAGNOSIS — Z86718 Personal history of other venous thrombosis and embolism: Secondary | ICD-10-CM | POA: Diagnosis not present

## 2021-01-07 LAB — URINE CULTURE

## 2021-01-10 DIAGNOSIS — Z86718 Personal history of other venous thrombosis and embolism: Secondary | ICD-10-CM | POA: Diagnosis not present

## 2021-01-10 DIAGNOSIS — S50812D Abrasion of left forearm, subsequent encounter: Secondary | ICD-10-CM | POA: Diagnosis not present

## 2021-01-10 DIAGNOSIS — I129 Hypertensive chronic kidney disease with stage 1 through stage 4 chronic kidney disease, or unspecified chronic kidney disease: Secondary | ICD-10-CM | POA: Diagnosis not present

## 2021-01-10 DIAGNOSIS — N390 Urinary tract infection, site not specified: Secondary | ICD-10-CM | POA: Diagnosis not present

## 2021-01-10 DIAGNOSIS — S300XXD Contusion of lower back and pelvis, subsequent encounter: Secondary | ICD-10-CM | POA: Diagnosis not present

## 2021-01-10 DIAGNOSIS — D649 Anemia, unspecified: Secondary | ICD-10-CM | POA: Diagnosis not present

## 2021-01-10 DIAGNOSIS — N1831 Chronic kidney disease, stage 3a: Secondary | ICD-10-CM | POA: Diagnosis not present

## 2021-01-10 DIAGNOSIS — W19XXXA Unspecified fall, initial encounter: Secondary | ICD-10-CM | POA: Diagnosis not present

## 2021-01-11 ENCOUNTER — Telehealth: Payer: Self-pay | Admitting: *Deleted

## 2021-01-11 DIAGNOSIS — Z86718 Personal history of other venous thrombosis and embolism: Secondary | ICD-10-CM | POA: Diagnosis not present

## 2021-01-11 DIAGNOSIS — N1831 Chronic kidney disease, stage 3a: Secondary | ICD-10-CM | POA: Diagnosis not present

## 2021-01-11 DIAGNOSIS — S50812D Abrasion of left forearm, subsequent encounter: Secondary | ICD-10-CM | POA: Diagnosis not present

## 2021-01-11 DIAGNOSIS — I129 Hypertensive chronic kidney disease with stage 1 through stage 4 chronic kidney disease, or unspecified chronic kidney disease: Secondary | ICD-10-CM | POA: Diagnosis not present

## 2021-01-11 DIAGNOSIS — S300XXD Contusion of lower back and pelvis, subsequent encounter: Secondary | ICD-10-CM | POA: Diagnosis not present

## 2021-01-11 DIAGNOSIS — D649 Anemia, unspecified: Secondary | ICD-10-CM | POA: Diagnosis not present

## 2021-01-11 DIAGNOSIS — N390 Urinary tract infection, site not specified: Secondary | ICD-10-CM | POA: Diagnosis not present

## 2021-01-11 DIAGNOSIS — W19XXXA Unspecified fall, initial encounter: Secondary | ICD-10-CM | POA: Diagnosis not present

## 2021-01-11 NOTE — Telephone Encounter (Signed)
VM from [facility] w/ [facility] Pt does not have any Lidocaine patches Last refilled by Dr. Lonzo Cloud Please advise

## 2021-01-12 ENCOUNTER — Ambulatory Visit (INDEPENDENT_AMBULATORY_CARE_PROVIDER_SITE_OTHER): Payer: Medicare Other

## 2021-01-12 ENCOUNTER — Other Ambulatory Visit: Payer: Self-pay | Admitting: Family Medicine

## 2021-01-12 ENCOUNTER — Other Ambulatory Visit: Payer: Self-pay

## 2021-01-12 DIAGNOSIS — F0390 Unspecified dementia without behavioral disturbance: Secondary | ICD-10-CM | POA: Diagnosis not present

## 2021-01-12 DIAGNOSIS — S300XXD Contusion of lower back and pelvis, subsequent encounter: Secondary | ICD-10-CM | POA: Diagnosis not present

## 2021-01-12 DIAGNOSIS — Z86718 Personal history of other venous thrombosis and embolism: Secondary | ICD-10-CM

## 2021-01-12 DIAGNOSIS — N390 Urinary tract infection, site not specified: Secondary | ICD-10-CM

## 2021-01-12 DIAGNOSIS — D649 Anemia, unspecified: Secondary | ICD-10-CM | POA: Diagnosis not present

## 2021-01-12 DIAGNOSIS — M545 Low back pain, unspecified: Secondary | ICD-10-CM

## 2021-01-12 DIAGNOSIS — S50812D Abrasion of left forearm, subsequent encounter: Secondary | ICD-10-CM | POA: Diagnosis not present

## 2021-01-12 DIAGNOSIS — N1831 Chronic kidney disease, stage 3a: Secondary | ICD-10-CM | POA: Diagnosis not present

## 2021-01-12 DIAGNOSIS — I129 Hypertensive chronic kidney disease with stage 1 through stage 4 chronic kidney disease, or unspecified chronic kidney disease: Secondary | ICD-10-CM

## 2021-01-12 DIAGNOSIS — W19XXXA Unspecified fall, initial encounter: Secondary | ICD-10-CM

## 2021-01-12 DIAGNOSIS — F329 Major depressive disorder, single episode, unspecified: Secondary | ICD-10-CM

## 2021-01-12 MED ORDER — LIDOCAINE 5 % EX PTCH: 1.0000 | MEDICATED_PATCH | CUTANEOUS | 0 refills | Status: DC

## 2021-01-12 NOTE — Telephone Encounter (Signed)
TTC [place] with [facility] but number busy so called pt's daughter and advised her rx was sent in.

## 2021-01-12 NOTE — Telephone Encounter (Signed)
Refill sent in

## 2021-01-13 ENCOUNTER — Telehealth: Payer: Self-pay | Admitting: *Deleted

## 2021-01-13 DIAGNOSIS — S300XXD Contusion of lower back and pelvis, subsequent encounter: Secondary | ICD-10-CM | POA: Diagnosis not present

## 2021-01-13 DIAGNOSIS — N390 Urinary tract infection, site not specified: Secondary | ICD-10-CM | POA: Diagnosis not present

## 2021-01-13 DIAGNOSIS — W19XXXA Unspecified fall, initial encounter: Secondary | ICD-10-CM | POA: Diagnosis not present

## 2021-01-13 DIAGNOSIS — N1831 Chronic kidney disease, stage 3a: Secondary | ICD-10-CM | POA: Diagnosis not present

## 2021-01-13 DIAGNOSIS — S50812D Abrasion of left forearm, subsequent encounter: Secondary | ICD-10-CM | POA: Diagnosis not present

## 2021-01-13 DIAGNOSIS — I129 Hypertensive chronic kidney disease with stage 1 through stage 4 chronic kidney disease, or unspecified chronic kidney disease: Secondary | ICD-10-CM | POA: Diagnosis not present

## 2021-01-13 DIAGNOSIS — Z86718 Personal history of other venous thrombosis and embolism: Secondary | ICD-10-CM | POA: Diagnosis not present

## 2021-01-13 DIAGNOSIS — D649 Anemia, unspecified: Secondary | ICD-10-CM | POA: Diagnosis not present

## 2021-01-13 NOTE — Telephone Encounter (Signed)
FYI noted and aware.

## 2021-01-13 NOTE — Telephone Encounter (Signed)
FYI: VM from [facility] w/ [facility] Reporting pt had an unwitnessed fall, she was located on the floor, she was able to get up, no c/o pain, vitals WNL

## 2021-01-14 ENCOUNTER — Telehealth: Payer: Self-pay | Admitting: *Deleted

## 2021-01-14 DIAGNOSIS — I129 Hypertensive chronic kidney disease with stage 1 through stage 4 chronic kidney disease, or unspecified chronic kidney disease: Secondary | ICD-10-CM | POA: Diagnosis not present

## 2021-01-14 DIAGNOSIS — S50812D Abrasion of left forearm, subsequent encounter: Secondary | ICD-10-CM | POA: Diagnosis not present

## 2021-01-14 DIAGNOSIS — W19XXXA Unspecified fall, initial encounter: Secondary | ICD-10-CM | POA: Diagnosis not present

## 2021-01-14 DIAGNOSIS — S300XXD Contusion of lower back and pelvis, subsequent encounter: Secondary | ICD-10-CM | POA: Diagnosis not present

## 2021-01-14 DIAGNOSIS — N390 Urinary tract infection, site not specified: Secondary | ICD-10-CM | POA: Diagnosis not present

## 2021-01-14 DIAGNOSIS — D649 Anemia, unspecified: Secondary | ICD-10-CM | POA: Diagnosis not present

## 2021-01-14 DIAGNOSIS — Z86718 Personal history of other venous thrombosis and embolism: Secondary | ICD-10-CM | POA: Diagnosis not present

## 2021-01-14 DIAGNOSIS — N1831 Chronic kidney disease, stage 3a: Secondary | ICD-10-CM | POA: Diagnosis not present

## 2021-01-14 NOTE — Telephone Encounter (Signed)
That is fine 

## 2021-01-14 NOTE — Telephone Encounter (Signed)
Left message advising ok to draw early next week.

## 2021-01-14 NOTE — Telephone Encounter (Signed)
Vm from Kelly Freeman w/ [facility] Was not able to get BMP today, only stuck her once, pt would not allow her to stick her again, actually pulled away during the first attempt. Do you want her to attempt to get beginning of the week

## 2021-01-17 ENCOUNTER — Encounter: Payer: Self-pay | Admitting: Family Medicine

## 2021-01-17 DIAGNOSIS — Z86718 Personal history of other venous thrombosis and embolism: Secondary | ICD-10-CM | POA: Diagnosis not present

## 2021-01-17 DIAGNOSIS — N1831 Chronic kidney disease, stage 3a: Secondary | ICD-10-CM | POA: Diagnosis not present

## 2021-01-17 DIAGNOSIS — W19XXXA Unspecified fall, initial encounter: Secondary | ICD-10-CM | POA: Diagnosis not present

## 2021-01-17 DIAGNOSIS — I129 Hypertensive chronic kidney disease with stage 1 through stage 4 chronic kidney disease, or unspecified chronic kidney disease: Secondary | ICD-10-CM | POA: Diagnosis not present

## 2021-01-17 DIAGNOSIS — D649 Anemia, unspecified: Secondary | ICD-10-CM | POA: Diagnosis not present

## 2021-01-17 DIAGNOSIS — S300XXD Contusion of lower back and pelvis, subsequent encounter: Secondary | ICD-10-CM | POA: Diagnosis not present

## 2021-01-17 DIAGNOSIS — N390 Urinary tract infection, site not specified: Secondary | ICD-10-CM | POA: Diagnosis not present

## 2021-01-17 DIAGNOSIS — S50812D Abrasion of left forearm, subsequent encounter: Secondary | ICD-10-CM | POA: Diagnosis not present

## 2021-01-18 DIAGNOSIS — S50812D Abrasion of left forearm, subsequent encounter: Secondary | ICD-10-CM | POA: Diagnosis not present

## 2021-01-18 DIAGNOSIS — S300XXD Contusion of lower back and pelvis, subsequent encounter: Secondary | ICD-10-CM | POA: Diagnosis not present

## 2021-01-18 DIAGNOSIS — Z86718 Personal history of other venous thrombosis and embolism: Secondary | ICD-10-CM | POA: Diagnosis not present

## 2021-01-18 DIAGNOSIS — N390 Urinary tract infection, site not specified: Secondary | ICD-10-CM | POA: Diagnosis not present

## 2021-01-18 DIAGNOSIS — N1831 Chronic kidney disease, stage 3a: Secondary | ICD-10-CM | POA: Diagnosis not present

## 2021-01-18 DIAGNOSIS — I129 Hypertensive chronic kidney disease with stage 1 through stage 4 chronic kidney disease, or unspecified chronic kidney disease: Secondary | ICD-10-CM | POA: Diagnosis not present

## 2021-01-18 DIAGNOSIS — W19XXXA Unspecified fall, initial encounter: Secondary | ICD-10-CM | POA: Diagnosis not present

## 2021-01-18 DIAGNOSIS — D649 Anemia, unspecified: Secondary | ICD-10-CM | POA: Diagnosis not present

## 2021-01-19 ENCOUNTER — Other Ambulatory Visit: Payer: Self-pay | Admitting: Family Medicine

## 2021-01-19 DIAGNOSIS — W19XXXA Unspecified fall, initial encounter: Secondary | ICD-10-CM | POA: Diagnosis not present

## 2021-01-19 DIAGNOSIS — N1831 Chronic kidney disease, stage 3a: Secondary | ICD-10-CM | POA: Diagnosis not present

## 2021-01-19 DIAGNOSIS — S300XXD Contusion of lower back and pelvis, subsequent encounter: Secondary | ICD-10-CM | POA: Diagnosis not present

## 2021-01-19 DIAGNOSIS — R238 Other skin changes: Secondary | ICD-10-CM

## 2021-01-19 DIAGNOSIS — D649 Anemia, unspecified: Secondary | ICD-10-CM | POA: Diagnosis not present

## 2021-01-19 DIAGNOSIS — Z86718 Personal history of other venous thrombosis and embolism: Secondary | ICD-10-CM | POA: Diagnosis not present

## 2021-01-19 DIAGNOSIS — S50812D Abrasion of left forearm, subsequent encounter: Secondary | ICD-10-CM | POA: Diagnosis not present

## 2021-01-19 DIAGNOSIS — N390 Urinary tract infection, site not specified: Secondary | ICD-10-CM | POA: Diagnosis not present

## 2021-01-19 DIAGNOSIS — I129 Hypertensive chronic kidney disease with stage 1 through stage 4 chronic kidney disease, or unspecified chronic kidney disease: Secondary | ICD-10-CM | POA: Diagnosis not present

## 2021-01-19 MED ORDER — ZINC OXIDE 11.3 % EX CREA: 1.0000 "application " | TOPICAL_CREAM | Freq: Two times a day (BID) | CUTANEOUS | 1 refills | Status: AC

## 2021-01-22 DIAGNOSIS — N1831 Chronic kidney disease, stage 3a: Secondary | ICD-10-CM | POA: Diagnosis not present

## 2021-01-22 DIAGNOSIS — S50812D Abrasion of left forearm, subsequent encounter: Secondary | ICD-10-CM | POA: Diagnosis not present

## 2021-01-22 DIAGNOSIS — S300XXD Contusion of lower back and pelvis, subsequent encounter: Secondary | ICD-10-CM | POA: Diagnosis not present

## 2021-01-22 DIAGNOSIS — Z86718 Personal history of other venous thrombosis and embolism: Secondary | ICD-10-CM | POA: Diagnosis not present

## 2021-01-22 DIAGNOSIS — D649 Anemia, unspecified: Secondary | ICD-10-CM | POA: Diagnosis not present

## 2021-01-22 DIAGNOSIS — I129 Hypertensive chronic kidney disease with stage 1 through stage 4 chronic kidney disease, or unspecified chronic kidney disease: Secondary | ICD-10-CM | POA: Diagnosis not present

## 2021-01-22 DIAGNOSIS — N390 Urinary tract infection, site not specified: Secondary | ICD-10-CM | POA: Diagnosis not present

## 2021-01-22 DIAGNOSIS — W19XXXA Unspecified fall, initial encounter: Secondary | ICD-10-CM | POA: Diagnosis not present

## 2021-01-25 DIAGNOSIS — D649 Anemia, unspecified: Secondary | ICD-10-CM | POA: Diagnosis not present

## 2021-01-25 DIAGNOSIS — W19XXXA Unspecified fall, initial encounter: Secondary | ICD-10-CM | POA: Diagnosis not present

## 2021-01-25 DIAGNOSIS — Z86718 Personal history of other venous thrombosis and embolism: Secondary | ICD-10-CM | POA: Diagnosis not present

## 2021-01-25 DIAGNOSIS — N1831 Chronic kidney disease, stage 3a: Secondary | ICD-10-CM | POA: Diagnosis not present

## 2021-01-25 DIAGNOSIS — I129 Hypertensive chronic kidney disease with stage 1 through stage 4 chronic kidney disease, or unspecified chronic kidney disease: Secondary | ICD-10-CM | POA: Diagnosis not present

## 2021-01-25 DIAGNOSIS — S50812D Abrasion of left forearm, subsequent encounter: Secondary | ICD-10-CM | POA: Diagnosis not present

## 2021-01-25 DIAGNOSIS — S300XXD Contusion of lower back and pelvis, subsequent encounter: Secondary | ICD-10-CM | POA: Diagnosis not present

## 2021-01-25 DIAGNOSIS — N390 Urinary tract infection, site not specified: Secondary | ICD-10-CM | POA: Diagnosis not present

## 2021-01-26 DIAGNOSIS — W19XXXA Unspecified fall, initial encounter: Secondary | ICD-10-CM | POA: Diagnosis not present

## 2021-01-26 DIAGNOSIS — I129 Hypertensive chronic kidney disease with stage 1 through stage 4 chronic kidney disease, or unspecified chronic kidney disease: Secondary | ICD-10-CM | POA: Diagnosis not present

## 2021-01-26 DIAGNOSIS — S50812D Abrasion of left forearm, subsequent encounter: Secondary | ICD-10-CM | POA: Diagnosis not present

## 2021-01-26 DIAGNOSIS — N1831 Chronic kidney disease, stage 3a: Secondary | ICD-10-CM | POA: Diagnosis not present

## 2021-01-26 DIAGNOSIS — S300XXD Contusion of lower back and pelvis, subsequent encounter: Secondary | ICD-10-CM | POA: Diagnosis not present

## 2021-01-26 DIAGNOSIS — D649 Anemia, unspecified: Secondary | ICD-10-CM | POA: Diagnosis not present

## 2021-01-26 DIAGNOSIS — Z86718 Personal history of other venous thrombosis and embolism: Secondary | ICD-10-CM | POA: Diagnosis not present

## 2021-01-26 DIAGNOSIS — N390 Urinary tract infection, site not specified: Secondary | ICD-10-CM | POA: Diagnosis not present

## 2021-01-27 DIAGNOSIS — I129 Hypertensive chronic kidney disease with stage 1 through stage 4 chronic kidney disease, or unspecified chronic kidney disease: Secondary | ICD-10-CM | POA: Diagnosis not present

## 2021-01-27 DIAGNOSIS — S50812D Abrasion of left forearm, subsequent encounter: Secondary | ICD-10-CM | POA: Diagnosis not present

## 2021-01-27 DIAGNOSIS — D649 Anemia, unspecified: Secondary | ICD-10-CM | POA: Diagnosis not present

## 2021-01-27 DIAGNOSIS — N390 Urinary tract infection, site not specified: Secondary | ICD-10-CM | POA: Diagnosis not present

## 2021-01-27 DIAGNOSIS — N1831 Chronic kidney disease, stage 3a: Secondary | ICD-10-CM | POA: Diagnosis not present

## 2021-01-27 DIAGNOSIS — Z86718 Personal history of other venous thrombosis and embolism: Secondary | ICD-10-CM | POA: Diagnosis not present

## 2021-01-27 DIAGNOSIS — W19XXXA Unspecified fall, initial encounter: Secondary | ICD-10-CM | POA: Diagnosis not present

## 2021-01-27 DIAGNOSIS — S300XXD Contusion of lower back and pelvis, subsequent encounter: Secondary | ICD-10-CM | POA: Diagnosis not present

## 2021-01-28 ENCOUNTER — Ambulatory Visit (INDEPENDENT_AMBULATORY_CARE_PROVIDER_SITE_OTHER): Payer: Medicare Other | Admitting: Family Medicine

## 2021-01-28 ENCOUNTER — Encounter: Payer: Self-pay | Admitting: Family Medicine

## 2021-01-28 DIAGNOSIS — I129 Hypertensive chronic kidney disease with stage 1 through stage 4 chronic kidney disease, or unspecified chronic kidney disease: Secondary | ICD-10-CM | POA: Diagnosis not present

## 2021-01-28 DIAGNOSIS — N1831 Chronic kidney disease, stage 3a: Secondary | ICD-10-CM | POA: Diagnosis not present

## 2021-01-28 DIAGNOSIS — W19XXXA Unspecified fall, initial encounter: Secondary | ICD-10-CM | POA: Diagnosis not present

## 2021-01-28 DIAGNOSIS — Z86718 Personal history of other venous thrombosis and embolism: Secondary | ICD-10-CM | POA: Diagnosis not present

## 2021-01-28 DIAGNOSIS — R399 Unspecified symptoms and signs involving the genitourinary system: Secondary | ICD-10-CM | POA: Diagnosis not present

## 2021-01-28 DIAGNOSIS — S50812D Abrasion of left forearm, subsequent encounter: Secondary | ICD-10-CM | POA: Diagnosis not present

## 2021-01-28 DIAGNOSIS — S300XXD Contusion of lower back and pelvis, subsequent encounter: Secondary | ICD-10-CM | POA: Diagnosis not present

## 2021-01-28 DIAGNOSIS — D649 Anemia, unspecified: Secondary | ICD-10-CM | POA: Diagnosis not present

## 2021-01-28 DIAGNOSIS — N390 Urinary tract infection, site not specified: Secondary | ICD-10-CM | POA: Diagnosis not present

## 2021-01-28 LAB — MICROSCOPIC EXAMINATION

## 2021-01-28 LAB — URINALYSIS, ROUTINE W REFLEX MICROSCOPIC
Bilirubin, UA: NEGATIVE
Glucose, UA: NEGATIVE
Ketones, UA: NEGATIVE
Leukocytes,UA: NEGATIVE
Nitrite, UA: POSITIVE — AB
Protein,UA: NEGATIVE
RBC, UA: NEGATIVE
Specific Gravity, UA: 1.02 (ref 1.005–1.030)
Urobilinogen, Ur: 0.2 mg/dL (ref 0.2–1.0)
pH, UA: 5 (ref 5.0–7.5)

## 2021-01-28 MED ORDER — CEPHALEXIN 500 MG PO CAPS: 500.0000 mg | ORAL_CAPSULE | Freq: Two times a day (BID) | ORAL | 0 refills | Status: DC

## 2021-01-28 NOTE — Addendum Note (Signed)
Addended by: [name] on: 01/28/2021 04:11 PM   Modules accepted: Orders

## 2021-01-28 NOTE — Progress Notes (Signed)
   Virtual Visit  Note Due to COVID-19 pandemic this visit was conducted virtually. This visit type was conducted due to national recommendations for restrictions regarding the COVID-19 Pandemic (e.g. social distancing, sheltering in place) in an effort to limit this patient's exposure and mitigate transmission in our community. All issues noted in this document were discussed and addressed.  A physical exam was not performed with this format.  I connected with Kelly Freeman on 01/28/21 at 1311 by telephone and verified that I am speaking with the correct person using two identifiers. Kelly Freeman is currently located at assisted living room and her grandaughter is currently with her during the visit. The provider, [name], FNP is located in their office at time of visit.  I discussed the limitations, risks, security and privacy concerns of performing an evaluation and management service by telephone and the availability of in person appointments. I also discussed with the patient that there may be a patient responsible charge related to this service. The patient expressed understanding and agreed to proceed.  CC: UTI symptoms  History and Present Illness:  HPI  History was provided by Kelly Freeman's granddaughter [name]. Kelly Freeman has had urinary frequency, urgency, and odor for 4-5 days. Yesterday she was also combative with staff members. She has a history of frequent UTIs. She has not had a fever, chills, or flank pain. Kelly Freeman has been trying to collect a sample to bring to the office for testing, however Kelly Freeman has not been letting her do this.     ROS As per HPI.   Observations/Objective: Deferred for telephone visit.   Assessment and Plan: Kelly Freeman was seen today for urinary tract infection.  Diagnoses and all orders for this visit:  UTI symptoms Unable to obtain sample today. Given history of recurrent UTIs will treat with keflex as below.  -     cephALEXin (KEFLEX) 500 MG capsule;  Take 1 capsule (500 mg total) by mouth 2 (two) times daily for 7 days.     Follow Up Instructions: As needed.     I discussed the assessment and treatment plan with the patient. The patient was provided an opportunity to ask questions and all were answered. The patient agreed with the plan and demonstrated an understanding of the instructions.   The patient was advised to call back or seek an in-person evaluation if the symptoms worsen or if the condition fails to improve as anticipated.  The above assessment and management plan was discussed with the patient. The patient verbalized understanding of and has agreed to the management plan. Patient is aware to call the clinic if symptoms persist or worsen. Patient is aware when to return to the clinic for a follow-up visit. Patient educated on when it is appropriate to go to the emergency department.   Time call ended:  1323  I provided 12 minutes of  non face-to-face time during this encounter.    [name], FNP

## 2021-01-31 DIAGNOSIS — D649 Anemia, unspecified: Secondary | ICD-10-CM | POA: Diagnosis not present

## 2021-01-31 DIAGNOSIS — Z86718 Personal history of other venous thrombosis and embolism: Secondary | ICD-10-CM | POA: Diagnosis not present

## 2021-01-31 DIAGNOSIS — S300XXD Contusion of lower back and pelvis, subsequent encounter: Secondary | ICD-10-CM | POA: Diagnosis not present

## 2021-01-31 DIAGNOSIS — N390 Urinary tract infection, site not specified: Secondary | ICD-10-CM | POA: Diagnosis not present

## 2021-01-31 DIAGNOSIS — I129 Hypertensive chronic kidney disease with stage 1 through stage 4 chronic kidney disease, or unspecified chronic kidney disease: Secondary | ICD-10-CM | POA: Diagnosis not present

## 2021-01-31 DIAGNOSIS — S50812D Abrasion of left forearm, subsequent encounter: Secondary | ICD-10-CM | POA: Diagnosis not present

## 2021-01-31 DIAGNOSIS — N1831 Chronic kidney disease, stage 3a: Secondary | ICD-10-CM | POA: Diagnosis not present

## 2021-01-31 DIAGNOSIS — W19XXXA Unspecified fall, initial encounter: Secondary | ICD-10-CM | POA: Diagnosis not present

## 2021-02-01 ENCOUNTER — Other Ambulatory Visit: Payer: Self-pay

## 2021-02-01 ENCOUNTER — Ambulatory Visit (INDEPENDENT_AMBULATORY_CARE_PROVIDER_SITE_OTHER): Payer: Medicare Other | Admitting: Family Medicine

## 2021-02-01 VITALS — BP 139/68 | HR 71 | Ht 67.0 in | Wt 145.0 lb

## 2021-02-01 DIAGNOSIS — N183 Chronic kidney disease, stage 3 unspecified: Secondary | ICD-10-CM

## 2021-02-01 DIAGNOSIS — I10 Essential (primary) hypertension: Secondary | ICD-10-CM

## 2021-02-01 DIAGNOSIS — N390 Urinary tract infection, site not specified: Secondary | ICD-10-CM | POA: Insufficient documentation

## 2021-02-01 DIAGNOSIS — T148XXA Other injury of unspecified body region, initial encounter: Secondary | ICD-10-CM

## 2021-02-01 DIAGNOSIS — R609 Edema, unspecified: Secondary | ICD-10-CM

## 2021-02-01 MED ORDER — CEPHALEXIN 250 MG PO CAPS: 250.0000 mg | ORAL_CAPSULE | Freq: Every day | ORAL | 2 refills | Status: DC

## 2021-02-01 MED ORDER — FUROSEMIDE 20 MG PO TABS: 20.0000 mg | ORAL_TABLET | Freq: Every day | ORAL | 0 refills | Status: DC

## 2021-02-01 NOTE — Patient Instructions (Signed)
Peripheral Edema  Peripheral edema is swelling that is caused by a buildup of fluid. Peripheral edema most often affects the lower legs, ankles, and feet. It can also develop in the arms, hands, and face. The area of the body that has peripheral edema will look swollen. It may also feel heavy or warm. Your clothes may start to feel tight. Pressing on the area may make a temporary dent in your skin. You may not be able to move your swollen arm or leg as much as usual. There are many causes of peripheral edema. It can happen because of a complication of other conditions such as congestive heart failure, kidney disease, or a problem with your blood circulation. It also can be a side effect of certain medicines or because of an infection. It often happens to women during pregnancy. Sometimes, the cause is not known. Follow these instructions at home: Managing pain, stiffness, and swelling  Raise (elevate) your legs while you are sitting or lying down.  Move around often to prevent stiffness and to lessen swelling.  Do not sit or stand for long periods of time.  Wear support stockings as told by your health care provider.   Medicines  Take over-the-counter and prescription medicines only as told by your health care provider.  Your health care provider may prescribe medicine to help your body get rid of excess water (diuretic). General instructions  Pay attention to any changes in your symptoms.  Follow instructions from your health care provider about limiting salt (sodium) in your diet. Sometimes, eating less salt may reduce swelling.  Moisturize skin daily to help prevent skin from cracking and draining.  Keep all follow-up visits as told by your health care provider. This is important. Contact a health care provider if you have:  A fever.  Edema that starts suddenly or is getting worse, especially if you are pregnant or have a medical condition.  Swelling in only one leg.  Increased  swelling, redness, or pain in one or both of your legs.  Drainage or sores at the area where you have edema. Get help right away if you:  Develop shortness of breath, especially when you are lying down.  Have pain in your chest or abdomen.  Feel weak.  Feel faint. Summary  Peripheral edema is swelling that is caused by a buildup of fluid. Peripheral edema most often affects the lower legs, ankles, and feet.  Move around often to prevent stiffness and to lessen swelling. Do not sit or stand for long periods of time.  Pay attention to any changes in your symptoms.  Contact a health care provider if you have edema that starts suddenly or is getting worse, especially if you are pregnant or have a medical condition.  Get help right away if you develop shortness of breath, especially when lying down. This information is not intended to replace advice given to you by your health care provider. Make sure you discuss any questions you have with your health care provider. Document Revised: 05/08/2018 Document Reviewed: 05/08/2018 Elsevier Patient Education  2021 [employer].

## 2021-02-01 NOTE — Progress Notes (Signed)
Established Patient Office Visit  Subjective:  Patient ID: Kelly Freeman, female    DOB: 01/15/1927  Age: 87 y.o. MRN: [id number]  CC:  Chief Complaint  Patient presents with  . Hypotension    HPI [name] with her granddaughter [name]. She presents for BP follow up. Her BP was low at her visit 4 weeks ago. Her HCTZ was discontinued. She is still received OT and PT through [facility]. She is currenlty taking Keflex for a UTI. She is still having urinary frequency but her behavior has returned to normal. She has been having recurrent UTIs and it has been discussed in the hospital regarding prophylaxis but prophylaxis has not been started. Her hematoma has resolved. She is no longer complaining of pain in her back. She has not had any more falls since her visit. She does have edema in her lower legs. She will not wear TED hose or elevate her legs. She does not report chest pain or seem to have difficulty breathing.   Past Medical History:  Diagnosis Date  . Acute cystitis without hematuria   . Acute diarrhea   . Allergy   . Anxiety   . Blood transfusion without reported diagnosis 1949  . Cancer ([place])    skin cancer on face  . Cataract    bilateral  . Chronic allergic rhinitis   . CKD (chronic kidney disease) stage 3, GFR 30-59 ml/min (HCC)   . Cognitive impairment   . DDD (degenerative disc disease), lumbar   . Dizziness   . E. coli UTI   . Fever   . Gastroenteritis   . GERD (gastroesophageal reflux disease)   . Hereditary and idiopathic peripheral neuropathy   . History of recurrent UTIs   . Hypertension   . Hypertension   . MGUS (monoclonal gammopathy of unknown significance)   . Migraine without aura and without status migrainosus, not intractable   . Mixed incontinence urge and stress   . Moderate episode of recurrent major depressive disorder ([place])   . Nausea and vomiting   . Obesity   . Obstructive sleep apnea   . Osteopenia   . Recurrent  falls   . Restless leg syndrome   . Sleep apnea   . UTI (urinary tract infection)     Past Surgical History:  Procedure Laterality Date  . APPENDECTOMY    . CHOLECYSTECTOMY    . EYE SURGERY     bilateral cataracts removed    Family History  Problem Relation Age of Onset  . Thrombosis Father 68       Coronary  . Stomach cancer Mother 79  . Arthritis Son 29  . Arthritis Daughter 31  . Cirrhosis Daughter 64       Stage 3   . Hepatitis C Daughter     Social History   Socioeconomic History  . Marital status: Widowed    Spouse name: Not on file  . Number of children: 3  . Years of education: 34  . Highest education level: High school graduate  Occupational History  . Not on file  Tobacco Use  . Smoking status: Never Smoker  . Smokeless tobacco: Never Used  Vaping Use  . Vaping Use: Never used  Substance and Sexual Activity  . Alcohol use: Never  . Drug use: Never  . Sexual activity: Not Currently  Other Topics Concern  . Not on file  Social History Narrative   Diet:  Do you drink/ eat things with caffeine?  Yes      Marital status:   Widowed                            What year were you married ? 1948      Do you live in a house, apartment,assistred living, condo, trailer, etc.)? Trailer      Is it one or more stories? One      How many persons live in your home ? 2      Do you have any pets in your home ?(please list) NO      Current or past profession: Office Work      Do you exercise?   Not Much                           Type & how often:       Do you have a living will?       Do you have a DNR form?                       If not, do you want to discuss one?       Do you have signed POA?HPOA forms?                 If so, please bring to your        appointment      Social Determinants of Health   Financial Resource Strain: Not on file  Food Insecurity: Not on file  Transportation Needs: Not on file  Physical Activity: Not on file   Stress: Not on file  Social Connections: Not on file  Intimate Partner Violence: Not on file    Outpatient Medications Prior to Visit  Medication Sig Dispense Refill  . acetaminophen (TYLENOL) 325 MG tablet Take 2 tablets (650 mg total) by mouth every 8 (eight) hours. 540 tablet 1  . cephALEXin (KEFLEX) 500 MG capsule Take 1 capsule (500 mg total) by mouth 2 (two) times daily for 7 days. 14 capsule 0  . diclofenac Sodium (VOLTAREN) 1 % GEL Apply 4 g topically 4 (four) times daily. 350 g 1  . ferrous sulfate 325 (65 FE) MG tablet Take 325 mg by mouth every morning.    Marland Kitchen lisinopril (ZESTRIL) 2.5 MG tablet Take 1 tablet (2.5 mg total) by mouth daily. 30 tablet 3  . LORazepam (ATIVAN) 0.5 MG tablet Take 0.25 mg by mouth every 12 (twelve) hours as needed for anxiety (agitation).    . Melatonin 5 MG TABS Take 5 mg by mouth at bedtime.    . sertraline (ZOLOFT) 20 MG/ML concentrated solution Take 60 mg by mouth every morning.    . zinc oxide (BALMEX) 11.3 % CREA cream Apply 1 application topically 2 (two) times daily. 453 g 1  . lidocaine (LIDODERM) 5 % Place 1 patch onto the skin daily. Remove & Discard patch within 12 hours or as directed by MD 30 patch 0   No facility-administered medications prior to visit.    Allergies  Allergen Reactions  . Levofloxacin Rash  . Gabapentin Other (See Comments)    Other reaction(s): Confusion    ROS Review of Systems As per HPI.    Objective:    Physical Exam Vitals and nursing note reviewed.  Constitutional:      General: She is not  in acute distress.    Appearance: She is not ill-appearing, toxic-appearing or diaphoretic.  HENT:     Head: Normocephalic and atraumatic.  Cardiovascular:     Rate and Rhythm: Normal rate and regular rhythm.     Heart sounds: Normal heart sounds. No murmur heard.   Pulmonary:     Effort: Pulmonary effort is normal. No respiratory distress.     Breath sounds: Normal breath sounds.  Abdominal:     General:  Bowel sounds are normal. There is no distension.     Palpations: Abdomen is soft.     Tenderness: There is no guarding or rebound.  Musculoskeletal:     Right lower leg: 2+ Pitting Edema present.     Left lower leg: 2+ Pitting Edema present.  Skin:    General: Skin is warm and dry.     Capillary Refill: Capillary refill takes less than 2 seconds.  Neurological:     Mental Status: She is alert. Mental status is at baseline.     Motor: Weakness (generalized) present.     Gait: Gait abnormal (wheelchair).     BP 139/68   Pulse 71   Ht 5\' 7"  (1.702 m)   Wt 145 lb (65.8 kg)   BMI 22.71 kg/m  Wt Readings from Last 3 Encounters:  02/01/21 145 lb (65.8 kg)  01/06/21 142 lb 6.7 oz (64.6 kg)  01/04/21 142 lb 8 oz (64.6 kg)     Health Maintenance Due  Topic Date Due  . Pneumococcal Vaccine 39-74 Years old (1 of 4 - PCV13) Never done  . FOOT EXAM  Never done  . OPHTHALMOLOGY EXAM  Never done  . COVID-19 Vaccine (1) Never done  . DEXA SCAN  Never done  . Zoster Vaccines- Shingrix (2 of 2) 07/26/2017  . HEMOGLOBIN A1C  03/21/2020    There are no preventive care reminders to display for this patient.  No results found for: TSH Lab Results  Component Value Date   WBC 10.6 (H) 12/21/2020   HGB 7.4 (L) 12/21/2020   HCT 23.3 (L) 12/21/2020   MCV 93.2 12/21/2020   PLT 160 12/21/2020   Lab Results  Component Value Date   NA 138 12/21/2020   K 4.3 12/21/2020   CO2 27 12/21/2020   GLUCOSE 103 (H) 12/21/2020   BUN 30 (H) 12/21/2020   CREATININE 1.32 (H) 12/21/2020   BILITOT 0.9 05/13/2020   ALKPHOS 67 05/13/2020   AST 19 05/13/2020   ALT 13 05/13/2020   PROT 6.1 (L) 05/13/2020   ALBUMIN 2.8 (L) 05/13/2020   CALCIUM 9.1 12/21/2020   ANIONGAP 9 12/21/2020   No results found for: CHOL No results found for: HDL No results found for: LDLCALC No results found for: TRIG No results found for: [facility] Lab Results  Component Value Date   HGBA1C 5.0 09/22/2019       Assessment & Plan:   Berlene was seen today for hypotension.  Diagnoses and all orders for this visit:  Primary hypertension Well controlled on current regimen. Granddaughter will check BP daily after starting lasix and notify provider of any low readings. Will have [place] check a BNP next week.    Stage 3 chronic kidney disease, unspecified whether stage 3a or 3b CKD ([place]) Will have [place] check a BNP next week.   Recurrent UTI Completed current course of Keflex for current UTI. Then started keflex daily as below for prophylaxis.  -     cephALEXin (KEFLEX) 250 MG  capsule; Take 1 capsule (250 mg total) by mouth daily.  Peripheral edema Will try lasix daily. Notify for side effects or low BP reading. Will have HH check BNP in 1 week.  -     furosemide (LASIX) 20 MG tablet; Take 1 tablet (20 mg total) by mouth daily.  Hematoma Resolved.    Follow-up: Return in about 3 months (around 05/04/2021) for chronic follow up.   The patient indicates understanding of these issues and agrees with the plan.  [name], FNP

## 2021-02-02 ENCOUNTER — Emergency Department (HOSPITAL_COMMUNITY)
Admission: EM | Admit: 2021-02-02 | Discharge: 2021-02-02 | Disposition: A | Discharge status: Home / Self Care | Payer: Medicare Other | Attending: Emergency Medicine | Admitting: Emergency Medicine

## 2021-02-02 ENCOUNTER — Emergency Department (HOSPITAL_COMMUNITY): Payer: Medicare Other

## 2021-02-02 ENCOUNTER — Other Ambulatory Visit: Payer: Self-pay

## 2021-02-02 DIAGNOSIS — W19XXXA Unspecified fall, initial encounter: Secondary | ICD-10-CM | POA: Diagnosis not present

## 2021-02-02 DIAGNOSIS — Z85828 Personal history of other malignant neoplasm of skin: Secondary | ICD-10-CM | POA: Insufficient documentation

## 2021-02-02 DIAGNOSIS — W01198A Fall on same level from slipping, tripping and stumbling with subsequent striking against other object, initial encounter: Secondary | ICD-10-CM | POA: Diagnosis not present

## 2021-02-02 DIAGNOSIS — Z79899 Other long term (current) drug therapy: Secondary | ICD-10-CM | POA: Insufficient documentation

## 2021-02-02 DIAGNOSIS — F0391 Unspecified dementia with behavioral disturbance: Secondary | ICD-10-CM | POA: Insufficient documentation

## 2021-02-02 DIAGNOSIS — N183 Chronic kidney disease, stage 3 unspecified: Secondary | ICD-10-CM | POA: Insufficient documentation

## 2021-02-02 DIAGNOSIS — R519 Headache, unspecified: Secondary | ICD-10-CM | POA: Diagnosis not present

## 2021-02-02 DIAGNOSIS — S81811A Laceration without foreign body, right lower leg, initial encounter: Secondary | ICD-10-CM | POA: Diagnosis not present

## 2021-02-02 DIAGNOSIS — I129 Hypertensive chronic kidney disease with stage 1 through stage 4 chronic kidney disease, or unspecified chronic kidney disease: Secondary | ICD-10-CM | POA: Insufficient documentation

## 2021-02-02 DIAGNOSIS — S8991XA Unspecified injury of right lower leg, initial encounter: Secondary | ICD-10-CM | POA: Diagnosis present

## 2021-02-02 DIAGNOSIS — S0990XA Unspecified injury of head, initial encounter: Secondary | ICD-10-CM | POA: Diagnosis not present

## 2021-02-02 DIAGNOSIS — Z743 Need for continuous supervision: Secondary | ICD-10-CM | POA: Diagnosis not present

## 2021-02-02 DIAGNOSIS — L03115 Cellulitis of right lower limb: Secondary | ICD-10-CM

## 2021-02-02 DIAGNOSIS — R6889 Other general symptoms and signs: Secondary | ICD-10-CM | POA: Diagnosis not present

## 2021-02-02 DIAGNOSIS — Y92129 Unspecified place in nursing home as the place of occurrence of the external cause: Secondary | ICD-10-CM | POA: Insufficient documentation

## 2021-02-02 DIAGNOSIS — R52 Pain, unspecified: Secondary | ICD-10-CM | POA: Diagnosis not present

## 2021-02-02 DIAGNOSIS — M79661 Pain in right lower leg: Secondary | ICD-10-CM | POA: Diagnosis not present

## 2021-02-02 LAB — URINALYSIS, ROUTINE W REFLEX MICROSCOPIC
Bilirubin Urine: NEGATIVE
Glucose, UA: NEGATIVE mg/dL
Hgb urine dipstick: NEGATIVE
Ketones, ur: NEGATIVE mg/dL
Leukocytes,Ua: NEGATIVE
Nitrite: NEGATIVE
Protein, ur: NEGATIVE mg/dL
Specific Gravity, Urine: 1.011 (ref 1.005–1.030)
pH: 7 (ref 5.0–8.0)

## 2021-02-02 MED ORDER — TRAMADOL HCL 50 MG PO TABS
50.0000 mg | ORAL_TABLET | Freq: Once | ORAL | Status: AC
  Administered 2021-02-02: 50 mg via ORAL
  Filled 2021-02-02: qty 1

## 2021-02-02 MED ORDER — CEPHALEXIN 500 MG PO CAPS: 500.0000 mg | ORAL_CAPSULE | Freq: Four times a day (QID) | ORAL | 0 refills | Status: AC

## 2021-02-02 NOTE — ED Provider Notes (Signed)
[facility] EMERGENCY DEPARTMENT Provider Note   CSN: [id number] Arrival date & time: 02/02/21  8841     History Chief Complaint  Patient presents with  . Fall       . Puncture Wound    Kelly Freeman is a 87 y.o. female.  The history is provided by the patient, the nursing home and the EMS personnel. No language interpreter was used.  Fall This is a new problem. The current episode started 1 to 2 hours ago. The problem occurs constantly. The problem has been gradually worsening. Associated symptoms include headaches. Pertinent negatives include no chest pain and no abdominal pain. Nothing aggravates the symptoms. Nothing relieves the symptoms. She has tried nothing for the symptoms.   Pt is reported to have fallen and hit her head.  Pt reports she hurts in the back of her head and her leg.      Past Medical History:  Diagnosis Date  . Acute cystitis without hematuria   . Acute diarrhea   . Allergy   . Anxiety   . Blood transfusion without reported diagnosis 1949  . Cancer ([place])    skin cancer on face  . Cataract    bilateral  . Chronic allergic rhinitis   . CKD (chronic kidney disease) stage 3, GFR 30-59 ml/min (HCC)   . Cognitive impairment   . DDD (degenerative disc disease), lumbar   . Dizziness   . E. coli UTI   . Fever   . Gastroenteritis   . GERD (gastroesophageal reflux disease)   . Hereditary and idiopathic peripheral neuropathy   . History of recurrent UTIs   . Hypertension   . Hypertension   . MGUS (monoclonal gammopathy of unknown significance)   . Migraine without aura and without status migrainosus, not intractable   . Mixed incontinence urge and stress   . Moderate episode of recurrent major depressive disorder ([place])   . Nausea and vomiting   . Obesity   . Obstructive sleep apnea   . Osteopenia   . Recurrent falls   . Restless leg syndrome   . Sleep apnea   . UTI (urinary tract infection)     Patient Active Problem List   Diagnosis Date  Noted  . Recurrent UTI 02/01/2021  . Dementia with behavioral disturbance ([place]) 01/04/2021  . Pain   . Palliative care by specialist   . Goals of care, counseling/discussion   . Hematoma, nontraumatic, soft tissue 12/18/2020  . Fall at home, initial encounter 12/18/2020  . History of DVT (deep vein thrombosis) 12/18/2020  . Recurrent falls   . Moderate episode of recurrent major depressive disorder ([place])   . MGUS (monoclonal gammopathy of unknown significance)   . Primary hypertension   . History of recurrent UTIs   . CKD (chronic kidney disease) stage 3, GFR 30-59 ml/min (HCC)     Past Surgical History:  Procedure Laterality Date  . APPENDECTOMY    . CHOLECYSTECTOMY    . EYE SURGERY     bilateral cataracts removed     OB History   No obstetric history on file.     Family History  Problem Relation Age of Onset  . Thrombosis Father 4       Coronary  . Stomach cancer Mother 77  . Arthritis Son 37  . Arthritis Daughter 38  . Cirrhosis Daughter 13       Stage 3   . Hepatitis C Daughter     Social History  Tobacco Use  . Smoking status: Never Smoker  . Smokeless tobacco: Never Used  Vaping Use  . Vaping Use: Never used  Substance Use Topics  . Alcohol use: Never  . Drug use: Never    Home Medications Prior to Admission medications   Medication Sig Start Date End Date Taking? Authorizing Provider  acetaminophen (TYLENOL) 325 MG tablet Take 2 tablets (650 mg total) by mouth every 8 (eight) hours. 01/04/21  Yes [name], FNP  cephALEXin (KEFLEX) 500 MG capsule Take 1 capsule (500 mg total) by mouth 2 (two) times daily for 7 days. 01/28/21 02/04/21 Yes [name], FNP  diclofenac Sodium (VOLTAREN) 1 % GEL Apply 4 g topically 4 (four) times daily. 01/04/21  Yes [name], FNP  ferrous sulfate 325 (65 FE) MG tablet Take 325 mg by mouth every morning.   Yes [provider]  lidocaine (LIDODERM) 5 % Place 1 patch onto the skin daily.  Remove & Discard patch within 12 hours or as directed by MD 01/12/21  Yes [name], FNP  lisinopril (ZESTRIL) 2.5 MG tablet Take 1 tablet (2.5 mg total) by mouth daily. 09/23/19  Yes Lauree Chandler, NP  LORazepam (ATIVAN) 0.5 MG tablet Take 0.25 mg by mouth every 12 (twelve) hours as needed for anxiety (agitation).   Yes [provider]  Melatonin 5 MG TABS Take 5 mg by mouth at bedtime.   Yes [provider]  sertraline (ZOLOFT) 20 MG/ML concentrated solution Take 60 mg by mouth every morning.   Yes [provider]  valproic acid (DEPAKENE) 250 MG/5ML solution Take 125 mg by mouth in the morning and at bedtime.   Yes [provider]  zinc oxide (BALMEX) 11.3 % CREA cream Apply 1 application topically 2 (two) times daily. 01/19/21  Yes [name], FNP  cephALEXin (KEFLEX) 250 MG capsule Take 1 capsule (250 mg total) by mouth daily. 02/05/21   [name], FNP  furosemide (LASIX) 20 MG tablet Take 1 tablet (20 mg total) by mouth daily. Patient not taking: Reported on 02/02/2021 02/01/21   [name], FNP    Allergies    Levofloxacin and Gabapentin  Review of Systems   Review of Systems  Cardiovascular: Negative for chest pain.  Gastrointestinal: Negative for abdominal pain.  Skin: Positive for wound.  Neurological: Positive for headaches.  All other systems reviewed and are negative.   Physical Exam Updated Vital Signs BP (!) 163/71   Pulse 76   Temp 98.4 F (36.9 C) (Oral)   Resp 18   Ht 5\' 7"  (1.702 m)   Wt 70.3 kg   SpO2 96%   BMI 24.28 kg/m   Physical Exam Vitals reviewed.  HENT:     Head: Normocephalic.     Comments: Tender occipital scalp     Nose: Nose normal.     Mouth/Throat:     Mouth: Mucous membranes are moist.  Eyes:     Extraocular Movements: Extraocular movements intact.     Conjunctiva/sclera: Conjunctivae normal.     Pupils: Pupils are equal, round, and reactive to light.  Cardiovascular:      Rate and Rhythm: Normal rate.     Pulses: Normal pulses.  Pulmonary:     Effort: Pulmonary effort is normal.  Abdominal:     General: Abdomen is flat.  Musculoskeletal:     Cervical back: Normal range of motion.  Skin:    Findings: Erythema present.  Comments: 2.5 cm laceration right lower leg,  tnder to touch  Erythema lower extremity swelling to mid leg   Neurological:     General: No focal deficit present.     Mental Status: She is alert.  Psychiatric:        Mood and Affect: Mood normal.     ED Results / Procedures / Treatments   Labs (all labs ordered are listed, but only abnormal results are displayed) Labs Reviewed - No data to display  EKG None  Radiology No results found.  Procedures Procedures   Medications Ordered in ED Medications - No data to display  ED Course  I have reviewed the triage vital signs and the nursing notes.  Pertinent labs & imaging results that were available during my care of the patient were reviewed by me and considered in my medical decision making (see chart for details).    MDM Rules/Calculators/A&P                          MDM:  Xray tibia  No fracture, pt is on keflex for uti suppression,  Right lower leg shows possible early cellulitis,  I increased keflex.  Wound steri striped.  I advised recheck in 2-3 days.  Pt will need to return to ED if symptoms worsen . Final Clinical Impression(s) / ED Diagnoses Final diagnoses:  Fall, initial encounter  Laceration of right lower extremity excluding thigh, initial encounter  Cellulitis of right leg    Rx / DC Orders ED Discharge Orders         Ordered    cephALEXin (KEFLEX) 500 MG capsule  4 times daily        02/02/21 1109        An After Visit Summary was printed and given to the patient.    Fransico Meadow, Hershal Coria 02/02/21 1125    Noemi Chapel, MD 02/04/21 [id number]

## 2021-02-02 NOTE — Discharge Instructions (Signed)
Return if any problems.  Watch for increased redness and swelling.  Recheck with primary care in 2-3 days

## 2021-02-02 NOTE — ED Notes (Signed)
Steri stripes applied at this time to wound.

## 2021-02-02 NOTE — ED Notes (Signed)
Patient assisted to bedside commode and was able to urinate

## 2021-02-02 NOTE — ED Notes (Signed)
Patient's wound cleansed with baby soap and warm water. Patient also had wound rinsed with sterile water and wrapped with towel until wound closed.

## 2021-02-02 NOTE — ED Notes (Signed)
Patient states she needs to pee and cannot. Patient bladder scanned at this time with urinary retention of >274mL. Sophia, [place] notified

## 2021-02-02 NOTE — ED Triage Notes (Signed)
Patient coming from [place] with complaints of a fall. Patient has hx of dementia and does not remember the fall but states she is having right lower leg pain, bilateral thigh pain and posterior head pain when assessed. Patient has puncture wound to right medial lower leg with bilateral pitting edema. Bleeding controlled in triage.

## 2021-02-03 DIAGNOSIS — W19XXXA Unspecified fall, initial encounter: Secondary | ICD-10-CM | POA: Diagnosis not present

## 2021-02-03 DIAGNOSIS — S50812D Abrasion of left forearm, subsequent encounter: Secondary | ICD-10-CM | POA: Diagnosis not present

## 2021-02-03 DIAGNOSIS — N1831 Chronic kidney disease, stage 3a: Secondary | ICD-10-CM | POA: Diagnosis not present

## 2021-02-03 DIAGNOSIS — D649 Anemia, unspecified: Secondary | ICD-10-CM | POA: Diagnosis not present

## 2021-02-03 DIAGNOSIS — I129 Hypertensive chronic kidney disease with stage 1 through stage 4 chronic kidney disease, or unspecified chronic kidney disease: Secondary | ICD-10-CM | POA: Diagnosis not present

## 2021-02-03 DIAGNOSIS — N390 Urinary tract infection, site not specified: Secondary | ICD-10-CM | POA: Diagnosis not present

## 2021-02-03 DIAGNOSIS — S300XXD Contusion of lower back and pelvis, subsequent encounter: Secondary | ICD-10-CM | POA: Diagnosis not present

## 2021-02-03 DIAGNOSIS — Z86718 Personal history of other venous thrombosis and embolism: Secondary | ICD-10-CM | POA: Diagnosis not present

## 2021-02-03 LAB — URINE CULTURE

## 2021-02-08 DIAGNOSIS — N1831 Chronic kidney disease, stage 3a: Secondary | ICD-10-CM | POA: Diagnosis not present

## 2021-02-08 DIAGNOSIS — W19XXXA Unspecified fall, initial encounter: Secondary | ICD-10-CM | POA: Diagnosis not present

## 2021-02-08 DIAGNOSIS — I129 Hypertensive chronic kidney disease with stage 1 through stage 4 chronic kidney disease, or unspecified chronic kidney disease: Secondary | ICD-10-CM | POA: Diagnosis not present

## 2021-02-08 DIAGNOSIS — D649 Anemia, unspecified: Secondary | ICD-10-CM | POA: Diagnosis not present

## 2021-02-08 DIAGNOSIS — Z86718 Personal history of other venous thrombosis and embolism: Secondary | ICD-10-CM | POA: Diagnosis not present

## 2021-02-08 DIAGNOSIS — N390 Urinary tract infection, site not specified: Secondary | ICD-10-CM | POA: Diagnosis not present

## 2021-02-08 DIAGNOSIS — S300XXD Contusion of lower back and pelvis, subsequent encounter: Secondary | ICD-10-CM | POA: Diagnosis not present

## 2021-02-08 DIAGNOSIS — S50812D Abrasion of left forearm, subsequent encounter: Secondary | ICD-10-CM | POA: Diagnosis not present

## 2021-02-09 ENCOUNTER — Telehealth: Payer: Self-pay | Admitting: Family Medicine

## 2021-02-09 NOTE — Telephone Encounter (Signed)
Daughter aware that she does need to be seen. Daughter will call back and schedule appointment with Tiffany.

## 2021-02-10 ENCOUNTER — Encounter: Payer: Self-pay | Admitting: Family Medicine

## 2021-02-10 ENCOUNTER — Ambulatory Visit (INDEPENDENT_AMBULATORY_CARE_PROVIDER_SITE_OTHER): Payer: Medicare Other | Admitting: Family Medicine

## 2021-02-10 ENCOUNTER — Other Ambulatory Visit: Payer: Self-pay

## 2021-02-10 VITALS — BP 132/72 | HR 63 | Ht 67.0 in | Wt 145.5 lb

## 2021-02-10 DIAGNOSIS — S81811D Laceration without foreign body, right lower leg, subsequent encounter: Secondary | ICD-10-CM

## 2021-02-10 DIAGNOSIS — W19XXXD Unspecified fall, subsequent encounter: Secondary | ICD-10-CM

## 2021-02-10 DIAGNOSIS — R609 Edema, unspecified: Secondary | ICD-10-CM | POA: Diagnosis not present

## 2021-02-10 DIAGNOSIS — R600 Localized edema: Secondary | ICD-10-CM

## 2021-02-10 DIAGNOSIS — Z09 Encounter for follow-up examination after completed treatment for conditions other than malignant neoplasm: Secondary | ICD-10-CM | POA: Diagnosis not present

## 2021-02-10 DIAGNOSIS — L03115 Cellulitis of right lower limb: Secondary | ICD-10-CM

## 2021-02-10 NOTE — Progress Notes (Addendum)
Established Patient Office Visit  Subjective:  Patient ID: Kelly Freeman, female    DOB: September 01, 1926  Age: 87 y.o. MRN: [id number]  CC:  Chief Complaint  Patient presents with   Fall    HPI Kelly Freeman presents with family for hospital discharge follow up after a fall on 02/02/21. She hit her head when she fell and the fall also resulted in a laceration to her right lower leg. She had a negative head CT and Xray of the right lower leg. She was treated in a higher dosage of Keflex for 10 days for possible cellulitis. Her laceration was cleaned and closed with steri strips. The family reports that the wound looks much better. She continues to have swelling in her lower legs. She is not unusual for her but has been worse recently. She will not leave compression stockings or wraps on due to her dementia. She does elevate her legs often. She is having some clear drainage from her right leg. She has not had a fever. She has not had another fall. Denies chest pain or shortness of breath. She stays well hydrated during the day as her granddaughter is will her and encourages her to drink frequently.   Past Medical History:  Diagnosis Date   Acute cystitis without hematuria    Acute diarrhea    Allergy    Anxiety    Blood transfusion without reported diagnosis 1949   Cancer ([place])    skin cancer on face   Cataract    bilateral   Chronic allergic rhinitis    CKD (chronic kidney disease) stage 3, GFR 30-59 ml/min (HCC)    Cognitive impairment    DDD (degenerative disc disease), lumbar    Dizziness    E. coli UTI    Fever    Gastroenteritis    GERD (gastroesophageal reflux disease)    Hereditary and idiopathic peripheral neuropathy    History of recurrent UTIs    Hypertension    Hypertension    MGUS (monoclonal gammopathy of unknown significance)    Migraine without aura and without status migrainosus, not intractable    Mixed incontinence urge and stress    Moderate episode of recurrent  major depressive disorder (HCC)    Nausea and vomiting    Obesity    Obstructive sleep apnea    Osteopenia    Recurrent falls    Restless leg syndrome    Sleep apnea    UTI (urinary tract infection)     Past Surgical History:  Procedure Laterality Date   APPENDECTOMY     CHOLECYSTECTOMY     EYE SURGERY     bilateral cataracts removed    Family History  Problem Relation Age of Onset   Thrombosis Father 59       Coronary   Stomach cancer Mother 7   Arthritis Son 32   Arthritis Daughter 6   Cirrhosis Daughter 72       Stage 3    Hepatitis C Daughter     Social History   Socioeconomic History   Marital status: Widowed    Spouse name: Not on file   Number of children: 3   Years of education: 12   Highest education level: High school graduate  Occupational History   Not on file  Tobacco Use   Smoking status: Never   Smokeless tobacco: Never  Vaping Use   Vaping Use: Never used  Substance and Sexual Activity   Alcohol use: Never  Drug use: Never   Sexual activity: Not Currently  Other Topics Concern   Not on file  Social History Narrative   Diet:       Do you drink/ eat things with caffeine?  Yes      Marital status:   Widowed                            What year were you married ? 1948      Do you live in a house, apartment,assistred living, condo, trailer, etc.)? Trailer      Is it one or more stories? One      How many persons live in your home ? 2      Do you have any pets in your home ?(please list) NO      Current or past profession: Office Work      Do you exercise?   Not Much                           Type & how often:       Do you have a living will?       Do you have a DNR form?                       If not, do you want to discuss one?       Do you have signed POA?HPOA forms?                 If so, please bring to your        appointment      Social Determinants of Health   Financial Resource Strain: Not on file  Food  Insecurity: Not on file  Transportation Needs: Not on file  Physical Activity: Not on file  Stress: Not on file  Social Connections: Not on file  Intimate Partner Violence: Not on file    Outpatient Medications Prior to Visit  Medication Sig Dispense Refill   acetaminophen (TYLENOL) 325 MG tablet Take 2 tablets (650 mg total) by mouth every 8 (eight) hours. 540 tablet 1   cephALEXin (KEFLEX) 500 MG capsule Take 1 capsule (500 mg total) by mouth 4 (four) times daily for 10 days. 40 capsule 0   diclofenac Sodium (VOLTAREN) 1 % GEL Apply 4 g topically 4 (four) times daily. 350 g 1   ferrous sulfate 325 (65 FE) MG tablet Take 325 mg by mouth every morning.     furosemide (LASIX) 20 MG tablet Take 1 tablet (20 mg total) by mouth daily. 30 tablet 0   lidocaine (LIDODERM) 5 % Place 1 patch onto the skin daily. Remove & Discard patch within 12 hours or as directed by MD 30 patch 0   lisinopril (ZESTRIL) 2.5 MG tablet Take 1 tablet (2.5 mg total) by mouth daily. 30 tablet 3   LORazepam (ATIVAN) 0.5 MG tablet Take 0.25 mg by mouth every 12 (twelve) hours as needed for anxiety (agitation).     Melatonin 5 MG TABS Take 5 mg by mouth at bedtime.     sertraline (ZOLOFT) 20 MG/ML concentrated solution Take 60 mg by mouth every morning.     valproic acid (DEPAKENE) 250 MG/5ML solution Take 125 mg by mouth in the morning and at bedtime.     zinc oxide (BALMEX) 11.3 % CREA cream Apply 1 application topically 2 (two)  times daily. 453 g 1   No facility-administered medications prior to visit.    Allergies  Allergen Reactions   Levofloxacin Rash   Gabapentin Other (See Comments)    Other reaction(s): Confusion    ROS Review of Systems As per HPI.    Objective:    Physical Exam Vitals and nursing note reviewed.  Constitutional:      General: She is not in acute distress.    Appearance: She is not ill-appearing or diaphoretic.  Cardiovascular:     Rate and Rhythm: Normal rate and regular  rhythm.     Heart sounds: Normal heart sounds. No murmur heard. Pulmonary:     Effort: Pulmonary effort is normal. No respiratory distress.     Breath sounds: Normal breath sounds.  Musculoskeletal:     Right lower leg: 2+ Pitting Edema present.     Left lower leg: 2+ Pitting Edema present.  Skin:    General: Skin is warm and dry.     Findings: Wound present.       Neurological:     Mental Status: She is alert.     Motor: Weakness (generalized) present.     Gait: Gait abnormal (wheelchair).  Psychiatric:        Behavior: Behavior normal.    BP 132/72   Pulse 63   Ht $R'5\' 7"'Ur$  (1.702 m)   Wt 145 lb 8 oz (66 kg)   BMI 22.79 kg/m  Wt Readings from Last 3 Encounters:  02/10/21 145 lb 8 oz (66 kg)  02/02/21 155 lb (70.3 kg)  02/01/21 145 lb (65.8 kg)     Health Maintenance Due  Topic Date Due   COVID-19 Vaccine (1) Never done    There are no preventive care reminders to display for this patient.  No results found for: TSH Lab Results  Component Value Date   WBC 10.6 (H) 12/21/2020   HGB 7.4 (L) 12/21/2020   HCT 23.3 (L) 12/21/2020   MCV 93.2 12/21/2020   PLT 160 12/21/2020   Lab Results  Component Value Date   NA 138 12/21/2020   K 4.3 12/21/2020   CO2 27 12/21/2020   GLUCOSE 103 (H) 12/21/2020   BUN 30 (H) 12/21/2020   CREATININE 1.32 (H) 12/21/2020   BILITOT 0.9 05/13/2020   ALKPHOS 67 05/13/2020   AST 19 05/13/2020   ALT 13 05/13/2020   PROT 6.1 (L) 05/13/2020   ALBUMIN 2.8 (L) 05/13/2020   CALCIUM 9.1 12/21/2020   ANIONGAP 9 12/21/2020   No results found for: CHOL No results found for: HDL No results found for: LDLCALC No results found for: TRIG No results found for: [facility] Lab Results  Component Value Date   HGBA1C 5.0 09/22/2019      Assessment & Plan:   Kelly Freeman was seen today for fall.  Diagnoses and all orders for this visit:  Fall, subsequent encounter Evaluated in ED, labs pending.  -     BMP8+EGFR -     CBC with  Differential/Platelet  Laceration of right lower extremity, subsequent encounter Healing well, continue stand wound care. On keflex.  -     BMP8+EGFR -     CBC with Differential/Platelet  Cellulitis of right lower extremity Improving. Continue kelex.  -     BMP8+EGFR -     CBC with Differential/Platelet  Peripheral edema Chronic with recent worsening. Increase lasix to 40 mg daily for the next 3 days. Discussed unnaboots, however family reports that she will take  them off as they have been unable to keep any type of wrap or compression to her legs. Elevated as much as possible.  -     BMP8+EGFR -     CBC with Differential/Platelet  Hospital discharge follow-up Reviewed ED record.    Follow-up: Return in about 1 week (around 02/17/2021) for wound recheck. Sooner for new or worsening symptoms.   The patient indicates understanding of these issues and agrees with the plan.    [name], FNP

## 2021-02-10 NOTE — Patient Instructions (Signed)

## 2021-02-11 ENCOUNTER — Telehealth: Payer: Self-pay | Admitting: Family Medicine

## 2021-02-11 DIAGNOSIS — W19XXXA Unspecified fall, initial encounter: Secondary | ICD-10-CM | POA: Diagnosis not present

## 2021-02-11 DIAGNOSIS — S300XXD Contusion of lower back and pelvis, subsequent encounter: Secondary | ICD-10-CM | POA: Diagnosis not present

## 2021-02-11 DIAGNOSIS — S50812D Abrasion of left forearm, subsequent encounter: Secondary | ICD-10-CM | POA: Diagnosis not present

## 2021-02-11 DIAGNOSIS — Z86718 Personal history of other venous thrombosis and embolism: Secondary | ICD-10-CM | POA: Diagnosis not present

## 2021-02-11 DIAGNOSIS — N1831 Chronic kidney disease, stage 3a: Secondary | ICD-10-CM | POA: Diagnosis not present

## 2021-02-11 DIAGNOSIS — I129 Hypertensive chronic kidney disease with stage 1 through stage 4 chronic kidney disease, or unspecified chronic kidney disease: Secondary | ICD-10-CM | POA: Diagnosis not present

## 2021-02-11 DIAGNOSIS — N390 Urinary tract infection, site not specified: Secondary | ICD-10-CM | POA: Diagnosis not present

## 2021-02-11 DIAGNOSIS — D649 Anemia, unspecified: Secondary | ICD-10-CM | POA: Diagnosis not present

## 2021-02-11 LAB — CBC WITH DIFFERENTIAL/PLATELET
Basophils Absolute: 0 10*3/uL (ref 0.0–0.2)
Basos: 1 %
EOS (ABSOLUTE): 0.2 10*3/uL (ref 0.0–0.4)
Eos: 4 %
Hematocrit: 33.7 % — ABNORMAL LOW (ref 34.0–46.6)
Hemoglobin: 11 g/dL — ABNORMAL LOW (ref 11.1–15.9)
Immature Grans (Abs): 0 10*3/uL (ref 0.0–0.1)
Immature Granulocytes: 0 %
Lymphocytes Absolute: 1.2 10*3/uL (ref 0.7–3.1)
Lymphs: 20 %
MCH: 30.6 pg (ref 26.6–33.0)
MCHC: 32.6 g/dL (ref 31.5–35.7)
MCV: 94 fL (ref 79–97)
Monocytes Absolute: 0.6 10*3/uL (ref 0.1–0.9)
Monocytes: 10 %
Neutrophils Absolute: 4 10*3/uL (ref 1.4–7.0)
Neutrophils: 65 %
Platelets: 207 10*3/uL (ref 150–450)
RBC: 3.59 x10E6/uL — ABNORMAL LOW (ref 3.77–5.28)
RDW: 12.3 % (ref 11.7–15.4)
WBC: 6.1 10*3/uL (ref 3.4–10.8)

## 2021-02-11 LAB — BMP8+EGFR
BUN/Creatinine Ratio: 27 (ref 12–28)
BUN: 37 mg/dL — ABNORMAL HIGH (ref 10–36)
CO2: 26 mmol/L (ref 20–29)
Calcium: 9.3 mg/dL (ref 8.7–10.3)
Chloride: 100 mmol/L (ref 96–106)
Creatinine, Ser: 1.39 mg/dL — ABNORMAL HIGH (ref 0.57–1.00)
Glucose: 97 mg/dL (ref 65–99)
Potassium: 5.6 mmol/L — ABNORMAL HIGH (ref 3.5–5.2)
Sodium: 141 mmol/L (ref 134–144)
eGFR: 36 mL/min/{1.73_m2} — ABNORMAL LOW (ref >59)

## 2021-02-11 NOTE — Telephone Encounter (Signed)
Nurse called from [facility] to get verbal order from Marjorie Smolder for pt to get Unnaboot for both legs.  Spoke with Tiffany and she approved. Made nurse aware.

## 2021-02-15 DIAGNOSIS — S300XXD Contusion of lower back and pelvis, subsequent encounter: Secondary | ICD-10-CM | POA: Diagnosis not present

## 2021-02-15 DIAGNOSIS — I129 Hypertensive chronic kidney disease with stage 1 through stage 4 chronic kidney disease, or unspecified chronic kidney disease: Secondary | ICD-10-CM | POA: Diagnosis not present

## 2021-02-15 DIAGNOSIS — W19XXXA Unspecified fall, initial encounter: Secondary | ICD-10-CM | POA: Diagnosis not present

## 2021-02-15 DIAGNOSIS — N1831 Chronic kidney disease, stage 3a: Secondary | ICD-10-CM | POA: Diagnosis not present

## 2021-02-15 DIAGNOSIS — S80811D Abrasion, right lower leg, subsequent encounter: Secondary | ICD-10-CM | POA: Diagnosis not present

## 2021-02-15 DIAGNOSIS — S51802A Unspecified open wound of left forearm, initial encounter: Secondary | ICD-10-CM | POA: Diagnosis not present

## 2021-02-15 DIAGNOSIS — Z86718 Personal history of other venous thrombosis and embolism: Secondary | ICD-10-CM | POA: Diagnosis not present

## 2021-02-15 DIAGNOSIS — S50812D Abrasion of left forearm, subsequent encounter: Secondary | ICD-10-CM | POA: Diagnosis not present

## 2021-02-15 DIAGNOSIS — N390 Urinary tract infection, site not specified: Secondary | ICD-10-CM | POA: Diagnosis not present

## 2021-02-15 DIAGNOSIS — D649 Anemia, unspecified: Secondary | ICD-10-CM | POA: Diagnosis not present

## 2021-02-16 ENCOUNTER — Ambulatory Visit: Payer: Medicare Other | Admitting: Physician Assistant

## 2021-02-17 ENCOUNTER — Other Ambulatory Visit: Payer: Self-pay

## 2021-02-17 ENCOUNTER — Encounter: Payer: Self-pay | Admitting: Physician Assistant

## 2021-02-17 ENCOUNTER — Ambulatory Visit (INDEPENDENT_AMBULATORY_CARE_PROVIDER_SITE_OTHER): Payer: Medicare Other | Admitting: Physician Assistant

## 2021-02-17 VITALS — BP 145/64 | HR 61 | Temp 97.0°F | Resp 20 | Ht 67.0 in | Wt 147.0 lb

## 2021-02-17 DIAGNOSIS — S300XXD Contusion of lower back and pelvis, subsequent encounter: Secondary | ICD-10-CM | POA: Diagnosis not present

## 2021-02-17 DIAGNOSIS — S81811D Laceration without foreign body, right lower leg, subsequent encounter: Secondary | ICD-10-CM | POA: Diagnosis not present

## 2021-02-17 DIAGNOSIS — S50812D Abrasion of left forearm, subsequent encounter: Secondary | ICD-10-CM | POA: Diagnosis not present

## 2021-02-17 DIAGNOSIS — R600 Localized edema: Secondary | ICD-10-CM

## 2021-02-17 DIAGNOSIS — N390 Urinary tract infection, site not specified: Secondary | ICD-10-CM | POA: Diagnosis not present

## 2021-02-17 DIAGNOSIS — N1831 Chronic kidney disease, stage 3a: Secondary | ICD-10-CM | POA: Diagnosis not present

## 2021-02-17 DIAGNOSIS — D649 Anemia, unspecified: Secondary | ICD-10-CM | POA: Diagnosis not present

## 2021-02-17 DIAGNOSIS — Z86718 Personal history of other venous thrombosis and embolism: Secondary | ICD-10-CM | POA: Diagnosis not present

## 2021-02-17 DIAGNOSIS — I129 Hypertensive chronic kidney disease with stage 1 through stage 4 chronic kidney disease, or unspecified chronic kidney disease: Secondary | ICD-10-CM | POA: Diagnosis not present

## 2021-02-17 DIAGNOSIS — W19XXXA Unspecified fall, initial encounter: Secondary | ICD-10-CM | POA: Diagnosis not present

## 2021-02-17 NOTE — Progress Notes (Signed)
  Subjective:     Patient ID: Kelly Freeman, female   DOB: 1927/01/19, 87 y.o.   MRN: [id number]  HPI Pt here for wound check of the R lower leg Pt fell sustaining a skin tear to the leg Currently in Unna boot bilat due to lower ext edema Having these rewrapped twice a week at her [place] [employer] residence  Review of Systems  Constitutional: Negative.   Skin:  Positive for wound. Negative for color change.      Objective:   Physical Exam Vitals and nursing note reviewed.  Constitutional:      General: She is not in acute distress.    Appearance: Normal appearance. She is not ill-appearing or toxic-appearing.  Neurological:     Mental Status: She is alert.  Ambulating well with walker Lower ext bilat in Unna boots below the knee Unwrapped the R leg Some serous drainage on the dressing but no purulent drainage noted Skin tear is healing well No surrounding erythema or induration No purulent drainage to the wound + edema to the R LE from mid tib distal     Assessment:     1. Noninfected skin tear of right lower extremity, subsequent encounter   2. Lower extremity edema        Plan:     Continue with bilat Unna boots Elevation of leg when possible Immed f/u if any changes F/U in 1 week

## 2021-02-17 NOTE — Patient Instructions (Signed)
Skin Tear A skin tear is a wound in which the top layers of skin peel off. This is a common problem for older people. It can also be a problem for people who takecertain medicines for too long. To repair the skin, your doctor may use: Tape. Skin tape (adhesive) strips. A bandage (dressing) may also be placed over the tape or skin tape strips. Follow these instructions at home: Keep your wound clean Clean the wound as told by your doctor. You may be told to keep the wound dry for the first few days. If you are told to clean the wound: Wash the wound with mild soap and water, a wound cleanser, or a salt-water (saline) solution. If you use soap, rinse the wound with water to get all the soap off. Do not rub the wound dry. Pat the wound gently with a clean towel or let it air-dry. Change any bandage as told by your doctor. This includes changing the bandage if it gets wet, gets dirty, or starts to smell bad. To change your bandage: Wash your hands with soap and water for at least 20 seconds before and after you change your bandage. If you cannot use soap and water, use hand sanitizer. Leave tape or skin tape strips alone unless you are told to take them off. You may trim the edges of the tape strips if they curl up. Watch for signs of infection Check your wound every day for signs of infection. Check for: Redness, swelling, or pain. More fluid or blood. Warmth. Pus or a bad smell.  Protect your wound Do not scratch or pick at the wound. Protect the injured area until it has healed. Medicines Take or apply over-the-counter and prescription medicines only as told by your doctor. If you were prescribed an antibiotic medicine, take or apply it as told by your doctor. Do not stop using it even if your condition gets better. General instructions  Keep the bandage dry. Do not take baths, swim, use a hot tub, or do anything that puts your wound underwater. Ask your doctor about taking showers or  sponge baths. Keep all follow-up visits.  Contact a doctor if: You have any of these signs of infection in your wound: Redness, swelling, or pain. More fluid or blood. Warmth. Pus or a bad smell. Get help right away if: You have a red streak that goes away from the skin tear. You have a fever and chills, and your symptoms get worse all of a sudden. Summary A skin tear is a wound in which the top layers of skin peel off. To repair the skin, your doctor may use tape or skin tape strips. Change any bandage as told by your doctor. Take or apply over-the-counter and prescription medicines only as told by your doctor. Contact a doctor if you have signs of infection. This information is not intended to replace advice given to you by your health care provider. Make sure you discuss any questions you have with your healthcare provider. Document Revised: 11/19/2019 Document Reviewed: 11/19/2019 Elsevier Patient Education  [place].

## 2021-02-18 ENCOUNTER — Ambulatory Visit: Payer: Medicare Other | Admitting: Urology

## 2021-02-18 DIAGNOSIS — S51802A Unspecified open wound of left forearm, initial encounter: Secondary | ICD-10-CM | POA: Diagnosis not present

## 2021-02-18 DIAGNOSIS — S80811D Abrasion, right lower leg, subsequent encounter: Secondary | ICD-10-CM | POA: Diagnosis not present

## 2021-02-22 DIAGNOSIS — D649 Anemia, unspecified: Secondary | ICD-10-CM | POA: Diagnosis not present

## 2021-02-22 DIAGNOSIS — S80811D Abrasion, right lower leg, subsequent encounter: Secondary | ICD-10-CM | POA: Diagnosis not present

## 2021-02-22 DIAGNOSIS — Z4800 Encounter for change or removal of nonsurgical wound dressing: Secondary | ICD-10-CM | POA: Diagnosis not present

## 2021-02-22 DIAGNOSIS — N1831 Chronic kidney disease, stage 3a: Secondary | ICD-10-CM | POA: Diagnosis not present

## 2021-02-22 DIAGNOSIS — W19XXXA Unspecified fall, initial encounter: Secondary | ICD-10-CM | POA: Diagnosis not present

## 2021-02-22 DIAGNOSIS — Z86718 Personal history of other venous thrombosis and embolism: Secondary | ICD-10-CM | POA: Diagnosis not present

## 2021-02-22 DIAGNOSIS — I129 Hypertensive chronic kidney disease with stage 1 through stage 4 chronic kidney disease, or unspecified chronic kidney disease: Secondary | ICD-10-CM | POA: Diagnosis not present

## 2021-02-24 ENCOUNTER — Ambulatory Visit (INDEPENDENT_AMBULATORY_CARE_PROVIDER_SITE_OTHER): Payer: Medicare Other | Admitting: Family Medicine

## 2021-02-24 ENCOUNTER — Other Ambulatory Visit: Payer: Self-pay

## 2021-02-24 ENCOUNTER — Encounter: Payer: Self-pay | Admitting: Family Medicine

## 2021-02-24 VITALS — BP 117/57 | HR 55 | Ht 67.0 in | Wt 148.0 lb

## 2021-02-24 DIAGNOSIS — Z86718 Personal history of other venous thrombosis and embolism: Secondary | ICD-10-CM | POA: Diagnosis not present

## 2021-02-24 DIAGNOSIS — S81811D Laceration without foreign body, right lower leg, subsequent encounter: Secondary | ICD-10-CM | POA: Diagnosis not present

## 2021-02-24 DIAGNOSIS — S80811D Abrasion, right lower leg, subsequent encounter: Secondary | ICD-10-CM | POA: Diagnosis not present

## 2021-02-24 DIAGNOSIS — W19XXXA Unspecified fall, initial encounter: Secondary | ICD-10-CM | POA: Diagnosis not present

## 2021-02-24 DIAGNOSIS — E875 Hyperkalemia: Secondary | ICD-10-CM | POA: Diagnosis not present

## 2021-02-24 DIAGNOSIS — Z4800 Encounter for change or removal of nonsurgical wound dressing: Secondary | ICD-10-CM | POA: Diagnosis not present

## 2021-02-24 DIAGNOSIS — R600 Localized edema: Secondary | ICD-10-CM

## 2021-02-24 DIAGNOSIS — D649 Anemia, unspecified: Secondary | ICD-10-CM | POA: Diagnosis not present

## 2021-02-24 DIAGNOSIS — I129 Hypertensive chronic kidney disease with stage 1 through stage 4 chronic kidney disease, or unspecified chronic kidney disease: Secondary | ICD-10-CM | POA: Diagnosis not present

## 2021-02-24 DIAGNOSIS — N1831 Chronic kidney disease, stage 3a: Secondary | ICD-10-CM | POA: Diagnosis not present

## 2021-02-24 NOTE — Patient Instructions (Signed)
Peripheral Edema Peripheral edema is swelling that is caused by a buildup of fluid. Peripheral edema most often affects the lower legs, ankles, and feet. It can also develop in the arms, hands, and face. The area of the body that has peripheral edema will look swollen. It may also feel heavy or warm. Your clothes may start to feel tight. Pressing on the area may make a temporary dent in your skin. You may not be able to move your swollen arm or leg as much as usual. There are many causes of peripheral edema. It can happen because of a complication of other conditions such as congestive heart failure, kidney disease, or a problem with your blood circulation. It also can be a side effect of certain medicines or because of an infection. It often happens to women during pregnancy. Sometimes, the cause is not known. Follow these instructions at home: Managing pain, stiffness, and swelling  Raise (elevate) your legs while you are sitting or lying down. Move around often to prevent stiffness and to lessen swelling. Do not sit or stand for long periods of time. Wear support stockings as told by your health care provider. Medicines Take over-the-counter and prescription medicines only as told by your health care provider. Your health care provider may prescribe medicine to help your body get rid of excess water (diuretic). General instructions Pay attention to any changes in your symptoms. Follow instructions from your health care provider about limiting salt (sodium) in your diet. Sometimes, eating less salt may reduce swelling. Moisturize skin daily to help prevent skin from cracking and draining. Keep all follow-up visits as told by your health care provider. This is important. Contact a health care provider if you have: A fever. Edema that starts suddenly or is getting worse, especially if you are pregnant or have a medical condition. Swelling in only one leg. Increased swelling, redness, or pain in  one or both of your legs. Drainage or sores at the area where you have edema. Get help right away if you: Develop shortness of breath, especially when you are lying down. Have pain in your chest or abdomen. Feel weak. Feel faint. Summary Peripheral edema is swelling that is caused by a buildup of fluid. Peripheral edema most often affects the lower legs, ankles, and feet. Move around often to prevent stiffness and to lessen swelling. Do not sit or stand for long periods of time. Pay attention to any changes in your symptoms. Contact a health care provider if you have edema that starts suddenly or is getting worse, especially if you are pregnant or have a medical condition. Get help right away if you develop shortness of breath, especially when lying down. This information is not intended to replace advice given to you by your health care provider. Make sure you discuss any questions you have with your health care provider. Document Revised: 05/08/2018 Document Reviewed: 05/08/2018 Elsevier Patient Education  2022 [employer]  

## 2021-02-24 NOTE — Progress Notes (Signed)
Established Patient Office Visit  Subjective:  Patient ID: Kelly Freeman, female    DOB: 1927-04-27  Age: 87 y.o. MRN: [id number]  CC:  Chief Complaint  Patient presents with   Wound Check     HPI Kelly Freeman presents for wound recheck. She is here with her family today. She was seen in the office last week for the skin tear on her lower right leg. She had had unna boots reapplied. She has been leaving her unna boots on. They have been check 2x a week at [facility]. Denies fever, chills, drainage, or skin change. Denies chest pain or shortness of breath. They have been trying to elevate her legs.   She is upset about how swollen her legs are. She feels like they are ugly. This makes her feel depressed. Denies SI.   Family is requesting that barrier cream be ordered schedule as it not being regularly used by the staff at [facility] point for skin breakdown to buttocks as it is ordered prn currently.    Depression screen [facility] 2/9 02/17/2021  Decreased Interest 3  Down, Depressed, Hopeless 3  PHQ - 2 Score 6  Altered sleeping 3  Tired, decreased energy 3  Change in appetite 3  Feeling bad or failure about yourself  3  Trouble concentrating 3  Moving slowly or fidgety/restless 3  Suicidal thoughts 0  PHQ-9 Score 24  Difficult doing work/chores Extremely dIfficult   GAD 7 : Generalized Anxiety Score 02/17/2021  Nervous, Anxious, on Edge 3  Control/stop worrying 3  Worry too much - different things 3  Trouble relaxing 3  Restless 3  Easily annoyed or irritable 3  Afraid - awful might happen 3  Total GAD 7 Score 21  Anxiety Difficulty Extremely difficult     Past Medical History:  Diagnosis Date   Acute cystitis without hematuria    Acute diarrhea    Allergy    Anxiety    Blood transfusion without reported diagnosis 1949   Cancer (HCC)    skin cancer on face   Cataract    bilateral   Chronic allergic rhinitis    CKD (chronic kidney disease) stage 3, GFR 30-59 ml/min  (HCC)    Cognitive impairment    DDD (degenerative disc disease), lumbar    Dizziness    E. coli UTI    Fever    Gastroenteritis    GERD (gastroesophageal reflux disease)    Hereditary and idiopathic peripheral neuropathy    History of recurrent UTIs    Hypertension    Hypertension    MGUS (monoclonal gammopathy of unknown significance)    Migraine without aura and without status migrainosus, not intractable    Mixed incontinence urge and stress    Moderate episode of recurrent major depressive disorder (HCC)    Nausea and vomiting    Obesity    Obstructive sleep apnea    Osteopenia    Recurrent falls    Restless leg syndrome    Sleep apnea    UTI (urinary tract infection)     Past Surgical History:  Procedure Laterality Date   APPENDECTOMY     CHOLECYSTECTOMY     EYE SURGERY     bilateral cataracts removed    Family History  Problem Relation Age of Onset   Thrombosis Father 65       Coronary   Stomach cancer Mother 102   Arthritis Son 3   Arthritis Daughter 56   Cirrhosis Daughter 50  Stage 3    Hepatitis C Daughter     Social History   Socioeconomic History   Marital status: Widowed    Spouse name: Not on file   Number of children: 3   Years of education: 12   Highest education level: High school graduate  Occupational History   Not on file  Tobacco Use   Smoking status: Never   Smokeless tobacco: Never  Vaping Use   Vaping Use: Never used  Substance and Sexual Activity   Alcohol use: Never   Drug use: Never   Sexual activity: Not Currently  Other Topics Concern   Not on file  Social History Narrative   Diet:       Do you drink/ eat things with caffeine?  Yes      Marital status:   Widowed                            What year were you married ? 1948      Do you live in a house, apartment,assistred living, condo, trailer, etc.)? Trailer      Is it one or more stories? One      How many persons live in your home ? 2      Do you have  any pets in your home ?(please list) NO      Current or past profession: Office Work      Do you exercise?   Not Much                           Type & how often:       Do you have a living will?       Do you have a DNR form?                       If not, do you want to discuss one?       Do you have signed POA?HPOA forms?                 If so, please bring to your        appointment      Social Determinants of Health   Financial Resource Strain: Not on file  Food Insecurity: Not on file  Transportation Needs: Not on file  Physical Activity: Not on file  Stress: Not on file  Social Connections: Not on file  Intimate Partner Violence: Not on file    Outpatient Medications Prior to Visit  Medication Sig Dispense Refill   acetaminophen (TYLENOL) 325 MG tablet Take 2 tablets (650 mg total) by mouth every 8 (eight) hours. 540 tablet 1   cephALEXin (KEFLEX) 250 MG capsule Take 250 mg by mouth daily.     diclofenac Sodium (VOLTAREN) 1 % GEL Apply 4 g topically 4 (four) times daily. 350 g 1   ferrous sulfate 325 (65 FE) MG tablet Take 325 mg by mouth every morning.     furosemide (LASIX) 20 MG tablet Take 1 tablet (20 mg total) by mouth daily. 30 tablet 0   lidocaine (LIDODERM) 5 % Place 1 patch onto the skin daily. Remove & Discard patch within 12 hours or as directed by MD 30 patch 0   lisinopril (ZESTRIL) 2.5 MG tablet Take 1 tablet (2.5 mg total) by mouth daily. 30 tablet 3   LORazepam (ATIVAN) 0.5 MG tablet Take  0.25 mg by mouth every 12 (twelve) hours as needed for anxiety (agitation).     Melatonin 5 MG TABS Take 5 mg by mouth at bedtime.     sertraline (ZOLOFT) 20 MG/ML concentrated solution Take 60 mg by mouth every morning.     valproic acid (DEPAKENE) 250 MG/5ML solution Take 125 mg by mouth in the morning and at bedtime.     zinc oxide (BALMEX) 11.3 % CREA cream Apply 1 application topically 2 (two) times daily. 453 g 1   No facility-administered medications prior to  visit.    Allergies  Allergen Reactions   Levofloxacin Rash   Gabapentin Other (See Comments)    Other reaction(s): Confusion    ROS Review of Systems As per HPI.    Objective:    Physical Exam Vitals and nursing note reviewed.  Constitutional:      General: She is not in acute distress.    Appearance: She is not ill-appearing, toxic-appearing or diaphoretic.  Cardiovascular:     Rate and Rhythm: Normal rate.  Pulmonary:     Effort: Pulmonary effort is normal. No respiratory distress.  Musculoskeletal:     Right lower leg: 1+ Pitting Edema present.     Left lower leg: 1+ Pitting Edema present.  Skin:    General: Skin is warm and dry.     Findings: No erythema or rash.     Comments: Well healing skin tear on posterior right lower extremity. No drainage, erythema, or warmth.   Neurological:     Mental Status: She is alert. Mental status is at baseline.  Psychiatric:        Mood and Affect: Mood normal.        Behavior: Behavior normal.    BP (!) 117/57   Pulse (!) 55   Ht 5\' 7"  (1.702 m)   Wt 148 lb (67.1 kg)   BMI 23.18 kg/m  Wt Readings from Last 3 Encounters:  02/24/21 148 lb (67.1 kg)  02/17/21 147 lb (66.7 kg)  02/10/21 145 lb 8 oz (66 kg)     Health Maintenance Due  Topic Date Due   COVID-19 Vaccine (1) Never done    There are no preventive care reminders to display for this patient.  No results found for: TSH Lab Results  Component Value Date   WBC 6.1 02/10/2021   HGB 11.0 (L) 02/10/2021   HCT 33.7 (L) 02/10/2021   MCV 94 02/10/2021   PLT 207 02/10/2021   Lab Results  Component Value Date   NA 141 02/10/2021   K 5.6 (H) 02/10/2021   CO2 26 02/10/2021   GLUCOSE 97 02/10/2021   BUN 37 (H) 02/10/2021   CREATININE 1.39 (H) 02/10/2021   BILITOT 0.9 05/13/2020   ALKPHOS 67 05/13/2020   AST 19 05/13/2020   ALT 13 05/13/2020   PROT 6.1 (L) 05/13/2020   ALBUMIN 2.8 (L) 05/13/2020   CALCIUM 9.3 02/10/2021   ANIONGAP 9 12/21/2020   EGFR  36 (L) 02/10/2021   No results found for: CHOL No results found for: HDL No results found for: LDLCALC No results found for: TRIG No results found for: [facility] Lab Results  Component Value Date   HGBA1C 5.0 09/22/2019      Assessment & Plan:   Kelly Freeman was seen today for wound check.  Diagnoses and all orders for this visit:  Noninfected skin tear of right lower extremity, subsequent encounter Healing well. No signs of infection. Unna boots reapplied today.  Lower extremity edema Improving. Unna boots reapplied bilaterally today. Elevate when possible.   Hyperkalemia BMP pending.  -     BMP8+EGFR   Follow-up: Return in about 1 week (around 03/03/2021) for wound recheck. Sooner for new or worsening symptoms.  The patient indicates understanding of these issues and agrees with the plan.   [name], FNP

## 2021-02-25 LAB — BMP8+EGFR
BUN/Creatinine Ratio: 28 (ref 12–28)
BUN: 37 mg/dL — ABNORMAL HIGH (ref 10–36)
CO2: 21 mmol/L (ref 20–29)
Calcium: 9.4 mg/dL (ref 8.7–10.3)
Chloride: 104 mmol/L (ref 96–106)
Creatinine, Ser: 1.33 mg/dL — ABNORMAL HIGH (ref 0.57–1.00)
Glucose: 116 mg/dL — ABNORMAL HIGH (ref 65–99)
Potassium: 5.4 mmol/L — ABNORMAL HIGH (ref 3.5–5.2)
Sodium: 144 mmol/L (ref 134–144)
eGFR: 38 mL/min/{1.73_m2} — ABNORMAL LOW (ref >59)

## 2021-03-02 ENCOUNTER — Other Ambulatory Visit: Payer: Self-pay

## 2021-03-02 ENCOUNTER — Encounter: Payer: Self-pay | Admitting: Family Medicine

## 2021-03-02 ENCOUNTER — Ambulatory Visit (INDEPENDENT_AMBULATORY_CARE_PROVIDER_SITE_OTHER): Payer: Medicare Other | Admitting: Family Medicine

## 2021-03-02 VITALS — BP 148/83 | HR 61 | Ht 67.0 in | Wt 142.0 lb

## 2021-03-02 DIAGNOSIS — S80811D Abrasion, right lower leg, subsequent encounter: Secondary | ICD-10-CM | POA: Diagnosis not present

## 2021-03-02 DIAGNOSIS — R600 Localized edema: Secondary | ICD-10-CM | POA: Diagnosis not present

## 2021-03-02 DIAGNOSIS — S81811D Laceration without foreign body, right lower leg, subsequent encounter: Secondary | ICD-10-CM | POA: Diagnosis not present

## 2021-03-02 DIAGNOSIS — N1831 Chronic kidney disease, stage 3a: Secondary | ICD-10-CM | POA: Diagnosis not present

## 2021-03-02 DIAGNOSIS — W19XXXA Unspecified fall, initial encounter: Secondary | ICD-10-CM | POA: Diagnosis not present

## 2021-03-02 DIAGNOSIS — E875 Hyperkalemia: Secondary | ICD-10-CM | POA: Diagnosis not present

## 2021-03-02 DIAGNOSIS — Z4800 Encounter for change or removal of nonsurgical wound dressing: Secondary | ICD-10-CM | POA: Diagnosis not present

## 2021-03-02 DIAGNOSIS — D649 Anemia, unspecified: Secondary | ICD-10-CM | POA: Diagnosis not present

## 2021-03-02 DIAGNOSIS — I129 Hypertensive chronic kidney disease with stage 1 through stage 4 chronic kidney disease, or unspecified chronic kidney disease: Secondary | ICD-10-CM | POA: Diagnosis not present

## 2021-03-02 DIAGNOSIS — Z86718 Personal history of other venous thrombosis and embolism: Secondary | ICD-10-CM | POA: Diagnosis not present

## 2021-03-02 NOTE — Patient Instructions (Signed)

## 2021-03-02 NOTE — Progress Notes (Signed)
Established Patient Office Visit  Subjective:  Patient ID: Kelly Freeman, female    DOB: [birth year]  Age: 87 y.o. MRN: [id number]  CC:  Chief Complaint  Patient presents with   Edema    HPI Kelly Freeman presents for follow up of edema. She is here with family. She had new unna boots applied last week at her visit. She felt a significant difference by the next day. She reports that her legs do not hurt any longer and do not feel tight. Denies symptoms of infection. Denies chest pain or shortness of breath.    Past Medical History:  Diagnosis Date   Acute cystitis without hematuria    Acute diarrhea    Allergy    Anxiety    Blood transfusion without reported diagnosis 1949   Cancer ([place])    skin cancer on face   Cataract    bilateral   Chronic allergic rhinitis    CKD (chronic kidney disease) stage 3, GFR 30-59 ml/min (HCC)    Cognitive impairment    DDD (degenerative disc disease), lumbar    Dizziness    E. coli UTI    Fever    Gastroenteritis    GERD (gastroesophageal reflux disease)    Hereditary and idiopathic peripheral neuropathy    History of recurrent UTIs    Hypertension    Hypertension    MGUS (monoclonal gammopathy of unknown significance)    Migraine without aura and without status migrainosus, not intractable    Mixed incontinence urge and stress    Moderate episode of recurrent major depressive disorder (HCC)    Nausea and vomiting    Obesity    Obstructive sleep apnea    Osteopenia    Recurrent falls    Restless leg syndrome    Sleep apnea    UTI (urinary tract infection)     Past Surgical History:  Procedure Laterality Date   APPENDECTOMY     CHOLECYSTECTOMY     EYE SURGERY     bilateral cataracts removed    Family History  Problem Relation Age of Onset   Thrombosis Father 61       Coronary   Stomach cancer Mother 58   Arthritis Son 63   Arthritis Daughter 43   Cirrhosis Daughter 79       Stage 3    Hepatitis C Daughter      Social History   Socioeconomic History   Marital status: Widowed    Spouse name: Not on file   Number of children: 3   Years of education: 12   Highest education level: High school graduate  Occupational History   Not on file  Tobacco Use   Smoking status: Never   Smokeless tobacco: Never  Vaping Use   Vaping Use: Never used  Substance and Sexual Activity   Alcohol use: Never   Drug use: Never   Sexual activity: Not Currently  Other Topics Concern   Not on file  Social History Narrative   Diet:       Do you drink/ eat things with caffeine?  Yes      Marital status:   Widowed                            What year were you married ? 1948      Do you live in a house, apartment,assistred living, condo, trailer, etc.)? Trailer      Is it  one or more stories? One      How many persons live in your home ? 2      Do you have any pets in your home ?(please list) NO      Current or past profession: Office Work      Do you exercise?   Not Much                           Type & how often:       Do you have a living will?       Do you have a DNR form?                       If not, do you want to discuss one?       Do you have signed POA?HPOA forms?                 If so, please bring to your        appointment      Social Determinants of Health   Financial Resource Strain: Not on file  Food Insecurity: Not on file  Transportation Needs: Not on file  Physical Activity: Not on file  Stress: Not on file  Social Connections: Not on file  Intimate Partner Violence: Not on file    Outpatient Medications Prior to Visit  Medication Sig Dispense Refill   acetaminophen (TYLENOL) 325 MG tablet Take 2 tablets (650 mg total) by mouth every 8 (eight) hours. 540 tablet 1   cephALEXin (KEFLEX) 250 MG capsule Take 250 mg by mouth daily.     diclofenac Sodium (VOLTAREN) 1 % GEL Apply 4 g topically 4 (four) times daily. 350 g 1   ferrous sulfate 325 (65 FE) MG tablet Take 325 mg  by mouth every morning.     furosemide (LASIX) 20 MG tablet Take 1 tablet (20 mg total) by mouth daily. 30 tablet 0   lidocaine (LIDODERM) 5 % Place 1 patch onto the skin daily. Remove & Discard patch within 12 hours or as directed by MD 30 patch 0   lisinopril (ZESTRIL) 2.5 MG tablet Take 1 tablet (2.5 mg total) by mouth daily. 30 tablet 3   LORazepam (ATIVAN) 0.5 MG tablet Take 0.25 mg by mouth every 12 (twelve) hours as needed for anxiety (agitation).     Melatonin 5 MG TABS Take 5 mg by mouth at bedtime.     sertraline (ZOLOFT) 20 MG/ML concentrated solution Take 60 mg by mouth every morning.     valproic acid (DEPAKENE) 250 MG/5ML solution Take 125 mg by mouth in the morning and at bedtime.     zinc oxide (BALMEX) 11.3 % CREA cream Apply 1 application topically 2 (two) times daily. 453 g 1   No facility-administered medications prior to visit.    Allergies  Allergen Reactions   Levofloxacin Rash   Gabapentin Other (See Comments)    Other reaction(s): Confusion    ROS Review of Systems As per HPI.    Objective:    Physical Exam Vitals and nursing note reviewed.  Constitutional:      General: She is not in acute distress.    Appearance: She is not ill-appearing, toxic-appearing or diaphoretic.  Cardiovascular:     Rate and Rhythm: Normal rate and regular rhythm.     Heart sounds: Normal heart sounds. No murmur heard. Pulmonary:     Effort: Pulmonary  effort is normal. No respiratory distress.     Breath sounds: Normal breath sounds.  Musculoskeletal:     Right lower leg: No edema.     Left lower leg: No edema.  Skin:    General: Skin is warm and dry.     Comments: Well healing skin tear to posterior right lower leg. No signs of infection. No drainage.   Neurological:     Mental Status: She is alert. Mental status is at baseline.  Psychiatric:        Mood and Affect: Mood normal.        Behavior: Behavior normal.    BP (!) 148/83   Pulse 61   Ht _0  (1.702 m)    Wt 142 lb (64.4 kg)   BMI 22.24 kg/m  Wt Readings from Last 3 Encounters:  03/02/21 142 lb (64.4 kg)  02/24/21 148 lb (67.1 kg)  02/17/21 147 lb (66.7 kg)     Health Maintenance Due  Topic Date Due   COVID-19 Vaccine (1) Never done    There are no preventive care reminders to display for this patient.  No results found for: TSH Lab Results  Component Value Date   WBC 6.1 02/10/2021   HGB 11.0 (L) 02/10/2021   HCT 33.7 (L) 02/10/2021   MCV 94 02/10/2021   PLT 207 02/10/2021   Lab Results  Component Value Date   NA 144 02/24/2021   K 5.4 (H) 02/24/2021   CO2 21 02/24/2021   GLUCOSE 116 (H) 02/24/2021   BUN 37 (H) 02/24/2021   CREATININE 1.33 (H) 02/24/2021   BILITOT 0.9 05/13/2020   ALKPHOS 67 05/13/2020   AST 19 05/13/2020   ALT 13 05/13/2020   PROT 6.1 (L) 05/13/2020   ALBUMIN 2.8 (L) 05/13/2020   CALCIUM 9.4 02/24/2021   ANIONGAP 9 12/21/2020   EGFR 38 (L) 02/24/2021   No results found for: CHOL No results found for: HDL No results found for: LDLCALC No results found for: TRIG No results found for: [facility] Lab Results  Component Value Date   HGBA1C 5.0 09/22/2019      Assessment & Plan:   Amani was seen today for edema.  Diagnoses and all orders for this visit:  Noninfected skin tear of right lower extremity, subsequent encounter Healing well, no signs of infection. Continue standard wound care as needed.   Lower extremity edema Improved. Unna boots not reapplied today. Wear compression stocking. Elevate legs.   Hyperkalemia Repeat labs as below today.  -     BMP8+EGFR    Follow-up: Keep scheduled follow up on 05/04/21, sooner for new or worsening symptoms.   The patient indicates understanding of these issues and agrees with the plan.    [name], FNP

## 2021-03-03 LAB — BMP8+EGFR
BUN/Creatinine Ratio: 23 (ref 12–28)
BUN: 28 mg/dL (ref 10–36)
CO2: 25 mmol/L (ref 20–29)
Calcium: 9.4 mg/dL (ref 8.7–10.3)
Chloride: 104 mmol/L (ref 96–106)
Creatinine, Ser: 1.21 mg/dL — ABNORMAL HIGH (ref 0.57–1.00)
Glucose: 86 mg/dL (ref 65–99)
Potassium: 5 mmol/L (ref 3.5–5.2)
Sodium: 143 mmol/L (ref 134–144)
eGFR: 42 mL/min/{1.73_m2} — ABNORMAL LOW (ref >59)

## 2021-03-04 ENCOUNTER — Encounter: Payer: Self-pay | Admitting: Family Medicine

## 2021-03-04 ENCOUNTER — Telehealth (INDEPENDENT_AMBULATORY_CARE_PROVIDER_SITE_OTHER): Payer: Medicare Other | Admitting: Family Medicine

## 2021-03-04 DIAGNOSIS — N1831 Chronic kidney disease, stage 3a: Secondary | ICD-10-CM | POA: Diagnosis not present

## 2021-03-04 DIAGNOSIS — W19XXXA Unspecified fall, initial encounter: Secondary | ICD-10-CM | POA: Diagnosis not present

## 2021-03-04 DIAGNOSIS — Z4800 Encounter for change or removal of nonsurgical wound dressing: Secondary | ICD-10-CM | POA: Diagnosis not present

## 2021-03-04 DIAGNOSIS — D649 Anemia, unspecified: Secondary | ICD-10-CM | POA: Diagnosis not present

## 2021-03-04 DIAGNOSIS — R0981 Nasal congestion: Secondary | ICD-10-CM

## 2021-03-04 DIAGNOSIS — S80811D Abrasion, right lower leg, subsequent encounter: Secondary | ICD-10-CM | POA: Diagnosis not present

## 2021-03-04 DIAGNOSIS — I129 Hypertensive chronic kidney disease with stage 1 through stage 4 chronic kidney disease, or unspecified chronic kidney disease: Secondary | ICD-10-CM | POA: Diagnosis not present

## 2021-03-04 DIAGNOSIS — Z86718 Personal history of other venous thrombosis and embolism: Secondary | ICD-10-CM | POA: Diagnosis not present

## 2021-03-04 MED ORDER — CETIRIZINE HCL 10 MG PO TABS: 5.0000 mg | ORAL_TABLET | Freq: Every day | ORAL | 5 refills | Status: DC

## 2021-03-04 NOTE — Progress Notes (Signed)
   Virtual Visit  Note Due to COVID-19 pandemic this visit was conducted virtually. This visit type was conducted due to national recommendations for restrictions regarding the COVID-19 Pandemic (e.g. social distancing, sheltering in place) in an effort to limit this patient's exposure and mitigate transmission in our community. All issues noted in this document were discussed and addressed.  A physical exam was not performed with this format.  I connected with Kelly Freeman on 03/04/21 at 1129 by telephone and verified that I am speaking with the correct person using two identifiers. Kelly Freeman is currently located at [facility] and her granddaughter is currently with her during the visit. The provider, [name], FNP is located in their office at time of visit.  I discussed the limitations, risks, security and privacy concerns of performing an evaluation and management service by telephone and the availability of in person appointments. I also discussed with the patient that there may be a patient responsible charge related to this service. The patient expressed understanding and agreed to proceed.  CC: nasal congestion  History and Present Illness:  HPI History was provided by Kelly Freeman's granddaughter today. Kelly Freeman has had some nasal congestion for 1 day. Denies cough, fever, chills, sore throat, shortness of breath, chest pain, or cough. There were some staff members at [facility] that tested positive for Covid, but these staff members were on another unit and did not have contact with Kelly Freeman. Kelly Freeman has been vaccinated and had a booster. She gets ABH gel daily which has benadryl in it.    ROS As per HPI.   Observations/Objective: Deferred for telephone visit.   Assessment and Plan: Alisen was seen today for nasal congestion.  Diagnoses and all orders for this visit:  Nasal congestion Declined Covid testing today as she did not have direct exposure. Try zyrtec as below. Return to  office for new or worsening symptoms. -     cetirizine (ZYRTEC ALLERGY) 10 MG tablet; Take 0.5 tablets (5 mg total) by mouth daily.    Follow Up Instructions: As needed.     I discussed the assessment and treatment plan with the patient. The patient was provided an opportunity to ask questions and all were answered. The patient agreed with the plan and demonstrated an understanding of the instructions.   The patient was advised to call back or seek an in-person evaluation if the symptoms worsen or if the condition fails to improve as anticipated.  The above assessment and management plan was discussed with the patient. The patient verbalized understanding of and has agreed to the management plan. Patient is aware to call the clinic if symptoms persist or worsen. Patient is aware when to return to the clinic for a follow-up visit. Patient educated on when it is appropriate to go to the emergency department.   Time call ended:  1140  I provided 11 minutes of  non face-to-face time during this encounter.    [name], FNP

## 2021-03-07 DIAGNOSIS — N1831 Chronic kidney disease, stage 3a: Secondary | ICD-10-CM | POA: Diagnosis not present

## 2021-03-07 DIAGNOSIS — S80811D Abrasion, right lower leg, subsequent encounter: Secondary | ICD-10-CM | POA: Diagnosis not present

## 2021-03-07 DIAGNOSIS — Z86718 Personal history of other venous thrombosis and embolism: Secondary | ICD-10-CM | POA: Diagnosis not present

## 2021-03-07 DIAGNOSIS — Z4800 Encounter for change or removal of nonsurgical wound dressing: Secondary | ICD-10-CM | POA: Diagnosis not present

## 2021-03-07 DIAGNOSIS — D649 Anemia, unspecified: Secondary | ICD-10-CM | POA: Diagnosis not present

## 2021-03-07 DIAGNOSIS — W19XXXA Unspecified fall, initial encounter: Secondary | ICD-10-CM | POA: Diagnosis not present

## 2021-03-07 DIAGNOSIS — I129 Hypertensive chronic kidney disease with stage 1 through stage 4 chronic kidney disease, or unspecified chronic kidney disease: Secondary | ICD-10-CM | POA: Diagnosis not present

## 2021-03-18 DIAGNOSIS — Z86718 Personal history of other venous thrombosis and embolism: Secondary | ICD-10-CM | POA: Diagnosis not present

## 2021-03-18 DIAGNOSIS — S80811D Abrasion, right lower leg, subsequent encounter: Secondary | ICD-10-CM | POA: Diagnosis not present

## 2021-03-18 DIAGNOSIS — Z4800 Encounter for change or removal of nonsurgical wound dressing: Secondary | ICD-10-CM | POA: Diagnosis not present

## 2021-03-18 DIAGNOSIS — I129 Hypertensive chronic kidney disease with stage 1 through stage 4 chronic kidney disease, or unspecified chronic kidney disease: Secondary | ICD-10-CM | POA: Diagnosis not present

## 2021-03-18 DIAGNOSIS — N1831 Chronic kidney disease, stage 3a: Secondary | ICD-10-CM | POA: Diagnosis not present

## 2021-03-18 DIAGNOSIS — D649 Anemia, unspecified: Secondary | ICD-10-CM | POA: Diagnosis not present

## 2021-03-18 DIAGNOSIS — W19XXXA Unspecified fall, initial encounter: Secondary | ICD-10-CM | POA: Diagnosis not present

## 2021-03-29 ENCOUNTER — Encounter: Payer: Self-pay | Admitting: Family Medicine

## 2021-03-29 ENCOUNTER — Other Ambulatory Visit: Payer: Self-pay

## 2021-03-29 ENCOUNTER — Ambulatory Visit (INDEPENDENT_AMBULATORY_CARE_PROVIDER_SITE_OTHER): Payer: Medicare Other | Admitting: Family Medicine

## 2021-03-29 VITALS — BP 122/62 | HR 64 | Temp 98.0°F | Resp 20

## 2021-03-29 DIAGNOSIS — R609 Edema, unspecified: Secondary | ICD-10-CM | POA: Diagnosis not present

## 2021-03-29 DIAGNOSIS — H6121 Impacted cerumen, right ear: Secondary | ICD-10-CM | POA: Diagnosis not present

## 2021-03-29 NOTE — Progress Notes (Signed)
Acute Office Visit  Subjective:    Patient ID: Kelly Freeman, female    DOB: 01-10-1927, 87 y.o.   MRN: [id number]  Chief Complaint  Patient presents with   Cerumen Impaction         HPI Kelly Freeman is here with family today.  Patient is in today for cerumen impaction. They have noticed large chunks of ear wax from her ears. They are unsure of if her hearing has been decreased or not. Denies fever or pain. They would like her ears flushed.   She also has been having increased edema in both lower legs for the last few days. Family reports that staff have not been applying compression stockings. Denies fever, warmth, erythema, wound, shortness of breath, or chest pain.   Past Medical History:  Diagnosis Date   Acute cystitis without hematuria    Acute diarrhea    Allergy    Anxiety    Blood transfusion without reported diagnosis 1949   Cancer ([place])    skin cancer on face   Cataract    bilateral   Chronic allergic rhinitis    CKD (chronic kidney disease) stage 3, GFR 30-59 ml/min (HCC)    Cognitive impairment    DDD (degenerative disc disease), lumbar    Dizziness    E. coli UTI    Fever    Gastroenteritis    GERD (gastroesophageal reflux disease)    Hereditary and idiopathic peripheral neuropathy    History of recurrent UTIs    Hypertension    Hypertension    MGUS (monoclonal gammopathy of unknown significance)    Migraine without aura and without status migrainosus, not intractable    Mixed incontinence urge and stress    Moderate episode of recurrent major depressive disorder (HCC)    Nausea and vomiting    Obesity    Obstructive sleep apnea    Osteopenia    Recurrent falls    Restless leg syndrome    Sleep apnea    UTI (urinary tract infection)     Past Surgical History:  Procedure Laterality Date   APPENDECTOMY     CHOLECYSTECTOMY     EYE SURGERY     bilateral cataracts removed    Family History  Problem Relation Age of Onset   Thrombosis Father 83        Coronary   Stomach cancer Mother 96   Arthritis Son 18   Arthritis Daughter 32   Cirrhosis Daughter 3       Stage 3    Hepatitis C Daughter     Social History   Socioeconomic History   Marital status: Widowed    Spouse name: Not on file   Number of children: 3   Years of education: 12   Highest education level: High school graduate  Occupational History   Not on file  Tobacco Use   Smoking status: Never   Smokeless tobacco: Never  Vaping Use   Vaping Use: Never used  Substance and Sexual Activity   Alcohol use: Never   Drug use: Never   Sexual activity: Not Currently  Other Topics Concern   Not on file  Social History Narrative   Diet:       Do you drink/ eat things with caffeine?  Yes      Marital status:   Widowed  What year were you married ? 1948      Do you live in a house, apartment,assistred living, condo, trailer, etc.)? Trailer      Is it one or more stories? One      How many persons live in your home ? 2      Do you have any pets in your home ?(please list) NO      Current or past profession: Office Work      Do you exercise?   Not Much                           Type & how often:       Do you have a living will?       Do you have a DNR form?                       If not, do you want to discuss one?       Do you have signed POA?HPOA forms?                 If so, please bring to your        appointment      Social Determinants of Health   Financial Resource Strain: Not on file  Food Insecurity: Not on file  Transportation Needs: Not on file  Physical Activity: Not on file  Stress: Not on file  Social Connections: Not on file  Intimate Partner Violence: Not on file    Outpatient Medications Prior to Visit  Medication Sig Dispense Refill   acetaminophen (TYLENOL) 325 MG tablet Take 2 tablets (650 mg total) by mouth every 8 (eight) hours. 540 tablet 1   cephALEXin (KEFLEX) 250 MG capsule Take 250 mg by mouth  daily.     cetirizine (ZYRTEC ALLERGY) 10 MG tablet Take 0.5 tablets (5 mg total) by mouth daily. 30 tablet 5   ferrous sulfate 325 (65 FE) MG tablet Take 325 mg by mouth every morning.     furosemide (LASIX) 20 MG tablet Take 1 tablet (20 mg total) by mouth daily. 30 tablet 0   haloperidol (HALDOL) 2 MG/ML solution Take 2 mg by mouth 2 (two) times daily as needed for agitation.     lidocaine (LIDODERM) 5 % Place 1 patch onto the skin daily. Remove & Discard patch within 12 hours or as directed by MD 30 patch 0   lisinopril (ZESTRIL) 2.5 MG tablet Take 1 tablet (2.5 mg total) by mouth daily. 30 tablet 3   LORazepam (ATIVAN) 0.5 MG tablet Take 0.25 mg by mouth every 12 (twelve) hours as needed for anxiety (agitation).     sertraline (ZOLOFT) 20 MG/ML concentrated solution Take 60 mg by mouth every morning.     valproic acid (DEPAKENE) 250 MG/5ML solution Take 125 mg by mouth in the morning and at bedtime.     diclofenac Sodium (VOLTAREN) 1 % GEL Apply 4 g topically 4 (four) times daily. (Patient not taking: Reported on 03/29/2021) 350 g 1   Melatonin 5 MG TABS Take 5 mg by mouth at bedtime. (Patient not taking: Reported on 03/29/2021)     zinc oxide (BALMEX) 11.3 % CREA cream Apply 1 application topically 2 (two) times daily. (Patient not taking: Reported on 03/29/2021) 453 g 1   No facility-administered medications prior to visit.    Allergies  Allergen Reactions   Levofloxacin Rash   Gabapentin  Other (See Comments)    Other reaction(s): Confusion    Review of Systems As per HPI.     Objective:    Physical Exam Vitals and nursing note reviewed.  Constitutional:      General: She is not in acute distress.    Appearance: She is not ill-appearing, toxic-appearing or diaphoretic.  HENT:     Right Ear: Tympanic membrane and ear canal normal. There is impacted cerumen.     Left Ear: Tympanic membrane, ear canal and external ear normal.  Cardiovascular:     Rate and Rhythm: Normal rate and  regular rhythm.     Heart sounds: Normal heart sounds. No murmur heard. Pulmonary:     Effort: Pulmonary effort is normal. No respiratory distress.     Breath sounds: Normal breath sounds.  Musculoskeletal:     Right lower leg: 3+ Pitting Edema present.     Left lower leg: 3+ Pitting Edema present.  Skin:    Findings: No erythema, rash or wound.  Neurological:     Mental Status: She is alert. Mental status is at baseline.  Psychiatric:        Mood and Affect: Mood normal.        Behavior: Behavior normal.   Ear Cerumen Removal  Date/Time: 03/29/2021 10:40 AM Performed by: [name], FNP Authorized by: [name], FNP   Anesthesia: Local Anesthetic: none Location details: right ear Patient tolerance: patient tolerated the procedure well with no immediate complications Comments: Normal TM visualized following irrigation.  Procedure type: irrigation  Sedation: Patient sedated: no    BP 122/62   Pulse 64   Temp 98 F (36.7 C) (Temporal)   Resp 20   SpO2 93%  Wt Readings from Last 3 Encounters:  03/02/21 142 lb (64.4 kg)  02/24/21 148 lb (67.1 kg)  02/17/21 147 lb (66.7 kg)    Health Maintenance Due  Topic Date Due   COVID-19 Vaccine (1) Never done    There are no preventive care reminders to display for this patient.   No results found for: TSH Lab Results  Component Value Date   WBC 6.1 02/10/2021   HGB 11.0 (L) 02/10/2021   HCT 33.7 (L) 02/10/2021   MCV 94 02/10/2021   PLT 207 02/10/2021   Lab Results  Component Value Date   NA 143 03/02/2021   K 5.0 03/02/2021   CO2 25 03/02/2021   GLUCOSE 86 03/02/2021   BUN 28 03/02/2021   CREATININE 1.21 (H) 03/02/2021   BILITOT 0.9 05/13/2020   ALKPHOS 67 05/13/2020   AST 19 05/13/2020   ALT 13 05/13/2020   PROT 6.1 (L) 05/13/2020   ALBUMIN 2.8 (L) 05/13/2020   CALCIUM 9.4 03/02/2021   ANIONGAP 9 12/21/2020   EGFR 42 (L) 03/02/2021   No results found for: CHOL No results found for:  HDL No results found for: LDLCALC No results found for: TRIG No results found for: [facility] Lab Results  Component Value Date   HGBA1C 5.0 09/22/2019       Assessment & Plan:   Sapphira was seen today for cerumen impaction.  Diagnoses and all orders for this visit:  Peripheral edema Chronic with recent worsening. Orders to apply compression stocking every morning given. Increase lasix to 40 mg daily x 5 days, then resume 20 mg daily.   Impacted cerumen of right ear Irrigation today with good results. Normal TM visualized following irrigation.  -     Ear Cerumen Removal  Return to office for new or worsening symptoms, or if symptoms persist.   The patient indicates understanding of these issues and agrees with the plan.  [name], FNP

## 2021-03-29 NOTE — Patient Instructions (Signed)

## 2021-04-06 ENCOUNTER — Telehealth: Payer: Self-pay | Admitting: Family Medicine

## 2021-04-06 ENCOUNTER — Ambulatory Visit: Payer: Medicare Other | Admitting: Family Medicine

## 2021-04-07 ENCOUNTER — Ambulatory Visit (INDEPENDENT_AMBULATORY_CARE_PROVIDER_SITE_OTHER): Payer: Medicare Other | Admitting: Family Medicine

## 2021-04-07 ENCOUNTER — Other Ambulatory Visit: Payer: Self-pay

## 2021-04-07 ENCOUNTER — Encounter: Payer: Self-pay | Admitting: Family Medicine

## 2021-04-07 VITALS — BP 136/73 | HR 64 | Temp 99.1°F

## 2021-04-07 DIAGNOSIS — R609 Edema, unspecified: Secondary | ICD-10-CM

## 2021-04-07 DIAGNOSIS — L602 Onychogryphosis: Secondary | ICD-10-CM

## 2021-04-07 NOTE — Progress Notes (Signed)
Acute Office Visit  Subjective:    Patient ID: Kelly Freeman, female    DOB: [birth year], 87 y.o.   MRN: [id number]  Chief Complaint  Patient presents with   Edema    HPI Patient is in today for edema of lower legs. Here with family again today. They reports her legs have slowly become more swollen. Her granddaughter has been putting compression socks on her each morning when she visit and elevating her legs. If she is not there however, staff at [facility] report that Kelly Freeman refuses the compression stockings. They also do not elevate her legs. Denies fever, pain, wounds, drainage, shortness of breath, or chest pain.  She has been complaining of tenderness of the toenails on her left foot. The nails are thick and yellow.   Past Medical History:  Diagnosis Date   Acute cystitis without hematuria    Acute diarrhea    Allergy    Anxiety    Blood transfusion without reported diagnosis 1949   Cancer ([place])    skin cancer on face   Cataract    bilateral   Chronic allergic rhinitis    CKD (chronic kidney disease) stage 3, GFR 30-59 ml/min (HCC)    Cognitive impairment    DDD (degenerative disc disease), lumbar    Dizziness    E. coli UTI    Fever    Gastroenteritis    GERD (gastroesophageal reflux disease)    Hereditary and idiopathic peripheral neuropathy    History of recurrent UTIs    Hypertension    Hypertension    MGUS (monoclonal gammopathy of unknown significance)    Migraine without aura and without status migrainosus, not intractable    Mixed incontinence urge and stress    Moderate episode of recurrent major depressive disorder (HCC)    Nausea and vomiting    Obesity    Obstructive sleep apnea    Osteopenia    Recurrent falls    Restless leg syndrome    Sleep apnea    UTI (urinary tract infection)     Past Surgical History:  Procedure Laterality Date   APPENDECTOMY     CHOLECYSTECTOMY     EYE SURGERY     bilateral cataracts removed    Family History   Problem Relation Age of Onset   Thrombosis Father 7       Coronary   Stomach cancer Mother 12   Arthritis Son 40   Arthritis Daughter 67   Cirrhosis Daughter 81       Stage 3    Hepatitis C Daughter     Social History   Socioeconomic History   Marital status: Widowed    Spouse name: Not on file   Number of children: 3   Years of education: 12   Highest education level: High school graduate  Occupational History   Not on file  Tobacco Use   Smoking status: Never   Smokeless tobacco: Never  Vaping Use   Vaping Use: Never used  Substance and Sexual Activity   Alcohol use: Never   Drug use: Never   Sexual activity: Not Currently  Other Topics Concern   Not on file  Social History Narrative   Diet:       Do you drink/ eat things with caffeine?  Yes      Marital status:   Widowed  What year were you married ? 1948      Do you live in a house, apartment,assistred living, condo, trailer, etc.)? Trailer      Is it one or more stories? One      How many persons live in your home ? 2      Do you have any pets in your home ?(please list) NO      Current or past profession: Office Work      Do you exercise?   Not Much                           Type & how often:       Do you have a living will?       Do you have a DNR form?                       If not, do you want to discuss one?       Do you have signed POA?HPOA forms?                 If so, please bring to your        appointment      Social Determinants of Health   Financial Resource Strain: Not on file  Food Insecurity: Not on file  Transportation Needs: Not on file  Physical Activity: Not on file  Stress: Not on file  Social Connections: Not on file  Intimate Partner Violence: Not on file    Outpatient Medications Prior to Visit  Medication Sig Dispense Refill   acetaminophen (TYLENOL) 325 MG tablet Take 2 tablets (650 mg total) by mouth every 8 (eight) hours. 540 tablet 1    cephALEXin (KEFLEX) 250 MG capsule Take 250 mg by mouth daily.     cetirizine (ZYRTEC ALLERGY) 10 MG tablet Take 0.5 tablets (5 mg total) by mouth daily. 30 tablet 5   ferrous sulfate 325 (65 FE) MG tablet Take 325 mg by mouth every morning.     furosemide (LASIX) 20 MG tablet Take 1 tablet (20 mg total) by mouth daily. 30 tablet 0   haloperidol (HALDOL) 2 MG/ML solution Take 2 mg by mouth 2 (two) times daily as needed for agitation.     lisinopril (ZESTRIL) 2.5 MG tablet Take 1 tablet (2.5 mg total) by mouth daily. 30 tablet 3   LORazepam (ATIVAN) 0.5 MG tablet Take 0.25 mg by mouth every 12 (twelve) hours as needed for anxiety (agitation).     sertraline (ZOLOFT) 20 MG/ML concentrated solution Take 60 mg by mouth every morning.     valproic acid (DEPAKENE) 250 MG/5ML solution Take 125 mg by mouth in the morning and at bedtime.     zinc oxide (BALMEX) 11.3 % CREA cream Apply 1 application topically 2 (two) times daily. 453 g 1   No facility-administered medications prior to visit.    Allergies  Allergen Reactions   Levofloxacin Rash   Gabapentin Other (See Comments)    Other reaction(s): Confusion    Review of Systems As per HPI.     Objective:    Physical Exam Vitals and nursing note reviewed.  Constitutional:      General: She is not in acute distress.    Appearance: She is not ill-appearing, toxic-appearing or diaphoretic.  Cardiovascular:     Rate and Rhythm: Normal rate and regular rhythm.     Heart sounds: Normal heart  sounds. No murmur heard. Pulmonary:     Effort: Pulmonary effort is normal. No respiratory distress.     Breath sounds: Normal breath sounds.  Musculoskeletal:     Right lower leg: 3+ Pitting Edema present.     Left lower leg: 3+ Pitting Edema present.  Skin:    General: Skin is warm and dry.     Findings: Erythema (Mild erythema to bilateral lower legs. No drainage, warmth, tenderness, or wounds present.) present. No lesion or rash.     Comments:  Thickened yellow toe nails  Neurological:     Mental Status: She is alert. Mental status is at baseline.  Psychiatric:        Mood and Affect: Mood normal.        Behavior: Behavior normal.    BP 136/73   Pulse 64   Temp 99.1 F (37.3 C) (Temporal)  Wt Readings from Last 3 Encounters:  03/02/21 142 lb (64.4 kg)  02/24/21 148 lb (67.1 kg)  02/17/21 147 lb (66.7 kg)    Health Maintenance Due  Topic Date Due   COVID-19 Vaccine (1) Never done    There are no preventive care reminders to display for this patient.   No results found for: TSH Lab Results  Component Value Date   WBC 6.1 02/10/2021   HGB 11.0 (L) 02/10/2021   HCT 33.7 (L) 02/10/2021   MCV 94 02/10/2021   PLT 207 02/10/2021   Lab Results  Component Value Date   NA 143 03/02/2021   K 5.0 03/02/2021   CO2 25 03/02/2021   GLUCOSE 86 03/02/2021   BUN 28 03/02/2021   CREATININE 1.21 (H) 03/02/2021   BILITOT 0.9 05/13/2020   ALKPHOS 67 05/13/2020   AST 19 05/13/2020   ALT 13 05/13/2020   PROT 6.1 (L) 05/13/2020   ALBUMIN 2.8 (L) 05/13/2020   CALCIUM 9.4 03/02/2021   ANIONGAP 9 12/21/2020   EGFR 42 (L) 03/02/2021   No results found for: CHOL No results found for: HDL No results found for: LDLCALC No results found for: TRIG No results found for: [facility] Lab Results  Component Value Date   HGBA1C 5.0 09/22/2019       Assessment & Plan:   Tanicka was seen today for edema.  Diagnoses and all orders for this visit:  Peripheral edema Chronic with worsening since last visit. Bilateral unna boots applied today. No wounds or cellulitis present today. Orders to wear these until Monday given. Elevate legs as much as possible. Return to office for new or worsening symptoms, or if symptoms persist.   Thickened nail Referral to podiatry placed today.  -     Ambulatory referral to Podiatry  Return to office for new or worsening symptoms, or if symptoms persist.   The patient indicates understanding of  these issues and agrees with the plan.  [name], FNP

## 2021-04-07 NOTE — Patient Instructions (Signed)
[facility] An [employer] is a type of bandage (dressing) for the foot and leg. The dressing is a gauze wrap that is soaked with a type of medicine called zinc oxide. The gauze may also include other lotions and medicines that help in wound healing, such as calamine. An Unna boot may be used to treat: Open sores (ulcers) on the foot, heel, or leg. Swelling from disorders that affect the veins or lymphatic system (lymphedema). Skin conditions such as chronic inflammation caused by poor blood flow (stasis dermatitis). The dressing is applied by a health care provider. The gauze is wrapped around your lower extremity in several layers, usually starting at the toes and going upward to the knee. A dry outer wrap goes over the medicated wrap for supportand compression.  Before applying the [employer], your health care provider will clean your leg and foot and may apply an antibiotic ointment. You may be asked to raise (elevate) your leg for a while to reduce swelling before the boot is applied. The boot will dry and harden after it is applied. The boot may need to be changed orreplaced about twice a week. Follow these instructions at home: [place] as told by your health care provider. You may need to wear a slipper or shoe over the boot that is one or two sizes larger than normal. Check the skin around the boot every day. Tell your health care provider about any concerns. Do not stick anything inside the boot to scratch your skin. Doing that increases your risk of infection. Keep your [employer] clean and dry. Check every day for signs of infection. Check for: Redness, swelling, or pain in your foot or toes. Fluid or blood coming from the boot. Pus or a bad smell coming from the boot. Remove the boot and call your health care provider if you have signs of poor blood flow, such as: Your toes tingle or become numb. Your toes turn cold or turn blue or pale. Your toes are more swollen  or painful. You are unable to move your toes. Activity You may walk with the boot once it has dried. Ask your health care provider how much walking is safe for you. Avoid sitting for a long time without moving. Get up to take short walks as told by your health care provider. This is important to improve blood flow. Bathing Do not take baths, swim, or use a hot tub until your health care provider approves. Ask your health care provider if you may take showers. If your health care provider approves a bath or a shower, do not let the Unna boot get wet. If you take a shower, cover the boot with a watertight covering. If you take a bath, keep your leg with the boot out of the tub. General instructions Keep your leg elevated above the level of your heart while you are sitting or lying down. This will decrease swelling. Do not sit with your knee bent for long periods of time. Take over-the-counter and prescription medicines only as told by your health care provider. Do not use any products that contain nicotine or tobacco, such as cigarettes, e-cigarettes, and chewing tobacco. These can delay healing. If you need help quitting, ask your health care provider. Keep all follow-up visits as told by your health care provider. This is important. Contact a health care provider if: Your skin feels itchy inside the boot. You have a burning sensation, a rash, or  itchy, red, swollen areas of skin (hives) in the boot area. You have a fever or chills. You have any signs of infection, such as: New redness, swelling, or pain. More fluid or blood coming from the boot. Pus or a bad smell coming from the boot. You have increased numbness or pain in your foot or toes. You have any changes in skin color on your foot or toes, such as the skin turning blue or pale or developing patchy areas with spots. Your boot has been damaged or feels like it is no longer fitting properly. Summary An [employer]ta [place] boot is a type of bandage  (dressing) system for the foot and leg. The dressing is a gauze wrap that is soaked with a type of medicine (zinc oxide) to treat foot, heel, or leg ulcers, swelling from disorders that affect the veins or lymphatic system (lymphedema), and skin conditions caused by poor blood flow (stasis dermatitis). This dressing is applied by a health care provider. After it is applied, the boot will dry and harden. The boot may need to be changed or replaced about twice a week. Let your health care provider know if you have any signs of poor blood flow or infection. This information is not intended to replace advice given to you by your health care provider. Make sure you discuss any questions you have with your healthcare provider. Document Revised: 12/03/2018 Document Reviewed: 04/24/2018 Elsevier Patient Education  [place].

## 2021-04-11 ENCOUNTER — Ambulatory Visit (INDEPENDENT_AMBULATORY_CARE_PROVIDER_SITE_OTHER): Payer: Medicare Other | Admitting: Family Medicine

## 2021-04-11 ENCOUNTER — Other Ambulatory Visit: Payer: Self-pay

## 2021-04-11 ENCOUNTER — Encounter: Payer: Self-pay | Admitting: Family Medicine

## 2021-04-11 DIAGNOSIS — N390 Urinary tract infection, site not specified: Secondary | ICD-10-CM

## 2021-04-11 LAB — URINALYSIS, ROUTINE W REFLEX MICROSCOPIC
Bilirubin, UA: NEGATIVE
Glucose, UA: NEGATIVE
Ketones, UA: NEGATIVE
Leukocytes,UA: NEGATIVE
Nitrite, UA: NEGATIVE
Protein,UA: NEGATIVE
RBC, UA: NEGATIVE
Specific Gravity, UA: 1.015 (ref 1.005–1.030)
Urobilinogen, Ur: 0.2 mg/dL (ref 0.2–1.0)
pH, UA: 6.5 (ref 5.0–7.5)

## 2021-04-11 MED ORDER — AMOXICILLIN 875 MG PO TABS: 875.0000 mg | ORAL_TABLET | Freq: Two times a day (BID) | ORAL | 0 refills | Status: AC

## 2021-04-11 NOTE — Progress Notes (Signed)
Virtual Visit  Note Due to COVID-19 pandemic this visit was conducted virtually. This visit type was conducted due to national recommendations for restrictions regarding the COVID-19 Pandemic (e.g. social distancing, sheltering in place) in an effort to limit this patient's exposure and mitigate transmission in our community. All issues noted in this document were discussed and addressed.  A physical exam was not performed with this format.  I connected with Kelly Freeman on 04/11/21 at 0955 by telephone and verified that I am speaking with the correct person using two identifiers. Kelly Freeman is currently located at [facility] and her daughter and granddaughter is currently with currently during her the visit. The provider, [name], FNP is located in their office at time of visit.  I discussed the limitations, risks, security and privacy concerns of performing an evaluation and management service by telephone and the availability of in person appointments. I also discussed with the patient that there may be a patient responsible charge related to this service. The patient expressed understanding and agreed to proceed.  CC: UTI   History and Present Illness:  HPI History was provided by Kelly Freeman daughter Kelly Freeman today. Kelly Freeman has had urinary frequency and urgency for about 1 week. For the last 2 days she has not some increased confusion, agitation, and reports that she doesn't feel well. She denies pain, fever, or chills. Denies focal weakness. There is a history of recurrent UTIs that typically present in this way. She currently take keflex daily for this. Her unna boots have been removed by staff. Edema has improved significantly. She current has on her compression stockings. They will be able to bring my a urine specimen for testing today.     ROS As per HPI.   Observations/Objective: Alert and oriented x 3. Able to speak in full sentences without difficulty.   Assessment and  Plan: [name] was seen today for urinary tract infection.  Diagnoses and all orders for this visit:  Recurrent UTI Will bring urine for testing as below today. Do not start antibiotic until urine is obtained. Hold keflex and start amoxicllin pending culture results. Return to office for new or worsening symptoms, or if symptoms persist.  -     amoxicillin (AMOXIL) 875 MG tablet; Take 1 tablet (875 mg total) by mouth 2 (two) times daily for 7 days. Hold keflex while taking amoxicillin. Restart kelex after completion of amoxicillin. -     Urinalysis, Routine w reflex microscopic; Future -     Urine Culture; Future    Follow Up Instructions: Return to office for new or worsening symptoms, or if symptoms persist.     I discussed the assessment and treatment plan with the patient. The patient was provided an opportunity to ask questions and all were answered. The patient agreed with the plan and demonstrated an understanding of the instructions.   The patient was advised to call back or seek an in-person evaluation if the symptoms worsen or if the condition fails to improve as anticipated.  The above assessment and management plan was discussed with the patient. The patient verbalized understanding of and has agreed to the management plan. Patient is aware to call the clinic if symptoms persist or worsen. Patient is aware when to return to the clinic for a follow-up visit. Patient educated on when it is appropriate to go to the emergency department.   Time call ended:  1059  I provided 14 minutes of  non face-to-face time during this encounter.  [name], FNP

## 2021-04-12 LAB — URINE CULTURE

## 2021-04-13 ENCOUNTER — Telehealth: Payer: Self-pay | Admitting: Family Medicine

## 2021-04-13 ENCOUNTER — Encounter: Payer: Self-pay | Admitting: *Deleted

## 2021-04-13 NOTE — Telephone Encounter (Signed)
Daughter called and states [facility] needs order faxed to D/C amoxicillin and resume keflex daily. D/C orders faxed to [facility]

## 2021-05-04 ENCOUNTER — Ambulatory Visit: Payer: Medicare Other | Admitting: Family Medicine

## 2021-05-17 ENCOUNTER — Encounter: Payer: Self-pay | Admitting: Family Medicine

## 2021-05-17 ENCOUNTER — Ambulatory Visit (INDEPENDENT_AMBULATORY_CARE_PROVIDER_SITE_OTHER): Payer: Medicare Other | Admitting: Family Medicine

## 2021-05-17 DIAGNOSIS — R609 Edema, unspecified: Secondary | ICD-10-CM

## 2021-05-17 DIAGNOSIS — M5136 Other intervertebral disc degeneration, lumbar region: Secondary | ICD-10-CM | POA: Insufficient documentation

## 2021-05-17 DIAGNOSIS — M51369 Other intervertebral disc degeneration, lumbar region without mention of lumbar back pain or lower extremity pain: Secondary | ICD-10-CM | POA: Insufficient documentation

## 2021-05-17 DIAGNOSIS — L03116 Cellulitis of left lower limb: Secondary | ICD-10-CM

## 2021-05-17 DIAGNOSIS — L03115 Cellulitis of right lower limb: Secondary | ICD-10-CM

## 2021-05-17 MED ORDER — FUROSEMIDE 20 MG PO TABS: 20.0000 mg | ORAL_TABLET | Freq: Every day | ORAL | 3 refills | Status: DC

## 2021-05-17 MED ORDER — DOXYCYCLINE HYCLATE 100 MG PO TABS: 100.0000 mg | ORAL_TABLET | Freq: Two times a day (BID) | ORAL | 0 refills | Status: AC

## 2021-05-17 NOTE — Progress Notes (Signed)
Virtual Visit via telephone Note Due to COVID-19 pandemic this visit was conducted virtually. This visit type was conducted due to national recommendations for restrictions regarding the COVID-19 Pandemic (e.g. social distancing, sheltering in place) in an effort to limit this patient's exposure and mitigate transmission in our community. All issues noted in this document were discussed and addressed.  A physical exam was not performed with this format.   I connected with Kelly Freeman' daughter, POA, on 05/17/2021 at 1215 by telephone and verified that I am speaking with the correct person using two identifiers. Kelly Freeman is currently located at assisted nursing facility and representative is currently with them during visit. The provider, Monia Pouch, FNP is located in their office at time of visit.  I discussed the limitations, risks, security and privacy concerns of performing an evaluation and management service by telephone and the availability of in person appointments. I also discussed with the patient that there may be a patient responsible charge related to this service. The patient expressed understanding and agreed to proceed.  Subjective:  Patient ID: Kelly Freeman, female    DOB: 08-03-1927, 87 y.o.   MRN: [id number]  Chief Complaint:  Leg Swelling   HPI: Kelly Freeman is a 87 y.o. female presenting on 05/17/2021 for Leg Swelling   Daughter, who is pts POA, reports increased bilateral lower extremity edema with erythema and increased warmth. No fever, chills, or worsening confusion. States pt will not keep legs elevated unless someone is with her to remind her to do so. She had to have unna boots placed last month and did well with this. Daughter denies any open wounds or drainage.     Relevant past medical, surgical, family, and social history reviewed and updated as indicated.  Allergies and medications reviewed and updated.   Past Medical History:  Diagnosis Date    Acute cystitis without hematuria    Acute diarrhea    Allergy    Anxiety    Blood transfusion without reported diagnosis 1949   Cancer ([place])    skin cancer on face   Cataract    bilateral   Chronic allergic rhinitis    CKD (chronic kidney disease) stage 3, GFR 30-59 ml/min (HCC)    Cognitive impairment    DDD (degenerative disc disease), lumbar    Dizziness    E. coli UTI    Fever    Gastroenteritis    GERD (gastroesophageal reflux disease)    Hereditary and idiopathic peripheral neuropathy    History of recurrent UTIs    Hypertension    Hypertension    MGUS (monoclonal gammopathy of unknown significance)    Migraine without aura and without status migrainosus, not intractable    Mixed incontinence urge and stress    Moderate episode of recurrent major depressive disorder (HCC)    Nausea and vomiting    Obesity    Obstructive sleep apnea    Osteopenia    Recurrent falls    Restless leg syndrome    Sleep apnea    UTI (urinary tract infection)     Past Surgical History:  Procedure Laterality Date   APPENDECTOMY     CHOLECYSTECTOMY     EYE SURGERY     bilateral cataracts removed    Social History   Socioeconomic History   Marital status: Widowed    Spouse name: Not on file   Number of children: 3   Years of education: 12   Highest education level: High school graduate  Occupational History   Not on file  Tobacco Use   Smoking status: Never   Smokeless tobacco: Never  Vaping Use   Vaping Use: Never used  Substance and Sexual Activity   Alcohol use: Never   Drug use: Never   Sexual activity: Not Currently  Other Topics Concern   Not on file  Social History Narrative   Diet:       Do you drink/ eat things with caffeine?  Yes      Marital status:   Widowed                            What year were you married ? 1948      Do you live in a house, apartment,assistred living, condo, trailer, etc.)? Trailer      Is it one or more stories? One      How  many persons live in your home ? 2      Do you have any pets in your home ?(please list) NO      Current or past profession: Office Work      Do you exercise?   Not Much                           Type & how often:       Do you have a living will?       Do you have a DNR form?                       If not, do you want to discuss one?       Do you have signed POA?HPOA forms?                 If so, please bring to your        appointment      Social Determinants of Health   Financial Resource Strain: Not on file  Food Insecurity: Not on file  Transportation Needs: Not on file  Physical Activity: Not on file  Stress: Not on file  Social Connections: Not on file  Intimate Partner Violence: Not on file    Outpatient Encounter Medications as of 05/17/2021  Medication Sig   doxycycline (VIBRA-TABS) 100 MG tablet Take 1 tablet (100 mg total) by mouth 2 (two) times daily for 10 days. 1 po bid   furosemide (LASIX) 20 MG tablet Take 1 tablet (20 mg total) by mouth daily for 3 days. Add to usual 20 mg dose totaling 40 mg for next 3 days then return to 20 mg daily   acetaminophen (TYLENOL) 325 MG tablet Take 2 tablets (650 mg total) by mouth every 8 (eight) hours.   cetirizine (ZYRTEC ALLERGY) 10 MG tablet Take 0.5 tablets (5 mg total) by mouth daily.   ferrous sulfate 325 (65 FE) MG tablet Take 325 mg by mouth every morning.   furosemide (LASIX) 20 MG tablet Take 1 tablet (20 mg total) by mouth daily.   haloperidol (HALDOL) 2 MG/ML solution Take 2 mg by mouth 2 (two) times daily as needed for agitation.   lisinopril (ZESTRIL) 2.5 MG tablet Take 1 tablet (2.5 mg total) by mouth daily.   LORazepam (ATIVAN) 0.5 MG tablet Take 0.25 mg by mouth every 12 (twelve) hours as needed for anxiety (agitation).   sertraline (ZOLOFT) 20 MG/ML concentrated solution Take 60 mg by mouth  every morning.   valproic acid (DEPAKENE) 250 MG/5ML solution Take 125 mg by mouth in the morning and at bedtime.   zinc  oxide (BALMEX) 11.3 % CREA cream Apply 1 application topically 2 (two) times daily.   [DISCONTINUED] cephALEXin (KEFLEX) 250 MG capsule Take 250 mg by mouth daily.   No facility-administered encounter medications on file as of 05/17/2021.    Allergies  Allergen Reactions   Levofloxacin Rash   Gabapentin Other (See Comments)    Other reaction(s): Confusion    Review of Systems  Constitutional:  Negative for activity change, appetite change, chills, diaphoresis, fatigue, fever and unexpected weight change.  HENT: Negative.    Eyes: Negative.   Respiratory:  Negative for cough, chest tightness and shortness of breath.   Cardiovascular:  Positive for leg swelling. Negative for chest pain and palpitations.  Gastrointestinal:  Negative for abdominal pain, blood in stool, constipation, diarrhea, nausea and vomiting.  Endocrine: Negative.   Genitourinary:  Negative for decreased urine volume, dysuria, frequency and urgency.  Musculoskeletal:  Negative for arthralgias and myalgias.  Skin:  Positive for color change. Negative for wound.  Allergic/Immunologic: Negative.   Neurological:  Negative for dizziness and headaches.  Hematological: Negative.   Psychiatric/Behavioral:  Positive for confusion (baseline). Negative for hallucinations, sleep disturbance and suicidal ideas.   All other systems reviewed and are negative.       Observations/Objective: No vital signs or physical exam, this was a telephone or virtual health encounter.  Pt alert and oriented, answers all questions appropriately, and able to speak in full sentences.    Assessment and Plan: Enes was seen today for leg swelling.  Diagnoses and all orders for this visit:  Bilateral cellulitis of lower leg Will initiate below. Pt to follow up in 1 week or sooner if symptoms progress. Will likely require reapplication of Unna boots bilaterally  -     doxycycline (VIBRA-TABS) 100 MG tablet; Take 1 tablet (100 mg total) by  mouth 2 (two) times daily for 10 days. 1 po bid  Peripheral edema Doxycycline as prescribed for cellulitis. Limit sodium intake and elevate legs when sitting or lying. Increase lasix to 40 mg daily for 3 days. Follow up in one week for reevaluation and repeat BMP. Report any new, worsening, or persistent symptoms.  -     doxycycline (VIBRA-TABS) 100 MG tablet; Take 1 tablet (100 mg total) by mouth 2 (two) times daily for 10 days. 1 po bid -     furosemide (LASIX) 20 MG tablet; Take 1 tablet (20 mg total) by mouth daily for 3 days. Add to usual 20 mg dose totaling 40 mg for next 3 days then return to 20 mg daily    Follow Up Instructions: Return in about 1 week (around 05/24/2021), or if symptoms worsen or fail to improve.    I discussed the assessment and treatment plan with the patient. The patient was provided an opportunity to ask questions and all were answered. The patient agreed with the plan and demonstrated an understanding of the instructions.   The patient was advised to call back or seek an in-person evaluation if the symptoms worsen or if the condition fails to improve as anticipated.  The above assessment and management plan was discussed with the patient. The patient verbalized understanding of and has agreed to the management plan. Patient is aware to call the clinic if they develop any new symptoms or if symptoms persist or worsen. Patient is aware when to  return to the clinic for a follow-up visit. Patient educated on when it is appropriate to go to the emergency department.    I provided 15 minutes of non-face-to-face time during this encounter. The call started at 1215. The call ended at 1330. The other time was used for coordination of care.    Monia Pouch, FNP-C [facility] [address] [place], [place] [address] [phone] 05/17/2021

## 2021-05-21 ENCOUNTER — Emergency Department (HOSPITAL_COMMUNITY)
Admission: EM | Admit: 2021-05-21 | Discharge: 2021-05-21 | Disposition: A | Discharge status: Home / Self Care | Payer: Medicare Other | Attending: Emergency Medicine | Admitting: Emergency Medicine

## 2021-05-21 ENCOUNTER — Encounter (HOSPITAL_COMMUNITY): Payer: Self-pay

## 2021-05-21 ENCOUNTER — Other Ambulatory Visit: Payer: Self-pay

## 2021-05-21 ENCOUNTER — Emergency Department (HOSPITAL_COMMUNITY): Payer: Medicare Other

## 2021-05-21 DIAGNOSIS — R296 Repeated falls: Secondary | ICD-10-CM | POA: Diagnosis not present

## 2021-05-21 DIAGNOSIS — R0902 Hypoxemia: Secondary | ICD-10-CM | POA: Diagnosis not present

## 2021-05-21 DIAGNOSIS — W19XXXA Unspecified fall, initial encounter: Secondary | ICD-10-CM

## 2021-05-21 DIAGNOSIS — Z743 Need for continuous supervision: Secondary | ICD-10-CM | POA: Diagnosis not present

## 2021-05-21 DIAGNOSIS — W01198A Fall on same level from slipping, tripping and stumbling with subsequent striking against other object, initial encounter: Secondary | ICD-10-CM | POA: Diagnosis not present

## 2021-05-21 DIAGNOSIS — I129 Hypertensive chronic kidney disease with stage 1 through stage 4 chronic kidney disease, or unspecified chronic kidney disease: Secondary | ICD-10-CM | POA: Diagnosis not present

## 2021-05-21 DIAGNOSIS — Z85828 Personal history of other malignant neoplasm of skin: Secondary | ICD-10-CM | POA: Diagnosis not present

## 2021-05-21 DIAGNOSIS — Y92129 Unspecified place in nursing home as the place of occurrence of the external cause: Secondary | ICD-10-CM | POA: Insufficient documentation

## 2021-05-21 DIAGNOSIS — F0390 Unspecified dementia without behavioral disturbance: Secondary | ICD-10-CM | POA: Insufficient documentation

## 2021-05-21 DIAGNOSIS — Z79899 Other long term (current) drug therapy: Secondary | ICD-10-CM | POA: Insufficient documentation

## 2021-05-21 DIAGNOSIS — M79605 Pain in left leg: Secondary | ICD-10-CM | POA: Diagnosis not present

## 2021-05-21 DIAGNOSIS — R404 Transient alteration of awareness: Secondary | ICD-10-CM | POA: Diagnosis not present

## 2021-05-21 DIAGNOSIS — N183 Chronic kidney disease, stage 3 unspecified: Secondary | ICD-10-CM | POA: Insufficient documentation

## 2021-05-21 DIAGNOSIS — S0990XA Unspecified injury of head, initial encounter: Secondary | ICD-10-CM | POA: Diagnosis not present

## 2021-05-21 DIAGNOSIS — R519 Headache, unspecified: Secondary | ICD-10-CM | POA: Diagnosis not present

## 2021-05-21 DIAGNOSIS — I6523 Occlusion and stenosis of bilateral carotid arteries: Secondary | ICD-10-CM | POA: Diagnosis not present

## 2021-05-21 NOTE — ED Notes (Signed)
Attempted report X1

## 2021-05-21 NOTE — ED Triage Notes (Addendum)
Fall at [facility]. Hit head.  Hx of dementia. Unable to give history  Falls frequently Facility protocol to send falls for eval.   DNR at bedside  Started taking Haldol 2x daily since the 14th of this month for agitation

## 2021-05-21 NOTE — ED Notes (Signed)
Attempted report x6

## 2021-05-21 NOTE — ED Provider Notes (Signed)
[facility] EMERGENCY DEPARTMENT Provider Note   CSN: [id number] Arrival date & time: 05/21/21  1654     History Chief Complaint  Patient presents with   Lytle Michaels    Kelly Freeman is a 87 y.o. female.  Patient fell at the nursing home.  Patient has dementia and does not remember falling.  According to the staff she hit her head but she is acting like her normal self  The history is provided by medical records and the nursing home. No language interpreter was used.  Fall This is a new problem. The current episode started 6 to 12 hours ago. The problem occurs constantly. The problem has been resolved. Pertinent negatives include no chest pain. Nothing aggravates the symptoms. She has tried nothing for the symptoms. The treatment provided no relief.      Past Medical History:  Diagnosis Date   Acute cystitis without hematuria    Acute diarrhea    Allergy    Anxiety    Blood transfusion without reported diagnosis 1949   Cancer ([place])    skin cancer on face   Cataract    bilateral   Chronic allergic rhinitis    CKD (chronic kidney disease) stage 3, GFR 30-59 ml/min (HCC)    Cognitive impairment    DDD (degenerative disc disease), lumbar    Dizziness    E. coli UTI    Fever    Gastroenteritis    GERD (gastroesophageal reflux disease)    Hereditary and idiopathic peripheral neuropathy    History of recurrent UTIs    Hypertension    Hypertension    MGUS (monoclonal gammopathy of unknown significance)    Migraine without aura and without status migrainosus, not intractable    Mixed incontinence urge and stress    Moderate episode of recurrent major depressive disorder (HCC)    Nausea and vomiting    Obesity    Obstructive sleep apnea    Osteopenia    Recurrent falls    Restless leg syndrome    Sleep apnea    UTI (urinary tract infection)     Patient Active Problem List   Diagnosis Date Noted   DDD (degenerative disc disease), lumbar 05/17/2021   Recurrent UTI  02/01/2021   Dementia with behavioral disturbance ([place]) 01/04/2021   Pain    Palliative care by specialist    Goals of care, counseling/discussion    Hematoma, nontraumatic, soft tissue 12/18/2020   Fall at home, initial encounter 12/18/2020   History of DVT (deep vein thrombosis) 12/18/2020   Recurrent falls    Moderate episode of recurrent major depressive disorder (HCC)    MGUS (monoclonal gammopathy of unknown significance)    Primary hypertension    History of recurrent UTIs    CKD (chronic kidney disease) stage 3, GFR 30-59 ml/min (HCC)     Past Surgical History:  Procedure Laterality Date   APPENDECTOMY     CHOLECYSTECTOMY     EYE SURGERY     bilateral cataracts removed     OB History   No obstetric history on file.     Family History  Problem Relation Age of Onset   Thrombosis Father 25       Coronary   Stomach cancer Mother 16   Arthritis Son 34   Arthritis Daughter 30   Cirrhosis Daughter 3       Stage 3    Hepatitis C Daughter     Social History   Tobacco Use   Smoking  status: Never   Smokeless tobacco: Never  Vaping Use   Vaping Use: Never used  Substance Use Topics   Alcohol use: Never   Drug use: Never    Home Medications Prior to Admission medications   Medication Sig Start Date End Date Taking? Authorizing Provider  acetaminophen (TYLENOL) 325 MG tablet Take 2 tablets (650 mg total) by mouth every 8 (eight) hours. 01/04/21   [name], FNP  cetirizine (ZYRTEC ALLERGY) 10 MG tablet Take 0.5 tablets (5 mg total) by mouth daily. 03/04/21   [name], FNP  doxycycline (VIBRA-TABS) 100 MG tablet Take 1 tablet (100 mg total) by mouth 2 (two) times daily for 10 days. 1 po bid 05/17/21 05/27/21  Baruch Gouty, FNP  ferrous sulfate 325 (65 FE) MG tablet Take 325 mg by mouth every morning.    [provider]  furosemide (LASIX) 20 MG tablet Take 1 tablet (20 mg total) by mouth daily. 02/01/21   [name], FNP  furosemide  (LASIX) 20 MG tablet Take 1 tablet (20 mg total) by mouth daily for 3 days. Add to usual 20 mg dose totaling 40 mg for next 3 days then return to 20 mg daily 05/17/21 05/20/21  Baruch Gouty, FNP  haloperidol (HALDOL) 2 MG/ML solution Take 2 mg by mouth 2 (two) times daily as needed for agitation.    [provider]  lisinopril (ZESTRIL) 2.5 MG tablet Take 1 tablet (2.5 mg total) by mouth daily. 09/23/19   Lauree Chandler, NP  LORazepam (ATIVAN) 0.5 MG tablet Take 0.25 mg by mouth every 12 (twelve) hours as needed for anxiety (agitation).    [provider]  sertraline (ZOLOFT) 20 MG/ML concentrated solution Take 60 mg by mouth every morning.    [provider]  valproic acid (DEPAKENE) 250 MG/5ML solution Take 125 mg by mouth in the morning and at bedtime.    [provider]  zinc oxide (BALMEX) 11.3 % CREA cream Apply 1 application topically 2 (two) times daily. 01/19/21   [name], FNP    Allergies    Levofloxacin and Gabapentin  Review of Systems   Review of Systems  Unable to perform ROS: Dementia  Cardiovascular:  Negative for chest pain.   Physical Exam Updated Vital Signs BP (!) 133/93   Pulse 82   Resp 20   Ht 5\' 7"  (1.702 m)   Wt 64.4 kg   SpO2 97%   BMI 22.24 kg/m   Physical Exam Constitutional:      Appearance: She is well-developed.  HENT:     Head: Normocephalic.  Eyes:     General: No scleral icterus.    Conjunctiva/sclera: Conjunctivae normal.  Neck:     Thyroid: No thyromegaly.  Cardiovascular:     Rate and Rhythm: Normal rate and regular rhythm.     Heart sounds: No murmur heard.   No friction rub. No gallop.  Pulmonary:     Breath sounds: No stridor. No wheezing or rales.  Chest:     Chest wall: No tenderness.  Abdominal:     General: There is no distension.     Tenderness: There is no abdominal tenderness. There is no rebound.  Musculoskeletal:        General: Normal range of motion.     Cervical back:  Neck supple.  Lymphadenopathy:     Cervical: No cervical adenopathy.  Skin:    Findings: No erythema or rash.  Neurological:  Mental Status: She is alert.     Motor: No abnormal muscle tone.     Coordination: Coordination normal.     Comments: Oriented to person only but alert  Psychiatric:        Behavior: Behavior normal.    ED Results / Procedures / Treatments   Labs (all labs ordered are listed, but only abnormal results are displayed) Labs Reviewed - No data to display  EKG None  Radiology DG Pelvis 1-2 Views  Result Date: 05/21/2021 CLINICAL DATA:  Left leg pain after fall. EXAM: PELVIS - 1-2 VIEW COMPARISON:  May 13, 2020. FINDINGS: There is no evidence of pelvic fracture or diastasis. No pelvic bone lesions are seen. Moderate degenerative changes are seen involving both hip joints. IMPRESSION: No acute abnormality is noted. Electronically Signed   By: Marijo Conception M.D.   On: 05/21/2021 17:51   CT HEAD WO CONTRAST (5MM)  Result Date: 05/21/2021 CLINICAL DATA:  Fall at [facility]. Hit head. Hx of dementia. Unable to give history. Falls frequently per nursing home. EXAM: CT HEAD WITHOUT CONTRAST TECHNIQUE: Contiguous axial images were obtained from the base of the skull through the vertex without intravenous contrast. COMPARISON:  CT head 02/02/2021 BRAIN: BRAIN Cerebral ventricle sizes are concordant with the degree of cerebral volume loss. Cerebral ventricle sizes are concordant with the degree of cerebral volume loss. Patchy and confluent areas of decreased attenuation are noted throughout the deep and periventricular white matter of the cerebral hemispheres bilaterally, compatible with chronic microvascular ischemic disease. No evidence of large-territorial acute infarction. No parenchymal hemorrhage. No mass lesion. No extra-axial collection. No mass effect or midline shift. No hydrocephalus. Basilar cisterns are patent. Vascular: No hyperdense vessel.  Atherosclerotic calcifications are present within the cavernous internal carotid arteries. Skull: No acute fracture or focal lesion. Sinuses/Orbits: Paranasal sinuses and mastoid air cells are clear. Left lens replacement. Otherwise the orbits are unremarkable. Other: None. IMPRESSION: Negative for acute traumatic injury. Electronically Signed   By: Iven Finn M.D.   On: 05/21/2021 17:55    Procedures Procedures   Medications Ordered in ED Medications - No data to display  ED Course  I have reviewed the triage vital signs and the nursing notes.  Pertinent labs & imaging results that were available during my care of the patient were reviewed by me and considered in my medical decision making (see chart for details).    MDM Rules/Calculators/A&P                           Patient with a fall with no significant injuries.  CT head was negative and pelvis x-rays negative.  She will follow-up with her PCP if needed Final Clinical Impression(s) / ED Diagnoses Final diagnoses:  Fall, initial encounter    Rx / DC Orders ED Discharge Orders     None        Milton Ferguson, MD 05/23/21 1147

## 2021-05-21 NOTE — Discharge Instructions (Addendum)
Follow-up with your doctor next week for recheck if any problems

## 2021-05-21 NOTE — ED Notes (Signed)
Attempted report x 3.  

## 2021-05-31 ENCOUNTER — Encounter: Payer: Medicare Other | Admitting: Family Medicine

## 2021-05-31 NOTE — Progress Notes (Signed)
Unable to conduct visit as daughter is not with the patient at this time. Rescheduled for tomorrow when she can be with her. Patient lives at [facility].

## 2021-06-01 ENCOUNTER — Encounter: Payer: Self-pay | Admitting: Family Medicine

## 2021-06-01 ENCOUNTER — Ambulatory Visit (INDEPENDENT_AMBULATORY_CARE_PROVIDER_SITE_OTHER): Payer: Medicare Other | Admitting: Family Medicine

## 2021-06-01 DIAGNOSIS — J0100 Acute maxillary sinusitis, unspecified: Secondary | ICD-10-CM

## 2021-06-01 MED ORDER — AZITHROMYCIN 250 MG PO TABS: ORAL_TABLET | ORAL | 0 refills | Status: DC

## 2021-06-01 NOTE — Progress Notes (Signed)
   Virtual Visit  Note Due to COVID-19 pandemic this visit was conducted virtually. This visit type was conducted due to national recommendations for restrictions regarding the COVID-19 Pandemic (e.g. social distancing, sheltering in place) in an effort to limit this patient's exposure and mitigate transmission in our community. All issues noted in this document were discussed and addressed.  A physical exam was not performed with this format.  I connected with Kelly Freeman on 06/01/21 at 1118 by telephone and verified that I am speaking with the correct person using two identifiers. Kelly Freeman is currently located at her residence at [place] and her granddaughter and daughter are currently with her during the visit. The provider, Kelly Perking, FNP is located in their office at time of visit.  I discussed the limitations, risks, security and privacy concerns of performing an evaluation and management service by telephone and the availability of in person appointments. I also discussed with the patient that there may be a patient responsible charge related to this service. The patient expressed understanding and agreed to proceed.  CC: sinus pressure  History and Present Illness:  HPI History was provided by Kelly Freeman's daughter and granddaughter. Kelly Freeman has had increased congestion for 5 days. She also has had chills and has been complaining of sinus pressure at her left cheek. She denies fever, chest pain, shortness of breath, sore throat, ear pain, cough, nausea, vomiting, or diarrhea. She takes zyrtec daily. She is on keflex daily for UTI prevention.     ROS As per HPI.   Observations/Objective: Return to office for new or worsening symptoms, or if symptoms persist.   Assessment and Plan: Kelly Freeman was seen today for sinusitis.  Diagnoses and all orders for this visit:  Acute non-recurrent maxillary sinusitis Zpak as below. Continue zyrtec. Discussed nasal saline. Return to office  for new or worsening symptoms, or if symptoms persist.  -     azithromycin (ZITHROMAX) 250 MG tablet; Take 2 tablets on day 1, then 1 tablet daily on days 2 through 5    Follow Up Instructions: As needed.     I discussed the assessment and treatment plan with the patient. The patient was provided an opportunity to ask questions and all were answered. The patient agreed with the plan and demonstrated an understanding of the instructions.   The patient was advised to call back or seek an in-person evaluation if the symptoms worsen or if the condition fails to improve as anticipated.  The above assessment and management plan was discussed with the patient. The patient verbalized understanding of and has agreed to the management plan. Patient is aware to call the clinic if symptoms persist or worsen. Patient is aware when to return to the clinic for a follow-up visit. Patient educated on when it is appropriate to go to the emergency department.   Time call ended:  1129  I provided 11 minutes of  non face-to-face time during this encounter.    Kelly Perking, FNP

## 2021-06-06 ENCOUNTER — Other Ambulatory Visit: Payer: Self-pay

## 2021-06-06 ENCOUNTER — Ambulatory Visit: Payer: Medicare Other | Admitting: Family Medicine

## 2021-06-06 ENCOUNTER — Emergency Department (HOSPITAL_COMMUNITY): Payer: Medicare Other

## 2021-06-06 ENCOUNTER — Encounter (HOSPITAL_COMMUNITY): Payer: Self-pay | Admitting: Emergency Medicine

## 2021-06-06 ENCOUNTER — Observation Stay (HOSPITAL_COMMUNITY)
Admission: EM | Admit: 2021-06-06 | Discharge: 2021-06-07 | Disposition: A | Discharge status: Home / Self Care | Payer: Medicare Other | Attending: Internal Medicine | Admitting: Internal Medicine

## 2021-06-06 DIAGNOSIS — R2681 Unsteadiness on feet: Secondary | ICD-10-CM | POA: Diagnosis not present

## 2021-06-06 DIAGNOSIS — F0390 Unspecified dementia without behavioral disturbance: Secondary | ICD-10-CM | POA: Insufficient documentation

## 2021-06-06 DIAGNOSIS — N1832 Chronic kidney disease, stage 3b: Secondary | ICD-10-CM | POA: Diagnosis not present

## 2021-06-06 DIAGNOSIS — R404 Transient alteration of awareness: Secondary | ICD-10-CM | POA: Diagnosis not present

## 2021-06-06 DIAGNOSIS — Z20822 Contact with and (suspected) exposure to covid-19: Secondary | ICD-10-CM | POA: Diagnosis not present

## 2021-06-06 DIAGNOSIS — G9340 Encephalopathy, unspecified: Secondary | ICD-10-CM | POA: Diagnosis not present

## 2021-06-06 DIAGNOSIS — I10 Essential (primary) hypertension: Secondary | ICD-10-CM | POA: Diagnosis not present

## 2021-06-06 DIAGNOSIS — Z79899 Other long term (current) drug therapy: Secondary | ICD-10-CM | POA: Insufficient documentation

## 2021-06-06 DIAGNOSIS — R059 Cough, unspecified: Secondary | ICD-10-CM | POA: Diagnosis not present

## 2021-06-06 DIAGNOSIS — R6889 Other general symptoms and signs: Secondary | ICD-10-CM | POA: Diagnosis not present

## 2021-06-06 DIAGNOSIS — Z743 Need for continuous supervision: Secondary | ICD-10-CM | POA: Diagnosis not present

## 2021-06-06 DIAGNOSIS — G9341 Metabolic encephalopathy: Principal | ICD-10-CM

## 2021-06-06 DIAGNOSIS — I129 Hypertensive chronic kidney disease with stage 1 through stage 4 chronic kidney disease, or unspecified chronic kidney disease: Secondary | ICD-10-CM | POA: Insufficient documentation

## 2021-06-06 DIAGNOSIS — E860 Dehydration: Secondary | ICD-10-CM | POA: Diagnosis not present

## 2021-06-06 DIAGNOSIS — R531 Weakness: Secondary | ICD-10-CM | POA: Diagnosis not present

## 2021-06-06 HISTORY — DX: Unspecified dementia, unspecified severity, without behavioral disturbance, psychotic disturbance, mood disturbance, and anxiety: F03.90

## 2021-06-06 LAB — COMPREHENSIVE METABOLIC PANEL
ALT: 18 U/L (ref 0–44)
AST: 35 U/L (ref 15–41)
Albumin: 3.7 g/dL (ref 3.5–5.0)
Alkaline Phosphatase: 64 U/L (ref 38–126)
Anion gap: 8 (ref 5–15)
BUN: 40 mg/dL — ABNORMAL HIGH (ref 8–23)
CO2: 29 mmol/L (ref 22–32)
Calcium: 9.5 mg/dL (ref 8.9–10.3)
Chloride: 102 mmol/L (ref 98–111)
Creatinine, Ser: 1.37 mg/dL — ABNORMAL HIGH (ref 0.44–1.00)
GFR, Estimated: 36 mL/min — ABNORMAL LOW (ref >60)
Glucose, Bld: 96 mg/dL (ref 70–99)
Potassium: 4.1 mmol/L (ref 3.5–5.1)
Sodium: 139 mmol/L (ref 135–145)
Total Bilirubin: 0.6 mg/dL (ref 0.3–1.2)
Total Protein: 7.3 g/dL (ref 6.5–8.1)

## 2021-06-06 LAB — FOLATE: Folate: 30 ng/mL (ref >5.9)

## 2021-06-06 LAB — VITAMIN B12: Vitamin B-12: 817 pg/mL (ref 180–914)

## 2021-06-06 LAB — CBC WITH DIFFERENTIAL/PLATELET
Abs Immature Granulocytes: 0.03 10*3/uL (ref 0.00–0.07)
Basophils Absolute: 0 10*3/uL (ref 0.0–0.1)
Basophils Relative: 0 %
Eosinophils Absolute: 0 10*3/uL (ref 0.0–0.5)
Eosinophils Relative: 0 %
HCT: 43.5 % (ref 36.0–46.0)
Hemoglobin: 13.7 g/dL (ref 12.0–15.0)
Immature Granulocytes: 0 %
Lymphocytes Relative: 13 %
Lymphs Abs: 0.9 10*3/uL (ref 0.7–4.0)
MCH: 30 pg (ref 26.0–34.0)
MCHC: 31.5 g/dL (ref 30.0–36.0)
MCV: 95.4 fL (ref 80.0–100.0)
Monocytes Absolute: 0.7 10*3/uL (ref 0.1–1.0)
Monocytes Relative: 10 %
Neutro Abs: 5.2 10*3/uL (ref 1.7–7.7)
Neutrophils Relative %: 77 %
Platelets: 183 10*3/uL (ref 150–400)
RBC: 4.56 MIL/uL (ref 3.87–5.11)
RDW: 12.4 % (ref 11.5–15.5)
WBC: 6.9 10*3/uL (ref 4.0–10.5)
nRBC: 0 % (ref 0.0–0.2)

## 2021-06-06 LAB — CK: Total CK: 114 U/L (ref 38–234)

## 2021-06-06 LAB — RESP PANEL BY RT-PCR (FLU A&B, COVID) ARPGX2
Influenza A by PCR: NEGATIVE
Influenza B by PCR: NEGATIVE
SARS Coronavirus 2 by RT PCR: NEGATIVE

## 2021-06-06 LAB — URINALYSIS, ROUTINE W REFLEX MICROSCOPIC
Bilirubin Urine: NEGATIVE
Glucose, UA: NEGATIVE mg/dL
Hgb urine dipstick: NEGATIVE
Ketones, ur: NEGATIVE mg/dL
Leukocytes,Ua: NEGATIVE
Nitrite: NEGATIVE
Specific Gravity, Urine: 1.02 (ref 1.005–1.030)
pH: 6 (ref 5.0–8.0)

## 2021-06-06 LAB — URINALYSIS, MICROSCOPIC (REFLEX)
Bacteria, UA: NONE SEEN
WBC, UA: NONE SEEN WBC/hpf (ref 0–5)

## 2021-06-06 LAB — MAGNESIUM: Magnesium: 1.8 mg/dL (ref 1.7–2.4)

## 2021-06-06 LAB — VALPROIC ACID LEVEL: Valproic Acid Lvl: <10 ug/mL — ABNORMAL LOW (ref 50.0–100.0)

## 2021-06-06 LAB — AMMONIA: Ammonia: <10 umol/L (ref 9–35)

## 2021-06-06 LAB — CBG MONITORING, ED: Glucose-Capillary: 87 mg/dL (ref 70–99)

## 2021-06-06 LAB — TSH: TSH: 1.705 u[IU]/mL (ref 0.350–4.500)

## 2021-06-06 LAB — PROCALCITONIN: Procalcitonin: 0.13 ng/mL

## 2021-06-06 LAB — T4, FREE: Free T4: 1.13 ng/dL — ABNORMAL HIGH (ref 0.61–1.12)

## 2021-06-06 MED ORDER — SODIUM CHLORIDE 0.9 % IV BOLUS
1000.0000 mL | Freq: Once | INTRAVENOUS | Status: AC
  Administered 2021-06-06: 1000 mL via INTRAVENOUS

## 2021-06-06 MED ORDER — ENSURE ENLIVE PO LIQD
237.0000 mL | Freq: Two times a day (BID) | ORAL | Status: DC
  Administered 2021-06-07 (×2): 237 mL via ORAL

## 2021-06-06 MED ORDER — ONDANSETRON HCL 4 MG/2ML IJ SOLN: 4.0000 mg | Freq: Four times a day (QID) | INTRAMUSCULAR | Status: DC | PRN

## 2021-06-06 MED ORDER — ACETAMINOPHEN 650 MG RE SUPP: 650.0000 mg | Freq: Four times a day (QID) | RECTAL | Status: DC | PRN

## 2021-06-06 MED ORDER — ENOXAPARIN SODIUM 30 MG/0.3ML IJ SOSY
30.0000 mg | PREFILLED_SYRINGE | Freq: Every day | INTRAMUSCULAR | Status: DC
  Administered 2021-06-06: 30 mg via SUBCUTANEOUS
  Filled 2021-06-06: qty 0.3

## 2021-06-06 MED ORDER — SODIUM CHLORIDE 0.9 % IV SOLN: INTRAVENOUS | Status: DC

## 2021-06-06 MED ORDER — ONDANSETRON HCL 4 MG PO TABS: 4.0000 mg | ORAL_TABLET | Freq: Four times a day (QID) | ORAL | Status: DC | PRN

## 2021-06-06 MED ORDER — ACETAMINOPHEN 325 MG PO TABS
650.0000 mg | ORAL_TABLET | Freq: Four times a day (QID) | ORAL | Status: DC | PRN
  Administered 2021-06-06: 650 mg via ORAL
  Filled 2021-06-06: qty 2

## 2021-06-06 MED ORDER — AMLODIPINE BESYLATE 5 MG PO TABS
5.0000 mg | ORAL_TABLET | Freq: Every day | ORAL | Status: DC
  Administered 2021-06-06 – 2021-06-07 (×2): 5 mg via ORAL
  Filled 2021-06-06 (×2): qty 1

## 2021-06-06 MED ORDER — HYDRALAZINE HCL 20 MG/ML IJ SOLN: 10.0000 mg | Freq: Four times a day (QID) | INTRAMUSCULAR | Status: DC | PRN

## 2021-06-06 NOTE — ED Provider Notes (Signed)
[name] EMERGENCY DEPARTMENT Provider Note   CSN: [id number] Arrival date & time: 06/06/21  1020     History Chief Complaint  Patient presents with   Weakness    Kelly Freeman is a 87 y.o. female.  Level 5 caveat secondary to altered mental status and dementia.  Patient is brought in from her memory care unit for generalized weakness.  Patient's granddaughter providing some history.  She last saw her 5 to 6 days ago at her facility, at that time she could walk talk and feed herself.  She saw her again today and found her unresponsive.  It sounds like she has not been eating or drinking for possibly a few days.  She has had her Zoloft recently adjusted.  Had a cold last week and was put on Zithromax.  The history is provided by the EMS personnel and a relative.  Weakness Severity:  Severe Onset quality:  Gradual Duration:  5 days Progression:  Unchanged Chronicity:  New Context: recent infection   Relieved by:  None tried Worsened by:  Nothing Ineffective treatments:  None tried Associated symptoms: cough   Risk factors: neurologic disease       Past Medical History:  Diagnosis Date   Acute cystitis without hematuria    Acute diarrhea    Allergy    Anxiety    Blood transfusion without reported diagnosis 1949   Cancer ([place])    skin cancer on face   Cataract    bilateral   Chronic allergic rhinitis    CKD (chronic kidney disease) stage 3, GFR 30-59 ml/min (HCC)    Cognitive impairment    DDD (degenerative disc disease), lumbar    Dementia (HCC)    Dizziness    E. coli UTI    Fever    Gastroenteritis    GERD (gastroesophageal reflux disease)    Hereditary and idiopathic peripheral neuropathy    History of recurrent UTIs    Hypertension    Hypertension    MGUS (monoclonal gammopathy of unknown significance)    Migraine without aura and without status migrainosus, not intractable    Mixed incontinence urge and stress    Moderate episode of recurrent major  depressive disorder (HCC)    Nausea and vomiting    Obesity    Obstructive sleep apnea    Osteopenia    Recurrent falls    Restless leg syndrome    Sleep apnea    UTI (urinary tract infection)     Patient Active Problem List   Diagnosis Date Noted   DDD (degenerative disc disease), lumbar 05/17/2021   Recurrent UTI 02/01/2021   Dementia with behavioral disturbance 01/04/2021   Pain    Palliative care by specialist    Goals of care, counseling/discussion    Hematoma, nontraumatic, soft tissue 12/18/2020   Fall at home, initial encounter 12/18/2020   History of DVT (deep vein thrombosis) 12/18/2020   Recurrent falls    Moderate episode of recurrent major depressive disorder (HCC)    MGUS (monoclonal gammopathy of unknown significance)    Primary hypertension    History of recurrent UTIs    CKD (chronic kidney disease) stage 3, GFR 30-59 ml/min (HCC)     Past Surgical History:  Procedure Laterality Date   APPENDECTOMY     CHOLECYSTECTOMY     EYE SURGERY     bilateral cataracts removed     OB History   No obstetric history on file.     Family History  Problem Relation Age of Onset   Thrombosis Father 48       Coronary   Stomach cancer Mother 31   Arthritis Son 46   Arthritis Daughter 60   Cirrhosis Daughter 77       Stage 3    Hepatitis C Daughter     Social History   Tobacco Use   Smoking status: Never   Smokeless tobacco: Never  Vaping Use   Vaping Use: Never used  Substance Use Topics   Alcohol use: Never   Drug use: Never    Home Medications Prior to Admission medications   Medication Sig Start Date End Date Taking? Authorizing Provider  acetaminophen (TYLENOL) 325 MG tablet Take 2 tablets (650 mg total) by mouth every 8 (eight) hours. 01/04/21   [name], FNP  azithromycin (ZITHROMAX) 250 MG tablet Take 2 tablets on day 1, then 1 tablet daily on days 2 through 5 06/01/21 06/06/21  [name], FNP  cetirizine (ZYRTEC ALLERGY) 10 MG  tablet Take 0.5 tablets (5 mg total) by mouth daily. 03/04/21   [name], FNP  ferrous sulfate 325 (65 FE) MG tablet Take 325 mg by mouth every morning.    [provider]  furosemide (LASIX) 20 MG tablet Take 1 tablet (20 mg total) by mouth daily. 02/01/21   [name], FNP  furosemide (LASIX) 20 MG tablet Take 1 tablet (20 mg total) by mouth daily for 3 days. Add to usual 20 mg dose totaling 40 mg for next 3 days then return to 20 mg daily 05/17/21 05/20/21  Baruch Gouty, FNP  haloperidol (HALDOL) 2 MG/ML solution Take 2 mg by mouth 2 (two) times daily as needed for agitation.    [provider]  lisinopril (ZESTRIL) 2.5 MG tablet Take 1 tablet (2.5 mg total) by mouth daily. 09/23/19   Lauree Chandler, NP  LORazepam (ATIVAN) 0.5 MG tablet Take 0.25 mg by mouth every 12 (twelve) hours as needed for anxiety (agitation).    [provider]  sertraline (ZOLOFT) 20 MG/ML concentrated solution Take 60 mg by mouth every morning.    [provider]  valproic acid (DEPAKENE) 250 MG/5ML solution Take 125 mg by mouth in the morning and at bedtime.    [provider]  zinc oxide (BALMEX) 11.3 % CREA cream Apply 1 application topically 2 (two) times daily. 01/19/21   [name], FNP    Allergies    Levofloxacin and Gabapentin  Review of Systems   Review of Systems  Unable to perform ROS: Dementia  Respiratory:  Positive for cough.   Neurological:  Positive for weakness.   Physical Exam Updated Vital Signs BP (!) 146/76 (BP Location: Right Arm)   Pulse 60   Temp 97.9 F (36.6 C) (Oral)   Resp 16   Ht 5\' 7"  (1.702 m)   Wt 64 kg   SpO2 97%   BMI 22.10 kg/m   Physical Exam Vitals and nursing note reviewed.  Constitutional:      General: She is not in acute distress.    Appearance: Normal appearance. She is well-developed.  HENT:     Head: Normocephalic and atraumatic.  Eyes:     Conjunctiva/sclera: Conjunctivae normal.   Cardiovascular:     Rate and Rhythm: Normal rate and regular rhythm.     Heart sounds: No murmur heard. Pulmonary:     Effort: Pulmonary effort is normal. No respiratory distress.  Breath sounds: Normal breath sounds.  Abdominal:     Palpations: Abdomen is soft.     Tenderness: There is no abdominal tenderness. There is no guarding or rebound.  Musculoskeletal:        General: No deformity or signs of injury. Normal range of motion.     Cervical back: Neck supple.  Skin:    General: Skin is warm and dry.  Neurological:     General: No focal deficit present.     Mental Status: She is lethargic.     Comments: She will not open her eyes to voice.  She localizes pain.  Moves all extremities although not following commands.  Probable left facial droop.  Leaning towards the left.    ED Results / Procedures / Treatments   Labs (all labs ordered are listed, but only abnormal results are displayed) Labs Reviewed  COMPREHENSIVE METABOLIC PANEL - Abnormal; Notable for the following components:      Result Value   BUN 40 (*)    Creatinine, Ser 1.37 (*)    GFR, Estimated 36 (*)    All other components within normal limits  URINALYSIS, ROUTINE W REFLEX MICROSCOPIC - Abnormal; Notable for the following components:   APPearance HAZY (*)    Protein, ur TRACE (*)    All other components within normal limits  VALPROIC ACID LEVEL - Abnormal; Notable for the following components:   Valproic Acid Lvl <10 (*)    All other components within normal limits  RESP PANEL BY RT-PCR (FLU A&B, COVID) ARPGX2  URINE CULTURE  CBC WITH DIFFERENTIAL/PLATELET  AMMONIA  CK  VITAMIN B12  PROCALCITONIN  FOLATE  TSH  MAGNESIUM  URINALYSIS, MICROSCOPIC (REFLEX)  URINALYSIS, COMPLETE (UACMP) WITH MICROSCOPIC  T4, FREE  MAGNESIUM  BASIC METABOLIC PANEL  CBC  CBG MONITORING, ED    EKG EKG Interpretation  Date/Time:  Monday June 06 2021 12:13:52 EDT Ventricular Rate:  59 PR Interval:  175 QRS  Duration: 101 QT Interval:  441 QTC Calculation: 437 R Axis:   91 Text Interpretation: Sinus rhythm Right axis deviation Probable anteroseptal infarct, old Confirmed by Aletta Edouard [phone]) on 06/06/2021 12:34:05 PM  Radiology CT Head Wo Contrast  Result Date: 06/06/2021 CLINICAL DATA:  Mental status change, unknown cause. EXAM: CT HEAD WITHOUT CONTRAST TECHNIQUE: Contiguous axial images were obtained from the base of the skull through the vertex without intravenous contrast. COMPARISON:  Head CT 05/21/2021 FINDINGS: Brain: Mild generalized cerebral and cerebellar atrophy. Moderate patchy and ill-defined hypoattenuation within the cerebral white matter, nonspecific but compatible with chronic small vessel ischemic disease. Redemonstrated small chronic infarct within the left cerebellar hemisphere. There is no acute intracranial hemorrhage. No demarcated cortical infarct. No extra-axial fluid collection. No evidence of an intracranial mass. No midline shift. Vascular: No hyperdense vessel.  Atherosclerotic calcifications. Skull: Normal. Negative for fracture or focal lesion. Sinuses/Orbits: Visualized orbits show no acute finding. Mucosal thickening and possible fluid within the inferior frontal sinuses bilaterally. Extensive partial opacification of the bilateral ethmoid air cells. Mild mucosal thickening within the right greater than left sphenoid sinuses. Moderate mucosal thickening and moderate volume frothy secretions within the left maxillary sinus. Small volume frothy secretions and mild mucosal thickening within the right maxillary sinus. Other: Anterior subluxation of the right mandibular condyle. IMPRESSION: No evidence of acute intracranial abnormality. Moderate chronic small vessel ischemic changes within the cerebral white matter. Redemonstrated small chronic infarct within the left cerebellar hemisphere. Mild generalized parenchymal atrophy. Anterior subluxation of the right  mandibular  condyle. This may be related to patient positioning at the time of image acquisition. However, correlate with physical exam findings to exclude TMJ dislocation. Paranasal sinus disease, as described. Correlate for acute on chronic sinusitis. Electronically Signed   By: Kellie Simmering D.O.   On: 06/06/2021 14:02   DG Chest Port 1 View  Result Date: 06/06/2021 CLINICAL DATA:  Cough, weakness EXAM: PORTABLE CHEST 1 VIEW COMPARISON:  05/13/2020 FINDINGS: Cardiac silhouette is mildly enlarged. Aorta is calcified and tortuous. Chronically coarsened bibasilar interstitial markings. No focal airspace consolidation. No pleural effusion or pneumothorax. Osseous structures are demineralized. IMPRESSION: Chronically coarsened bibasilar interstitial markings. No focal airspace consolidation. Electronically Signed   By: Davina Poke D.O.   On: 06/06/2021 12:03    Procedures Procedures   Medications Ordered in ED Medications  enoxaparin (LOVENOX) injection 30 mg (30 mg Subcutaneous Given 06/06/21 1842)  acetaminophen (TYLENOL) tablet 650 mg (has no administration in time range)    Or  acetaminophen (TYLENOL) suppository 650 mg (has no administration in time range)  ondansetron (ZOFRAN) tablet 4 mg (has no administration in time range)    Or  ondansetron (ZOFRAN) injection 4 mg (has no administration in time range)  0.9 %  sodium chloride infusion ( Intravenous New Bag/Given 06/06/21 1842)  amLODipine (NORVASC) tablet 5 mg (5 mg Oral Given 06/06/21 1842)  hydrALAZINE (APRESOLINE) injection 10 mg (has no administration in time range)  sodium chloride 0.9 % bolus 1,000 mL (0 mLs Intravenous Stopped 06/06/21 1551)    ED Course  I have reviewed the triage vital signs and the nursing notes.  Pertinent labs & imaging results that were available during my care of the patient were reviewed by me and considered in my medical decision making (see chart for details).  Clinical Course as of 06/06/21 2137  Mon  Jun 06, 2021  1141 Chest x-ray interpreted by me as no clear infiltrate.  Awaiting radiology reading. [MB]    Clinical Course User Index [MB] Hayden Rasmussen, MD   MDM Rules/Calculators/A&P                          This patient complains of lethargy altered mental status; this involves an extensive number of treatment Options and is a complaint that carries with it a high risk of complications and Morbidity. The differential includes dehydration, stroke, bleed, metabolic derangement, infection, hypoglycemia, overmedication  I ordered, reviewed and interpreted labs, which included CBC with normal white count normal hemoglobin, chemistries with mild elevation of BUN/creatinine similar to priors, urinalysis without clear signs of infection, ammonia normal I ordered medication IV fluids I ordered imaging studies which included chest x-ray and head CT and I independently    visualized and interpreted imaging which showed no acute findings Additional history obtained from patient's granddaughter and daughter Previous records obtained and reviewed in epic no recent admissions I consulted [facility] hospitalist Dr Tat and discussed lab and imaging findings  Critical Interventions: None  After the interventions stated above, I reevaluated the patient and found patient still to be symptomatically less responsive.  Will need admission to the hospital for further work-up of her symptoms. Kelly Freeman was evaluated in Emergency Department on 06/06/2021 for the symptoms described in the history of present illness. She was evaluated in the context of the global COVID-19 pandemic, which necessitated consideration that the patient might be at risk for infection with the SARS-CoV-2 virus that causes COVID-19. Institutional protocols and  algorithms that pertain to the evaluation of patients at risk for COVID-19 are in a state of rapid change based on information released by regulatory bodies including the CDC and  federal and state organizations. These policies and algorithms were followed during the patient's care in the ED.   Final Clinical Impression(s) / ED Diagnoses Final diagnoses:  Acute metabolic encephalopathy    Rx / DC Orders ED Discharge Orders     None        Hayden Rasmussen, MD 06/06/21 2140

## 2021-06-06 NOTE — Progress Notes (Signed)
PHARMACIST - PHYSICIAN COMMUNICATION  CONCERNING:  Enoxaparin (Lovenox) for DVT Prophylaxis   RECOMMENDATION: Patient was prescribed enoxaprin 40mg  q24 hours for VTE prophylaxis.   Filed Weights   06/06/21 1032  Weight: 64 kg (141 lb 1.5 oz)    Body mass index is 22.1 kg/m.  Estimated Creatinine Clearance: 25.5 mL/min (A) (by C-G formula based on SCr of 1.37 mg/dL (H)).  Patient is candidate for enoxaparin 30mg  every 24 hours based on CrCl <60ml/min or Weight <45kg  DESCRIPTION: Pharmacy has adjusted enoxaparin dose per [facility] policy.  Patient is now receiving enoxaparin 30 mg every 24 hours    Ena Dawley, PharmD Clinical Pharmacist  06/06/2021 3:55 PM

## 2021-06-06 NOTE — ED Notes (Signed)
Patient transported to CT 

## 2021-06-06 NOTE — ED Notes (Signed)
Pt has dementia and is altered. Unable to sign MSE

## 2021-06-06 NOTE — H&P (Signed)
History and Physical  Kionna Brier RSW:[id number] DOB: Aug 07, 1927 DOA: 06/06/2021   PCP: [name], FNP   Patient coming from: Home  Chief Complaint: altered mental status  HPI:  Erick Oxendine is a 87 y.o. female with medical history of dementia, hypertension, CKD stage III, anxiety/depression, recurrent UTIs, iron deficiency and OSA presenting altered mental status.  The patient normally resides at [facility].  Apparently, the patient's granddaughter visits the patient on a daily basis.  She last visited the patient on 06/01/2021 at which time the patient was at her usual baseline.  Apparently the patient is normally pleasantly confused.  She is normally able to ambulate with a walker, and she is able to feed herself and go to the bathroom on her own.  However, the patient's granddaughter went out of town for several days.  She came back to visit the patient on 06/06/2021.  She found the patient confused and lethargic.  She was "dead weight" and unable to get out of bed even with assistance.  As result, EMS was activated.  There was concern that during these past several days since 06/01/2021 that the patient had not been eating or drinking or taking any of her medications.  However, review of the medical record from [place] shows documentation that the patient has been receiving her medications from 06/01/2021 until the day of this hospitalization.  Nevertheless, the patient's daughter at the bedside who supplements the history states that the patient has had decreased oral intake.  There is been no reports of chest pain, shortness of breath, nausea, vomiting, diarrhea.  Regarding her medications, the patient's daughter states that the patient's sertraline has been recently discontinued.  Review the medical record shows that the patient has been receiving sertraline with the last date of administration on 06/05/2021.  The patient was recently started on a azithromycin on 06/02/2021.  She  has been chronically on cephalexin.  ED In the emergency department, the patient was afebrile hemodynamically stable with oxygen saturation 98% on room air.  The patient awakens to voice.  On examination, the patient continually repeats " do not do that to me" and " leave me alone".  Otherwise the patient is unable to provide any history.  BMP shows sodium 139, potassium 4.1, CO2 29, serum creatinine 1.37.  LFTs were unremarkable.  WBC 6.9, hemoglobin 13.7, platelets 183,000.  EKG shows sinus rhythm with no ST-T wave changes.  Ammonia level was undetectable.  CT of the brain was negative for any acute intracranial findings.  There was a chronic left cerebellar infarct.  Chest x-ray showed chronic interstitial markings. Patient was given 1 L normal saline.  Admission was requested for further work-up and treatment.  Assessment/Plan: Acute Metabolic Encephalopathy -work up in progress -CT brain--neg for acute intracranial finding -UA/urine culture -B12 -TSH -folate -Ammonia <10 -VPA level <10 -Holding Haldol temporarily -Hold Depakote as the patient does not appear to be getting the medication anyway  Essential hypertension -Holding lisinopril in the setting of dehydration -Hydralazine as needed SBP>180 -Holding furosemide  Anxiety/depression -Holding sertraline and Ativan  CKD stage IIIb -Baseline creatinine 1.2-1.3  Dementia -At risk for hospital delirium       Past Medical History:  Diagnosis Date   Acute cystitis without hematuria    Acute diarrhea    Allergy    Anxiety    Blood transfusion without reported diagnosis 1949   Cancer ([place])    skin cancer on face   Cataract  bilateral   Chronic allergic rhinitis    CKD (chronic kidney disease) stage 3, GFR 30-59 ml/min (HCC)    Cognitive impairment    DDD (degenerative disc disease), lumbar    Dementia (HCC)    Dizziness    E. coli UTI    Fever    Gastroenteritis    GERD (gastroesophageal reflux disease)     Hereditary and idiopathic peripheral neuropathy    History of recurrent UTIs    Hypertension    Hypertension    MGUS (monoclonal gammopathy of unknown significance)    Migraine without aura and without status migrainosus, not intractable    Mixed incontinence urge and stress    Moderate episode of recurrent major depressive disorder (HCC)    Nausea and vomiting    Obesity    Obstructive sleep apnea    Osteopenia    Recurrent falls    Restless leg syndrome    Sleep apnea    UTI (urinary tract infection)    Past Surgical History:  Procedure Laterality Date   APPENDECTOMY     CHOLECYSTECTOMY     EYE SURGERY     bilateral cataracts removed   Social History:  reports that she has never smoked. She has never used smokeless tobacco. She reports that she does not drink alcohol and does not use drugs.   Family History  Problem Relation Age of Onset   Thrombosis Father 26       Coronary   Stomach cancer Mother 75   Arthritis Son 55   Arthritis Daughter 58   Cirrhosis Daughter 56       Stage 3    Hepatitis C Daughter      Allergies  Allergen Reactions   Levofloxacin Rash   Gabapentin Other (See Comments)    Other reaction(s): Confusion     Prior to Admission medications   Medication Sig Start Date End Date Taking? Authorizing Provider  acetaminophen (TYLENOL) 325 MG tablet Take 2 tablets (650 mg total) by mouth every 8 (eight) hours. 01/04/21  Yes [name], FNP  azithromycin (ZITHROMAX) 250 MG tablet Take 2 tablets on day 1, then 1 tablet daily on days 2 through 5 06/01/21 06/06/21 Yes [name], FNP  cephALEXin (KEFLEX) 250 MG capsule Take 250 mg by mouth daily.   Yes [provider]  cetirizine (ZYRTEC ALLERGY) 10 MG tablet Take 0.5 tablets (5 mg total) by mouth daily. 03/04/21  Yes [name], FNP  ferrous sulfate 325 (65 FE) MG tablet Take 325 mg by mouth every morning.   Yes [provider]  furosemide (LASIX) 20 MG tablet Take 1  tablet (20 mg total) by mouth daily. 02/01/21  Yes [name], FNP  haloperidol (HALDOL) 2 MG/ML solution Take 1 mg by mouth 2 (two) times daily as needed for agitation.   Yes [provider]  lisinopril (ZESTRIL) 2.5 MG tablet Take 1 tablet (2.5 mg total) by mouth daily. 09/23/19  Yes Lauree Chandler, NP  melatonin 5 MG TABS Take 5 mg by mouth at bedtime.   Yes [provider]  sertraline (ZOLOFT) 20 MG/ML concentrated solution Take 60 mg by mouth every morning.   Yes [provider]  valproic acid (DEPAKENE) 250 MG/5ML solution Take 125 mg by mouth 3 (three) times daily.   Yes [provider]  zinc oxide (BALMEX) 11.3 % CREA cream Apply 1 application topically 2 (two) times daily. 01/19/21  Yes [name], FNP  furosemide (LASIX) 20 MG tablet Take 1 tablet (20 mg total) by mouth daily for 3 days. Add to usual 20 mg dose totaling 40 mg for next 3 days then return to 20 mg daily 05/17/21 05/20/21  Baruch Gouty, FNP  LORazepam (ATIVAN) 0.5 MG tablet Take 0.25 mg by mouth every 12 (twelve) hours as needed for anxiety (agitation). Patient not taking: Reported on 06/06/2021    [provider]    Review of Systems:  Unobtainable due to patient's encephalopathy Physical Exam: Vitals:   06/06/21 1200 06/06/21 1215 06/06/21 1230 06/06/21 1300  BP: (!) 174/72 (!) 174/72 (!) 188/76 (!) 192/78  Pulse: (!) 45 61 62 62  Resp: 13 14 19 16   Temp:      TempSrc:      SpO2: 95% 96% 99% 94%  Weight:      Height:       General:  A&O x 1, NAD, nontoxic, pleasant/cooperative Head/Eye: No conjunctival hemorrhage, no icterus, [place]/AT, No nystagmus ENT:  No icterus,  No thrush, good dentition, no pharyngeal exudate Neck:  No masses, no lymphadenpathy, no bruits CV:  RRR, no rub, no gallop, no S3 Lung:  poor inspiratory effort.  Bibasilar crackles Abdomen: soft/NT, +BS, nondistended, no peritoneal signs Ext: No cyanosis, No rashes, No petechiae, No  lymphangitis, 1+ LE edema   Labs on Admission:  Basic Metabolic Panel: Recent Labs  Lab 06/06/21 1138  NA 139  K 4.1  CL 102  CO2 29  GLUCOSE 96  BUN 40*  CREATININE 1.37*  CALCIUM 9.5   Liver Function Tests: Recent Labs  Lab 06/06/21 1138  AST 35  ALT 18  ALKPHOS 64  BILITOT 0.6  PROT 7.3  ALBUMIN 3.7   No results for input(s): LIPASE, AMYLASE in the last 168 hours. Recent Labs  Lab 06/06/21 1140  AMMONIA <10   CBC: Recent Labs  Lab 06/06/21 1138  WBC 6.9  NEUTROABS 5.2  HGB 13.7  HCT 43.5  MCV 95.4  PLT 183   Coagulation Profile: No results for input(s): INR, PROTIME in the last 168 hours. Cardiac Enzymes: No results for input(s): CKTOTAL, CKMB, CKMBINDEX, TROPONINI in the last 168 hours. BNP: Invalid input(s): POCBNP CBG: Recent Labs  Lab 06/06/21 1127  GLUCAP 87   Urine analysis:    Component Value Date/Time   COLORURINE STRAW (A) 02/02/2021 1046   APPEARANCEUR Clear 04/11/2021 1051   LABSPEC 1.011 02/02/2021 1046   PHURINE 7.0 02/02/2021 1046   GLUCOSEU Negative 04/11/2021 1051   HGBUR NEGATIVE 02/02/2021 1046   BILIRUBINUR Negative 04/11/2021 1051   KETONESUR NEGATIVE 02/02/2021 1046   PROTEINUR Negative 04/11/2021 1051   PROTEINUR NEGATIVE 02/02/2021 1046   NITRITE Negative 04/11/2021 1051   NITRITE NEGATIVE 02/02/2021 1046   LEUKOCYTESUR Negative 04/11/2021 1051   LEUKOCYTESUR NEGATIVE 02/02/2021 1046   Sepsis Labs: @LABRCNTIP (procalcitonin:4,lacticidven:4) ) Recent Results (from the past 240 hour(s))  Resp Panel by RT-PCR (Flu A&B, Covid) Nasopharyngeal Swab     Status: None   Collection Time: 06/06/21 11:15 AM   Specimen: Nasopharyngeal Swab; Nasopharyngeal(NP) swabs in vial transport medium  Result Value Ref Range Status   SARS Coronavirus 2 by RT PCR NEGATIVE NEGATIVE Final    Comment: (NOTE) SARS-CoV-2 target nucleic acids are NOT DETECTED.  The SARS-CoV-2 RNA is generally detectable in upper respiratory specimens  during the acute phase of infection. The lowest concentration of SARS-CoV-2 viral copies this assay can detect is 138 copies/mL. A negative result does not preclude SARS-Cov-2  infection and should not be used as the sole basis for treatment or other patient management decisions. A negative result may occur with  improper specimen collection/handling, submission of specimen other than nasopharyngeal swab, presence of viral mutation(s) within the areas targeted by this assay, and inadequate number of viral copies(<138 copies/mL). A negative result must be combined with clinical observations, patient history, and epidemiological information. The expected result is Negative.  Fact Sheet for Patients:  EntrepreneurPulse.com.au  Fact Sheet for Healthcare Providers:  IncredibleEmployment.be  This test is no t yet approved or cleared by the [place] FDA and  has been authorized for detection and/or diagnosis of SARS-CoV-2 by FDA under an Emergency Use Authorization (EUA). This EUA will remain  in effect (meaning this test can be used) for the duration of the COVID-19 declaration under Section 564(b)(1) of the Act, 21 U.S.C.section 360bbb-3(b)(1), unless the authorization is terminated  or revoked sooner.       Influenza A by PCR NEGATIVE NEGATIVE Final   Influenza B by PCR NEGATIVE NEGATIVE Final    Comment: (NOTE) The Xpert Xpress SARS-CoV-2/FLU/RSV plus assay is intended as an aid in the diagnosis of influenza from Nasopharyngeal swab specimens and should not be used as a sole basis for treatment. Nasal washings and aspirates are unacceptable for Xpert Xpress SARS-CoV-2/FLU/RSV testing.  Fact Sheet for Patients: EntrepreneurPulse.com.au  Fact Sheet for Healthcare Providers: IncredibleEmployment.be  This test is not yet approved or cleared by the [place] FDA and has been authorized for detection  and/or diagnosis of SARS-CoV-2 by FDA under an Emergency Use Authorization (EUA). This EUA will remain in effect (meaning this test can be used) for the duration of the COVID-19 declaration under Section 564(b)(1) of the Act, 21 U.S.C. section 360bbb-3(b)(1), unless the authorization is terminated or revoked.  Performed at [facility], [address] [place], [place] [address]      Radiological Exams on Admission: CT Head Wo Contrast  Result Date: 06/06/2021 CLINICAL DATA:  Mental status change, unknown cause. EXAM: CT HEAD WITHOUT CONTRAST TECHNIQUE: Contiguous axial images were obtained from the base of the skull through the vertex without intravenous contrast. COMPARISON:  Head CT 05/21/2021 FINDINGS: Brain: Mild generalized cerebral and cerebellar atrophy. Moderate patchy and ill-defined hypoattenuation within the cerebral white matter, nonspecific but compatible with chronic small vessel ischemic disease. Redemonstrated small chronic infarct within the left cerebellar hemisphere. There is no acute intracranial hemorrhage. No demarcated cortical infarct. No extra-axial fluid collection. No evidence of an intracranial mass. No midline shift. Vascular: No hyperdense vessel.  Atherosclerotic calcifications. Skull: Normal. Negative for fracture or focal lesion. Sinuses/Orbits: Visualized orbits show no acute finding. Mucosal thickening and possible fluid within the inferior frontal sinuses bilaterally. Extensive partial opacification of the bilateral ethmoid air cells. Mild mucosal thickening within the right greater than left sphenoid sinuses. Moderate mucosal thickening and moderate volume frothy secretions within the left maxillary sinus. Small volume frothy secretions and mild mucosal thickening within the right maxillary sinus. Other: Anterior subluxation of the right mandibular condyle. IMPRESSION: No evidence of acute intracranial abnormality. Moderate chronic small vessel ischemic changes  within the cerebral white matter. Redemonstrated small chronic infarct within the left cerebellar hemisphere. Mild generalized parenchymal atrophy. Anterior subluxation of the right mandibular condyle. This may be related to patient positioning at the time of image acquisition. However, correlate with physical exam findings to exclude TMJ dislocation. Paranasal sinus disease, as described. Correlate for acute on chronic sinusitis. Electronically Signed   By: Marylyn Ishihara  Armandina Gemma D.O.   On: 06/06/2021 14:02   DG Chest Port 1 View  Result Date: 06/06/2021 CLINICAL DATA:  Cough, weakness EXAM: PORTABLE CHEST 1 VIEW COMPARISON:  05/13/2020 FINDINGS: Cardiac silhouette is mildly enlarged. Aorta is calcified and tortuous. Chronically coarsened bibasilar interstitial markings. No focal airspace consolidation. No pleural effusion or pneumothorax. Osseous structures are demineralized. IMPRESSION: Chronically coarsened bibasilar interstitial markings. No focal airspace consolidation. Electronically Signed   By: Davina Poke D.O.   On: 06/06/2021 12:03    EKG: Independently reviewed. Sinus, no STT change    Time spent:60 minutes Code Status:   DNR Family Communication:  Daughter at bedside 10/10 Disposition Plan: expect 2day hospitalization Consults called: none DVT Prophylaxis: [place] Lovenox  Orson Eva, DO  [facility] Pager [phone]  If 7PM-7AM, please contact night-coverage [email or web address] Password [email or web address] 06/06/2021, 3:16 PM

## 2021-06-06 NOTE — ED Triage Notes (Signed)
Pt arrived by [facility] from [facility] for "dehydration and not acting herself." Per staff at facility. Pt has history of dementia.

## 2021-06-07 DIAGNOSIS — G9341 Metabolic encephalopathy: Secondary | ICD-10-CM | POA: Diagnosis not present

## 2021-06-07 DIAGNOSIS — N1832 Chronic kidney disease, stage 3b: Secondary | ICD-10-CM | POA: Diagnosis not present

## 2021-06-07 LAB — CBC
HCT: 39 % (ref 36.0–46.0)
Hemoglobin: 12.7 g/dL (ref 12.0–15.0)
MCH: 31 pg (ref 26.0–34.0)
MCHC: 32.6 g/dL (ref 30.0–36.0)
MCV: 95.1 fL (ref 80.0–100.0)
Platelets: 187 10*3/uL (ref 150–400)
RBC: 4.1 MIL/uL (ref 3.87–5.11)
RDW: 12.5 % (ref 11.5–15.5)
WBC: 8.1 10*3/uL (ref 4.0–10.5)
nRBC: 0 % (ref 0.0–0.2)

## 2021-06-07 LAB — BASIC METABOLIC PANEL
Anion gap: 6 (ref 5–15)
BUN: 31 mg/dL — ABNORMAL HIGH (ref 8–23)
CO2: 27 mmol/L (ref 22–32)
Calcium: 8.6 mg/dL — ABNORMAL LOW (ref 8.9–10.3)
Chloride: 105 mmol/L (ref 98–111)
Creatinine, Ser: 1.1 mg/dL — ABNORMAL HIGH (ref 0.44–1.00)
GFR, Estimated: 47 mL/min — ABNORMAL LOW (ref >60)
Glucose, Bld: 86 mg/dL (ref 70–99)
Potassium: 4.4 mmol/L (ref 3.5–5.1)
Sodium: 138 mmol/L (ref 135–145)

## 2021-06-07 LAB — MAGNESIUM: Magnesium: 1.7 mg/dL (ref 1.7–2.4)

## 2021-06-07 MED ORDER — ENOXAPARIN SODIUM 40 MG/0.4ML IJ SOSY: 40.0000 mg | PREFILLED_SYRINGE | Freq: Every day | INTRAMUSCULAR | Status: DC

## 2021-06-07 MED ORDER — MAGNESIUM OXIDE -MG SUPPLEMENT 400 (240 MG) MG PO TABS
400.0000 mg | ORAL_TABLET | Freq: Every day | ORAL | Status: DC
  Administered 2021-06-07: 400 mg via ORAL
  Filled 2021-06-07: qty 1

## 2021-06-07 MED ORDER — AMLODIPINE BESYLATE 5 MG PO TABS: 5.0000 mg | ORAL_TABLET | Freq: Every day | ORAL | 1 refills | Status: DC

## 2021-06-07 MED ORDER — MAGNESIUM SULFATE 2 GM/50ML IV SOLN
2.0000 g | Freq: Once | INTRAVENOUS | Status: AC
  Administered 2021-06-07: 2 g via INTRAVENOUS
  Filled 2021-06-07: qty 50

## 2021-06-07 MED ORDER — MAGNESIUM OXIDE -MG SUPPLEMENT 400 (240 MG) MG PO TABS: 400.0000 mg | ORAL_TABLET | Freq: Every day | ORAL | 1 refills | Status: DC

## 2021-06-07 NOTE — Evaluation (Signed)
Physical Therapy Evaluation Patient Details Name: Carsyn Boster MRN: [id number] DOB: Apr 29, 1927 Today's Date: 06/07/2021  History of Present Illness  Deshaun Schou is a 87 y.o. female presents with AMS. CT brain negative for acute intracranial findings. PMH: dementia, HTN, CKD, anxiety/depression   Clinical Impression  Pt admitted with above diagnosis. Pt's daughter and granddaughter providing all information, pt oriented to self and aware she is not at home. Per family, pt was ambulating with rollator, completing self care tasks without assist; states facility has w/c available as needed. Pt currently requiring mod A with bed mobility, min A-min guard +2 for transfers and steps at bedside. Pt is pleasantly confused limiting mobility, using initiation cues and granddaughters cues to promote mobility with good results. Goal is to return to ALF with PT per family. Pt currently with functional limitations due to the deficits listed below (see PT Problem List). Pt will benefit from skilled PT to increase their independence and safety with mobility to allow discharge to the venue listed below.          Recommendations for follow up therapy are one component of a multi-disciplinary discharge planning process, led by the attending physician.  Recommendations may be updated based on patient status, additional functional criteria and insurance authorization.  Follow Up Recommendations SNF (ALF with PT)    Equipment Recommendations  None recommended by PT    Recommendations for Other Services       Precautions / Restrictions Precautions Precautions: Fall Precaution Comments: dementia Restrictions Weight Bearing Restrictions: No      Mobility  Bed Mobility Overal bed mobility: Needs Assistance Bed Mobility: Supine to Sit;Sit to Supine  Supine to sit: Mod assist;HOB elevated Sit to supine: Mod assist General bed mobility comments: mod A with use of bedpad to come to upright trunk and clear  BLE from EOB, mod A to lift BLE back into bed, increased time and max VCs    Transfers Overall transfer level: Needs assistance Equipment used: Rolling walker (2 wheeled);2 person hand held assist Transfers: Sit to/from [facility] Sit to Stand: Min assist;+2 physical assistance Stand pivot transfers: Min guard;+2 physical assistance  General transfer comment: min A +2 with RW and bil HHA for comfort, initiation cues to encourage powering to stand, initial rep with weight posterior progress to improved steadiness with 3rd rep; min guard +2 with bil HHA with pivoting to recliner, pt able to take short, slow steps to pivot without LOB  Ambulation/Gait Ambulation/Gait assistance: Min guard;+2 physical assistance  Assistive device: 2 person hand held assist Gait Pattern/deviations: Step-to pattern;Decreased stride length Gait velocity: decreased General Gait Details: pt able to clear bil feet taking short, slow steps with bil HHA, no LOB but very hesitant, responds well to granddaughters cues  Stairs            Wheelchair Mobility    Modified Rankin (Stroke Patients Only)       Balance Overall balance assessment: Needs assistance Sitting-balance support: Feet supported Sitting balance-Leahy Scale: Fair Sitting balance - Comments: seated EOB   Standing balance support: During functional activity;Bilateral upper extremity supported Standing balance-Leahy Scale: Poor Standing balance comment: reliant on UE support          Pertinent Vitals/Pain Pain Assessment: Faces Faces Pain Scale: Hurts little more Pain Location: bottom Pain Descriptors / Indicators: Sore Pain Intervention(s): Limited activity within patient's tolerance;Monitored during session;Repositioned    Home Living Family/patient expects to be discharged to:: Assisted living ([facility])  Home Equipment: Gilford Rile -  4 wheels      Prior Function Level of Independence: Needs assistance   Gait  / Transfers Assistance Needed: pt ambulates with rollator and granddaughter assisting as needed  ADL's / Homemaking Assistance Needed: pt completing self care tasks  Comments: Granddaughter reports facility able to assist pt in/out of bed and transfer, nursing can provide w/c for pt, has had success with HHPT in the past.     Hand Dominance   Dominant Hand: Right    Extremity/Trunk Assessment   Upper Extremity Assessment Upper Extremity Assessment: Overall WFL for tasks assessed    Lower Extremity Assessment Lower Extremity Assessment: Generalized weakness (AROM WNL, strength grossly 4-/5, symmetrical)    Cervical / Trunk Assessment Cervical / Trunk Assessment: Normal  Communication   Communication: HOH  Cognition Arousal/Alertness: Awake/alert Behavior During Therapy: Anxious Overall Cognitive Status: History of cognitive impairments - at baseline  General Comments: Pt repeating "I wan't to go home", able to follow commands with increased time and encouragement, pleasant, family assisting to encourage and soothe pt during session      General Comments      Exercises     Assessment/Plan    PT Assessment Patient needs continued PT services  PT Problem List Decreased strength;Decreased activity tolerance;Decreased balance;Decreased mobility;Decreased cognition;Decreased knowledge of use of DME;Decreased safety awareness;Pain       PT Treatment Interventions DME instruction;Gait training;Functional mobility training;Therapeutic activities;Therapeutic exercise;Balance training;Patient/family education    PT Goals (Current goals can be found in the Care Plan section)  Acute Rehab PT Goals Patient Stated Goal: family states goal is to return to ALF with HHPT PT Goal Formulation: With family Time For Goal Achievement: 06/21/21 Potential to Achieve Goals: Fair    Frequency Min 3X/week   Barriers to discharge        Co-evaluation               AM-PAC PT "6  Clicks" Mobility  Outcome Measure Help needed turning from your back to your side while in a flat bed without using bedrails?: A Lot Help needed moving from lying on your back to sitting on the side of a flat bed without using bedrails?: A Lot Help needed moving to and from a bed to a chair (including a wheelchair)?: A Little Help needed standing up from a chair using your arms (e.g., wheelchair or bedside chair)?: A Little Help needed to walk in hospital room?: A Little Help needed climbing 3-5 steps with a railing? : A Lot 6 Click Score: 15    End of Session Equipment Utilized During Treatment: Gait belt Activity Tolerance: Patient tolerated treatment well Patient left: in chair;with call bell/phone within reach;with nursing/sitter in room;with family/visitor present Nurse Communication: Mobility status PT Visit Diagnosis: Unsteadiness on feet (R26.81);Other abnormalities of gait and mobility (R26.89);Muscle weakness (generalized) (M62.81)    Time: 1024-1100 PT Time Calculation (min) (ACUTE ONLY): 36 min   Charges:   PT Evaluation $PT Eval Moderate Complexity: 1 Mod PT Treatments $Therapeutic Activity: 8-22 mins         Tori Franca Stakes PT, DPT 06/07/21, 12:44 PM

## 2021-06-07 NOTE — Plan of Care (Signed)
  Problem: Acute Rehab PT Goals(only PT should resolve) Goal: Pt Will Go Supine/Side To Sit Outcome: Progressing Flowsheets (Taken 06/07/2021 1246) Pt will go Supine/Side to Sit: with min guard assist Goal: Patient Will Transfer Sit To/From Stand Outcome: Progressing Flowsheets (Taken 06/07/2021 1246) Patient will transfer sit to/from stand: with min guard assist Goal: Pt Will Transfer Bed To Chair/Chair To Bed Outcome: Progressing Flowsheets (Taken 06/07/2021 1246) Pt will Transfer Bed to Chair/Chair to Bed: min guard assist Goal: Pt Will Ambulate Outcome: Progressing Flowsheets (Taken 06/07/2021 1246) Pt will Ambulate:  100 feet  with min guard assist  with rolling walker   Tori Rhet Rorke PT, DPT 06/07/21, 12:47 PM

## 2021-06-07 NOTE — NC FL2 (Signed)
[place] LEVEL OF CARE SCREENING TOOL     IDENTIFICATION  Patient Name: Kelly Freeman Birthdate: 1927/07/05 Sex: female Admission Date (Current Location): 06/06/2021  [facility] and [place] Number:  [employer] and Address:  [place] [address], [place]      Provider Number: [id number]  Attending Physician Name and Address:  Orson Eva, MD  Relative Name and Phone Number:  Janyth, Riera (Daughter) [phone]    Current Level of Care: Hospital Recommended Level of Care: [place], Memory Care Prior Approval Number:    Date Approved/Denied:   PASRR Number:    Discharge Plan: Other (Comment) (ALF/Memory Care)    Current Diagnoses: Patient Active Problem List   Diagnosis Date Noted   Acute metabolic encephalopathy 62/83/1517   Stage 3b chronic kidney disease (CKD) (Max) 06/06/2021   DDD (degenerative disc disease), lumbar 05/17/2021   Recurrent UTI 02/01/2021   Dementia with behavioral disturbance 01/04/2021   Pain    Palliative care by specialist    Goals of care, counseling/discussion    Hematoma, nontraumatic, soft tissue 12/18/2020   Fall at home, initial encounter 12/18/2020   History of DVT (deep vein thrombosis) 12/18/2020   Recurrent falls    Moderate episode of recurrent major depressive disorder (HCC)    MGUS (monoclonal gammopathy of unknown significance)    Primary hypertension    History of recurrent UTIs    CKD (chronic kidney disease) stage 3, GFR 30-59 ml/min (HCC)     Orientation RESPIRATION BLADDER Height & Weight     Self  Normal Incontinent, External catheter Weight: 141 lb 1.5 oz (64 kg) Height:  5\' 7"  (170.2 cm)  BEHAVIORAL SYMPTOMS/MOOD NEUROLOGICAL BOWEL NUTRITION STATUS      Continent Diet  AMBULATORY STATUS COMMUNICATION OF NEEDS Skin   Limited Assist Verbally Normal                       Personal Care Assistance Level of Assistance  Bathing, Feeding,  Dressing Bathing Assistance: Limited assistance Feeding assistance: Independent Dressing Assistance: Limited assistance     Functional Limitations Info  Sight, Hearing, Speech Sight Info: Adequate Hearing Info: Impaired Speech Info: Adequate    SPECIAL CARE FACTORS FREQUENCY  PT (By licensed PT)     PT Frequency: Home Health Physical Therapy              Contractures Contractures Info: Not present    Additional Factors Info  Code Status, Allergies Code Status Info: DNR Allergies Info: Levofloxacin and gabapentin           Current Medications (06/07/2021):  This is the current hospital active medication list Current Facility-Administered Medications  Medication Dose Route Frequency Provider Last Rate Last Admin   0.9 %  sodium chloride infusion   Intravenous Continuous Tat, David, MD 75 mL/hr at 06/07/21 1047 New Bag at 06/07/21 1047   acetaminophen (TYLENOL) tablet 650 mg  650 mg Oral Q6H PRN Tat, Shanon Brow, MD   650 mg at 06/06/21 2329   Or   acetaminophen (TYLENOL) suppository 650 mg  650 mg Rectal Q6H PRN Tat, Shanon Brow, MD       amLODipine (NORVASC) tablet 5 mg  5 mg Oral Daily Tat, David, MD   5 mg at 06/07/21 1020   enoxaparin (LOVENOX) injection 40 mg  40 mg Subcutaneous q1800 Tat, David, MD       feeding supplement (ENSURE ENLIVE / ENSURE PLUS) liquid 237 mL  237 mL Oral BID BM Tat, Shanon Brow, MD   237 mL at 06/07/21 1438   hydrALAZINE (APRESOLINE) injection 10 mg  10 mg Intravenous Q6H PRN Tat, Shanon Brow, MD       magnesium oxide (MAG-OX) tablet 400 mg  400 mg Oral Daily Tat, Shanon Brow, MD       ondansetron [facility]) tablet 4 mg  4 mg Oral Q6H PRN Tat, Shanon Brow, MD       Or   ondansetron (ZOFRAN) injection 4 mg  4 mg Intravenous Q6H PRN Tat, Shanon Brow, MD         Discharge Medications: STOP taking these medications     azithromycin 250 MG tablet Commonly known as: ZITHROMAX    lisinopril 2.5 MG tablet Commonly known as: ZESTRIL    LORazepam 0.5 MG tablet Commonly known as:  ATIVAN           TAKE these medications     acetaminophen 325 MG tablet Commonly known as: TYLENOL Take 2 tablets (650 mg total) by mouth every 8 (eight) hours.    amLODipine 5 MG tablet Commonly known as: NORVASC Take 1 tablet (5 mg total) by mouth daily. Start taking on: June 08, 2021    cephALEXin 250 MG capsule Commonly known as: KEFLEX Take 250 mg by mouth daily.    cetirizine 10 MG tablet Commonly known as: ZyrTEC Allergy Take 0.5 tablets (5 mg total) by mouth daily.    ferrous sulfate 325 (65 FE) MG tablet Take 325 mg by mouth every morning.    furosemide 20 MG tablet Commonly known as: LASIX Take 1 tablet (20 mg total) by mouth daily. What changed: Another medication with the same name was removed. Continue taking this medication, and follow the directions you see here.    haloperidol 2 MG/ML solution Commonly known as: HALDOL Take 1 mg by mouth 2 (two) times daily as needed for agitation.    magnesium oxide 400 (240 Mg) MG tablet Commonly known as: MAG-OX Take 1 tablet (400 mg total) by mouth daily.    melatonin 5 MG Tabs Take 5 mg by mouth at bedtime.    sertraline 20 MG/ML concentrated solution Commonly known as: ZOLOFT Take 60 mg by mouth every morning.    valproic acid 250 MG/5ML solution Commonly known as: DEPAKENE Take 125 mg by mouth 3 (three) times daily.    zinc oxide 11.3 % Crea cream Commonly known as: BALMEX Apply 1 application topically 2 (two) times daily.    Relevant Imaging Results:  Relevant Lab Results:   Additional Information SSN: [id number]  Iona Beard, [place]

## 2021-06-07 NOTE — TOC Transition Note (Signed)
Transition of Care [facility]) - CM/SW Discharge Note   Patient Details  Name: Kelly Freeman MRN: [id number] Date of Birth: 06-Oct-1926  [facility] CM/SW Contact:  Iona Beard, [place] Phone Number: 06/07/2021, 2:52 PM   Clinical Narrative:    CSW spoke to Isaiah Blakes with [facility] who states that pt is a resident in the memory care unit. Pt needs assistance with bathing and getting to the bathroom. Juliann Pulse states pt ca return to the facility once medically stable. They will need an updated Fl2 completed and faxed over. CSW to fax Fl2 and D/C summary. CSW reached out to [place] with [facility] who states pt has been active with them, CSW updated that pt is d/c today and HH PT orders have been placed. CSW spoke with pts daughter and granddaughter in room who state that they feel comfortable transporting pt to the facility. Juliann Pulse with the facility states this is fine as their transport is out for the day. TOC signing off.   Final next level of care: [place] Barriers to Discharge: Barriers Resolved   Patient Goals and CMS Choice Patient states their goals for this hospitalization and ongoing recovery are:: Return to ALF with [facility] CMS Medicare.gov Compare Post Acute Care list provided to:: Patient Represenative (must comment) Choice offered to / list presented to : Adult Children  Discharge Placement                Patient to be transferred to facility by: Family Name of family member notified: Danny Zimny Patient and family notified of of transfer: 06/07/21  Discharge Plan and Services                          HH Arranged: PT [facility] Agency: [place] Date [place]: 06/07/21   Representative spoke with at [place]: [place] ([place]) Interventions     Readmission Risk Interventions No flowsheet data found.

## 2021-06-07 NOTE — Discharge Summary (Addendum)
Physician Discharge Summary  Barry Culverhouse PPI:[id number] DOB: 1927/07/23 DOA: 06/06/2021  PCP: [name], FNP  Admit date: 06/06/2021 Discharge date: 06/07/2021  Admitted From: ALF Disposition:  ALF  Recommendations for Outpatient Follow-up:  Follow up with PCP in 1-2 weeks Please obtain BMP/CBC in one week   Home Health: HHPT   Discharge Condition: Stable CODE STATUS:  DNR Diet recommendation: Heart Healthy    Brief/Interim Summary:  87 y.o. female with medical history of dementia, hypertension, CKD stage III, anxiety/depression, recurrent UTIs, iron deficiency and OSA presenting altered mental status.  The patient normally resides at [facility].  Apparently, the patient's granddaughter visits the patient on a daily basis.  She last visited the patient on 06/01/2021 at which time the patient was at her usual baseline.  Apparently the patient is normally pleasantly confused.  She is normally able to ambulate with a walker, and she is able to feed herself and go to the bathroom on her own.  However, the patient's granddaughter went out of town for several days.  She came back to visit the patient on 06/06/2021.  She found the patient confused and lethargic.  She was "dead weight" and unable to get out of bed even with assistance.  As result, EMS was activated.  There was concern that during these past several days since 06/01/2021 that the patient had not been eating or drinking or taking any of her medications.  However, review of the medical record from [name] shows documentation that the patient has been receiving her medications from 06/01/2021 until the day of this hospitalization.  Nevertheless, the patient's daughter at the bedside who supplements the history states that the patient has had decreased oral intake.  There is been no reports of chest pain, shortness of breath, nausea, vomiting, diarrhea.  Regarding her medications, the patient's daughter states that the  patient's sertraline has been recently discontinued.  Review the medical record shows that the patient has been receiving sertraline with the last date of administration on 06/05/2021.  The patient was recently started on a azithromycin on 06/02/2021.  She has been chronically on cephalexin.   ED In the emergency department, the patient was afebrile hemodynamically stable with oxygen saturation 98% on room air.  The patient awakens to voice.  On examination, the patient continually repeats " do not do that to me" and " leave me alone".  Otherwise the patient is unable to provide any history.  BMP shows sodium 139, potassium 4.1, CO2 29, serum creatinine 1.37.  LFTs were unremarkable.  WBC 6.9, hemoglobin 13.7, platelets 183,000.  EKG shows sinus rhythm with no ST-T wave changes.  Ammonia level was undetectable.  CT of the brain was negative for any acute intracranial findings.  There was a chronic left cerebellar infarct.  Chest x-ray showed chronic interstitial markings. Patient was given 1 L normal saline.  Admission was requested for further work-up and treatment.   Discharge Diagnoses:   Acute Metabolic Encephalopathy -due to dehydration -CT brain--neg for acute intracranial finding -UA/--neg for pyuria -B12--817 -TSH--1.705 -folate--30 -Ammonia <10 -VPA level <10 -Holding haldol and depakene temporarily>>restart after d/c -patient is back to baseline (A&O x 1) on 06/07/21 confirmed by daughter and granddaughter--pt follows one step commands and remains pleasantly confused at baseline and is able to ambulate   Essential hypertension -Holding lisinopril in the setting of dehydration -Hydralazine as needed SBP>180 -Holding furosemide>>restart after dc -d/c lisinopril due to risk of dehydration and AKI -started amlodipine   Anxiety/depression -  Holding sertraline >>restart once daily   CKD stage IIIb -Baseline creatinine 1.2-1.3 -serum creatinine 1.10 on day of d/c   Dementia -At  risk for hospital delirium    Discharge Instructions   Allergies as of 06/07/2021       Reactions   Levofloxacin Rash   Gabapentin Other (See Comments)   Other reaction(s): Confusion        Medication List     STOP taking these medications    azithromycin 250 MG tablet Commonly known as: ZITHROMAX   lisinopril 2.5 MG tablet Commonly known as: ZESTRIL   LORazepam 0.5 MG tablet Commonly known as: ATIVAN       TAKE these medications    acetaminophen 325 MG tablet Commonly known as: TYLENOL Take 2 tablets (650 mg total) by mouth every 8 (eight) hours.   amLODipine 5 MG tablet Commonly known as: NORVASC Take 1 tablet (5 mg total) by mouth daily. Start taking on: June 08, 2021   cephALEXin 250 MG capsule Commonly known as: KEFLEX Take 250 mg by mouth daily.   cetirizine 10 MG tablet Commonly known as: ZyrTEC Allergy Take 0.5 tablets (5 mg total) by mouth daily.   ferrous sulfate 325 (65 FE) MG tablet Take 325 mg by mouth every morning.   furosemide 20 MG tablet Commonly known as: LASIX Take 1 tablet (20 mg total) by mouth daily. What changed: Another medication with the same name was removed. Continue taking this medication, and follow the directions you see here.   haloperidol 2 MG/ML solution Commonly known as: HALDOL Take 1 mg by mouth 2 (two) times daily as needed for agitation.   magnesium oxide 400 (240 Mg) MG tablet Commonly known as: MAG-OX Take 1 tablet (400 mg total) by mouth daily.   melatonin 5 MG Tabs Take 5 mg by mouth at bedtime.   sertraline 20 MG/ML concentrated solution Commonly known as: ZOLOFT Take 60 mg by mouth every morning.   valproic acid 250 MG/5ML solution Commonly known as: DEPAKENE Take 125 mg by mouth 3 (three) times daily.   zinc oxide 11.3 % Crea cream Commonly known as: BALMEX Apply 1 application topically 2 (two) times daily.        Allergies  Allergen Reactions   Levofloxacin Rash   Gabapentin  Other (See Comments)    Other reaction(s): Confusion    Consultations: none   Procedures/Studies: DG Pelvis 1-2 Views  Result Date: 05/21/2021 CLINICAL DATA:  Left leg pain after fall. EXAM: PELVIS - 1-2 VIEW COMPARISON:  May 13, 2020. FINDINGS: There is no evidence of pelvic fracture or diastasis. No pelvic bone lesions are seen. Moderate degenerative changes are seen involving both hip joints. IMPRESSION: No acute abnormality is noted. Electronically Signed   By: Marijo Conception M.D.   On: 05/21/2021 17:51   CT Head Wo Contrast  Result Date: 06/06/2021 CLINICAL DATA:  Mental status change, unknown cause. EXAM: CT HEAD WITHOUT CONTRAST TECHNIQUE: Contiguous axial images were obtained from the base of the skull through the vertex without intravenous contrast. COMPARISON:  Head CT 05/21/2021 FINDINGS: Brain: Mild generalized cerebral and cerebellar atrophy. Moderate patchy and ill-defined hypoattenuation within the cerebral white matter, nonspecific but compatible with chronic small vessel ischemic disease. Redemonstrated small chronic infarct within the left cerebellar hemisphere. There is no acute intracranial hemorrhage. No demarcated cortical infarct. No extra-axial fluid collection. No evidence of an intracranial mass. No midline shift. Vascular: No hyperdense vessel.  Atherosclerotic calcifications. Skull: Normal. Negative for fracture  or focal lesion. Sinuses/Orbits: Visualized orbits show no acute finding. Mucosal thickening and possible fluid within the inferior frontal sinuses bilaterally. Extensive partial opacification of the bilateral ethmoid air cells. Mild mucosal thickening within the right greater than left sphenoid sinuses. Moderate mucosal thickening and moderate volume frothy secretions within the left maxillary sinus. Small volume frothy secretions and mild mucosal thickening within the right maxillary sinus. Other: Anterior subluxation of the right mandibular condyle.  IMPRESSION: No evidence of acute intracranial abnormality. Moderate chronic small vessel ischemic changes within the cerebral white matter. Redemonstrated small chronic infarct within the left cerebellar hemisphere. Mild generalized parenchymal atrophy. Anterior subluxation of the right mandibular condyle. This may be related to patient positioning at the time of image acquisition. However, correlate with physical exam findings to exclude TMJ dislocation. Paranasal sinus disease, as described. Correlate for acute on chronic sinusitis. Electronically Signed   By: Kellie Simmering D.O.   On: 06/06/2021 14:02   CT HEAD WO CONTRAST (5MM)  Result Date: 05/21/2021 CLINICAL DATA:  Fall at [facility]. Hit head. Hx of dementia. Unable to give history. Falls frequently per nursing home. EXAM: CT HEAD WITHOUT CONTRAST TECHNIQUE: Contiguous axial images were obtained from the base of the skull through the vertex without intravenous contrast. COMPARISON:  CT head 02/02/2021 BRAIN: BRAIN Cerebral ventricle sizes are concordant with the degree of cerebral volume loss. Cerebral ventricle sizes are concordant with the degree of cerebral volume loss. Patchy and confluent areas of decreased attenuation are noted throughout the deep and periventricular white matter of the cerebral hemispheres bilaterally, compatible with chronic microvascular ischemic disease. No evidence of large-territorial acute infarction. No parenchymal hemorrhage. No mass lesion. No extra-axial collection. No mass effect or midline shift. No hydrocephalus. Basilar cisterns are patent. Vascular: No hyperdense vessel. Atherosclerotic calcifications are present within the cavernous internal carotid arteries. Skull: No acute fracture or focal lesion. Sinuses/Orbits: Paranasal sinuses and mastoid air cells are clear. Left lens replacement. Otherwise the orbits are unremarkable. Other: None. IMPRESSION: Negative for acute traumatic injury. Electronically Signed   By:  Iven Finn M.D.   On: 05/21/2021 17:55   DG Chest Port 1 View  Result Date: 06/06/2021 CLINICAL DATA:  Cough, weakness EXAM: PORTABLE CHEST 1 VIEW COMPARISON:  05/13/2020 FINDINGS: Cardiac silhouette is mildly enlarged. Aorta is calcified and tortuous. Chronically coarsened bibasilar interstitial markings. No focal airspace consolidation. No pleural effusion or pneumothorax. Osseous structures are demineralized. IMPRESSION: Chronically coarsened bibasilar interstitial markings. No focal airspace consolidation. Electronically Signed   By: Davina Poke D.O.   On: 06/06/2021 12:03        Discharge Exam: Vitals:   06/07/21 1020 06/07/21 1432  BP: (!) 149/80 (!) 123/58  Pulse: 73 66  Resp: 20 18  Temp: 97.8 F (36.6 C) 97.8 F (36.6 C)  SpO2: 99% 95%   Vitals:   06/07/21 0627 06/07/21 1010 06/07/21 1020 06/07/21 1432  BP: (!) 160/82  (!) 149/80 (!) 123/58  Pulse: 74  73 66  Resp: 19  20 18   Temp: 97.6 F (36.4 C)  97.8 F (36.6 C) 97.8 F (36.6 C)  TempSrc: Oral   Oral  SpO2: 95% 98% 99% 95%  Weight:      Height:        General: Pt is alert, awake, not in acute distress Cardiovascular: RRR, S1/S2 +, no rubs, no gallops Respiratory: poor inspiratory effort, bibasilar rales. Abdominal: Soft, NT, ND, bowel sounds + Extremities: no edema, no cyanosis   The results of  significant diagnostics from this hospitalization (including imaging, microbiology, ancillary and laboratory) are listed below for reference.    Significant Diagnostic Studies: DG Pelvis 1-2 Views  Result Date: 05/21/2021 CLINICAL DATA:  Left leg pain after fall. EXAM: PELVIS - 1-2 VIEW COMPARISON:  May 13, 2020. FINDINGS: There is no evidence of pelvic fracture or diastasis. No pelvic bone lesions are seen. Moderate degenerative changes are seen involving both hip joints. IMPRESSION: No acute abnormality is noted. Electronically Signed   By: Marijo Conception M.D.   On: 05/21/2021 17:51   CT Head  Wo Contrast  Result Date: 06/06/2021 CLINICAL DATA:  Mental status change, unknown cause. EXAM: CT HEAD WITHOUT CONTRAST TECHNIQUE: Contiguous axial images were obtained from the base of the skull through the vertex without intravenous contrast. COMPARISON:  Head CT 05/21/2021 FINDINGS: Brain: Mild generalized cerebral and cerebellar atrophy. Moderate patchy and ill-defined hypoattenuation within the cerebral white matter, nonspecific but compatible with chronic small vessel ischemic disease. Redemonstrated small chronic infarct within the left cerebellar hemisphere. There is no acute intracranial hemorrhage. No demarcated cortical infarct. No extra-axial fluid collection. No evidence of an intracranial mass. No midline shift. Vascular: No hyperdense vessel.  Atherosclerotic calcifications. Skull: Normal. Negative for fracture or focal lesion. Sinuses/Orbits: Visualized orbits show no acute finding. Mucosal thickening and possible fluid within the inferior frontal sinuses bilaterally. Extensive partial opacification of the bilateral ethmoid air cells. Mild mucosal thickening within the right greater than left sphenoid sinuses. Moderate mucosal thickening and moderate volume frothy secretions within the left maxillary sinus. Small volume frothy secretions and mild mucosal thickening within the right maxillary sinus. Other: Anterior subluxation of the right mandibular condyle. IMPRESSION: No evidence of acute intracranial abnormality. Moderate chronic small vessel ischemic changes within the cerebral white matter. Redemonstrated small chronic infarct within the left cerebellar hemisphere. Mild generalized parenchymal atrophy. Anterior subluxation of the right mandibular condyle. This may be related to patient positioning at the time of image acquisition. However, correlate with physical exam findings to exclude TMJ dislocation. Paranasal sinus disease, as described. Correlate for acute on chronic sinusitis.  Electronically Signed   By: Kellie Simmering D.O.   On: 06/06/2021 14:02   CT HEAD WO CONTRAST (5MM)  Result Date: 05/21/2021 CLINICAL DATA:  Fall at [facility]. Hit head. Hx of dementia. Unable to give history. Falls frequently per nursing home. EXAM: CT HEAD WITHOUT CONTRAST TECHNIQUE: Contiguous axial images were obtained from the base of the skull through the vertex without intravenous contrast. COMPARISON:  CT head 02/02/2021 BRAIN: BRAIN Cerebral ventricle sizes are concordant with the degree of cerebral volume loss. Cerebral ventricle sizes are concordant with the degree of cerebral volume loss. Patchy and confluent areas of decreased attenuation are noted throughout the deep and periventricular white matter of the cerebral hemispheres bilaterally, compatible with chronic microvascular ischemic disease. No evidence of large-territorial acute infarction. No parenchymal hemorrhage. No mass lesion. No extra-axial collection. No mass effect or midline shift. No hydrocephalus. Basilar cisterns are patent. Vascular: No hyperdense vessel. Atherosclerotic calcifications are present within the cavernous internal carotid arteries. Skull: No acute fracture or focal lesion. Sinuses/Orbits: Paranasal sinuses and mastoid air cells are clear. Left lens replacement. Otherwise the orbits are unremarkable. Other: None. IMPRESSION: Negative for acute traumatic injury. Electronically Signed   By: Iven Finn M.D.   On: 05/21/2021 17:55   DG Chest Port 1 View  Result Date: 06/06/2021 CLINICAL DATA:  Cough, weakness EXAM: PORTABLE CHEST 1 VIEW COMPARISON:  05/13/2020 FINDINGS: Cardiac  silhouette is mildly enlarged. Aorta is calcified and tortuous. Chronically coarsened bibasilar interstitial markings. No focal airspace consolidation. No pleural effusion or pneumothorax. Osseous structures are demineralized. IMPRESSION: Chronically coarsened bibasilar interstitial markings. No focal airspace consolidation. Electronically  Signed   By: Davina Poke D.O.   On: 06/06/2021 12:03    Microbiology: Recent Results (from the past 240 hour(s))  Resp Panel by RT-PCR (Flu A&B, Covid) Nasopharyngeal Swab     Status: None   Collection Time: 06/06/21 11:15 AM   Specimen: Nasopharyngeal Swab; Nasopharyngeal(NP) swabs in vial transport medium  Result Value Ref Range Status   SARS Coronavirus 2 by RT PCR NEGATIVE NEGATIVE Final    Comment: (NOTE) SARS-CoV-2 target nucleic acids are NOT DETECTED.  The SARS-CoV-2 RNA is generally detectable in upper respiratory specimens during the acute phase of infection. The lowest concentration of SARS-CoV-2 viral copies this assay can detect is 138 copies/mL. A negative result does not preclude SARS-Cov-2 infection and should not be used as the sole basis for treatment or other patient management decisions. A negative result may occur with  improper specimen collection/handling, submission of specimen other than nasopharyngeal swab, presence of viral mutation(s) within the areas targeted by this assay, and inadequate number of viral copies(<138 copies/mL). A negative result must be combined with clinical observations, patient history, and epidemiological information. The expected result is Negative.  Fact Sheet for Patients:  EntrepreneurPulse.com.au  Fact Sheet for Healthcare Providers:  IncredibleEmployment.be  This test is no t yet approved or cleared by the [place] FDA and  has been authorized for detection and/or diagnosis of SARS-CoV-2 by FDA under an Emergency Use Authorization (EUA). This EUA will remain  in effect (meaning this test can be used) for the duration of the COVID-19 declaration under Section 564(b)(1) of the Act, 21 U.S.C.section 360bbb-3(b)(1), unless the authorization is terminated  or revoked sooner.       Influenza A by PCR NEGATIVE NEGATIVE Final   Influenza B by PCR NEGATIVE NEGATIVE Final    Comment:  (NOTE) The Xpert Xpress SARS-CoV-2/FLU/RSV plus assay is intended as an aid in the diagnosis of influenza from Nasopharyngeal swab specimens and should not be used as a sole basis for treatment. Nasal washings and aspirates are unacceptable for Xpert Xpress SARS-CoV-2/FLU/RSV testing.  Fact Sheet for Patients: EntrepreneurPulse.com.au  Fact Sheet for Healthcare Providers: IncredibleEmployment.be  This test is not yet approved or cleared by the [place] FDA and has been authorized for detection and/or diagnosis of SARS-CoV-2 by FDA under an Emergency Use Authorization (EUA). This EUA will remain in effect (meaning this test can be used) for the duration of the COVID-19 declaration under Section 564(b)(1) of the Act, 21 U.S.C. section 360bbb-3(b)(1), unless the authorization is terminated or revoked.  Performed at [facility], [address] [place], [place] [address]      Labs: Basic Metabolic Panel: Recent Labs  Lab 06/06/21 1138 06/06/21 1549 06/07/21 0602  NA 139  --  138  K 4.1  --  4.4  CL 102  --  105  CO2 29  --  27  GLUCOSE 96  --  86  BUN 40*  --  31*  CREATININE 1.37*  --  1.10*  CALCIUM 9.5  --  8.6*  MG  --  1.8 1.7   Liver Function Tests: Recent Labs  Lab 06/06/21 1138  AST 35  ALT 18  ALKPHOS 64  BILITOT 0.6  PROT 7.3  ALBUMIN 3.7   No results  for input(s): LIPASE, AMYLASE in the last 168 hours. Recent Labs  Lab 06/06/21 1140  AMMONIA <10   CBC: Recent Labs  Lab 06/06/21 1138 06/07/21 0602  WBC 6.9 8.1  NEUTROABS 5.2  --   HGB 13.7 12.7  HCT 43.5 39.0  MCV 95.4 95.1  PLT 183 187   Cardiac Enzymes: Recent Labs  Lab 06/06/21 1549  CKTOTAL 114   BNP: Invalid input(s): POCBNP CBG: Recent Labs  Lab 06/06/21 1127  GLUCAP 87    Time coordinating discharge:  36 minutes  Signed:  Orson Eva, DO [facility] Pager: [phone] 06/07/2021, 2:45 PM

## 2021-06-08 LAB — URINE CULTURE: Culture: 1000 — AB

## 2021-06-09 ENCOUNTER — Ambulatory Visit: Payer: Medicare Other | Admitting: Family Medicine

## 2021-06-09 ENCOUNTER — Encounter: Payer: Self-pay | Admitting: Family Medicine

## 2021-06-09 DIAGNOSIS — R0989 Other specified symptoms and signs involving the circulatory and respiratory systems: Secondary | ICD-10-CM | POA: Diagnosis not present

## 2021-06-09 DIAGNOSIS — L89312 Pressure ulcer of right buttock, stage 2: Secondary | ICD-10-CM | POA: Diagnosis not present

## 2021-06-09 DIAGNOSIS — G9341 Metabolic encephalopathy: Secondary | ICD-10-CM | POA: Diagnosis not present

## 2021-06-09 DIAGNOSIS — S31829A Unspecified open wound of left buttock, initial encounter: Secondary | ICD-10-CM

## 2021-06-09 DIAGNOSIS — M6281 Muscle weakness (generalized): Secondary | ICD-10-CM | POA: Diagnosis not present

## 2021-06-09 DIAGNOSIS — N1832 Chronic kidney disease, stage 3b: Secondary | ICD-10-CM | POA: Diagnosis not present

## 2021-06-09 DIAGNOSIS — N390 Urinary tract infection, site not specified: Secondary | ICD-10-CM | POA: Diagnosis not present

## 2021-06-09 DIAGNOSIS — I129 Hypertensive chronic kidney disease with stage 1 through stage 4 chronic kidney disease, or unspecified chronic kidney disease: Secondary | ICD-10-CM | POA: Diagnosis not present

## 2021-06-09 MED ORDER — METHYLPREDNISOLONE 4 MG PO TBPK: ORAL_TABLET | ORAL | 0 refills | Status: DC

## 2021-06-09 NOTE — Progress Notes (Signed)
Virtual Visit via Telephone Note  I connected with Kelly Freeman on 06/09/21 at 10:55 AM by telephone and verified that I am speaking with the correct person using two identifiers. Kelly Freeman is currently located at her home at [facility] and her granddaughter is currently with her during this visit. The provider, Loman Brooklyn, FNP is located in their office at time of visit.  I discussed the limitations, risks, security and privacy concerns of performing an evaluation and management service by telephone and the availability of in person appointments. I also discussed with the patient that there may be a patient responsible charge related to this service. The patient expressed understanding and agreed to proceed.  Subjective: PCP: [name], FNP  Chief Complaint  Patient presents with   Cough   Patient complains of cough, chest congestion, runny nose, sneezing, and postnasal drainage. Patient was initially treated on 06/01/2021 with azithromycin after five days of symptoms. It is unknown if she improved and got worse or if she is still improving as the granddaughter that goes to see her daily had gone on vacation. She is drinking moderate amounts of fluids. She does not smoke.   The granddaughter is also concerned that the patient has a sore on her left buttock the size of a quarter. She has been applying Balmex and is questioning if she should be doing something different. She is moving her to different chairs throughout the day to get her to change positions.    ROS: Per HPI  Current Outpatient Medications:    acetaminophen (TYLENOL) 325 MG tablet, Take 2 tablets (650 mg total) by mouth every 8 (eight) hours., Disp: 540 tablet, Rfl: 1   amLODipine (NORVASC) 5 MG tablet, Take 1 tablet (5 mg total) by mouth daily., Disp: 30 tablet, Rfl: 1   cephALEXin (KEFLEX) 250 MG capsule, Take 250 mg by mouth daily., Disp: , Rfl:    cetirizine (ZYRTEC ALLERGY) 10 MG tablet, Take 0.5  tablets (5 mg total) by mouth daily., Disp: 30 tablet, Rfl: 5   ferrous sulfate 325 (65 FE) MG tablet, Take 325 mg by mouth every morning., Disp: , Rfl:    furosemide (LASIX) 20 MG tablet, Take 1 tablet (20 mg total) by mouth daily., Disp: 30 tablet, Rfl: 0   haloperidol (HALDOL) 2 MG/ML solution, Take 1 mg by mouth 2 (two) times daily as needed for agitation., Disp: , Rfl:    magnesium oxide (MAG-OX) 400 (240 Mg) MG tablet, Take 1 tablet (400 mg total) by mouth daily., Disp: 30 tablet, Rfl: 1   melatonin 5 MG TABS, Take 5 mg by mouth at bedtime., Disp: , Rfl:    sertraline (ZOLOFT) 20 MG/ML concentrated solution, Take 60 mg by mouth every morning., Disp: , Rfl:    valproic acid (DEPAKENE) 250 MG/5ML solution, Take 125 mg by mouth 3 (three) times daily., Disp: , Rfl:    zinc oxide (BALMEX) 11.3 % CREA cream, Apply 1 application topically 2 (two) times daily., Disp: 453 g, Rfl: 1  Allergies  Allergen Reactions   Levofloxacin Rash   Gabapentin Other (See Comments)    Other reaction(s): Confusion   Past Medical History:  Diagnosis Date   Acute cystitis without hematuria    Acute diarrhea    Allergy    Anxiety    Blood transfusion without reported diagnosis 1949   Cancer ([place])    skin cancer on face   Cataract    bilateral   Chronic allergic rhinitis  CKD (chronic kidney disease) stage 3, GFR 30-59 ml/min (HCC)    Cognitive impairment    DDD (degenerative disc disease), lumbar    Dementia (HCC)    Dizziness    E. coli UTI    Fever    Gastroenteritis    GERD (gastroesophageal reflux disease)    Hereditary and idiopathic peripheral neuropathy    History of recurrent UTIs    Hypertension    Hypertension    MGUS (monoclonal gammopathy of unknown significance)    Migraine without aura and without status migrainosus, not intractable    Mixed incontinence urge and stress    Moderate episode of recurrent major depressive disorder (HCC)    Nausea and vomiting    Obesity     Obstructive sleep apnea    Osteopenia    Recurrent falls    Restless leg syndrome    Sleep apnea    UTI (urinary tract infection)     Observations/Objective: A&O  No respiratory distress or wheezing audible over the phone Mood, judgement, and thought processes all WNL  Assessment and Plan: 1. Chest congestion - methylPREDNISolone (MEDROL DOSEPAK) 4 MG TBPK tablet; Use as directed.  Dispense: 21 each; Refill: 0  2. Open wound of left buttock, initial encounter Asked granddaughter to send a picture via MyChart so that I can see what we are treating.   Follow Up Instructions:  I discussed the assessment and treatment plan with the patient. The patient was provided an opportunity to ask questions and all were answered. The patient agreed with the plan and demonstrated an understanding of the instructions.   The patient was advised to call back or seek an in-person evaluation if the symptoms worsen or if the condition fails to improve as anticipated.  The above assessment and management plan was discussed with the patient. The patient verbalized understanding of and has agreed to the management plan. Patient is aware to call the clinic if symptoms persist or worsen. Patient is aware when to return to the clinic for a follow-up visit. Patient educated on when it is appropriate to go to the emergency department.   Time call ended: 11:06 AM  I provided 11 minutes of non-face-to-face time during this encounter.  Hendricks Limes, MSN, APRN, FNP-C [facility] 06/09/21

## 2021-06-10 ENCOUNTER — Telehealth: Payer: Self-pay | Admitting: *Deleted

## 2021-06-10 DIAGNOSIS — M6281 Muscle weakness (generalized): Secondary | ICD-10-CM | POA: Diagnosis not present

## 2021-06-10 DIAGNOSIS — L89312 Pressure ulcer of right buttock, stage 2: Secondary | ICD-10-CM | POA: Diagnosis not present

## 2021-06-10 DIAGNOSIS — N3000 Acute cystitis without hematuria: Secondary | ICD-10-CM

## 2021-06-10 DIAGNOSIS — I129 Hypertensive chronic kidney disease with stage 1 through stage 4 chronic kidney disease, or unspecified chronic kidney disease: Secondary | ICD-10-CM | POA: Diagnosis not present

## 2021-06-10 DIAGNOSIS — N1832 Chronic kidney disease, stage 3b: Secondary | ICD-10-CM | POA: Diagnosis not present

## 2021-06-10 DIAGNOSIS — N390 Urinary tract infection, site not specified: Secondary | ICD-10-CM | POA: Diagnosis not present

## 2021-06-10 DIAGNOSIS — G9341 Metabolic encephalopathy: Secondary | ICD-10-CM | POA: Diagnosis not present

## 2021-06-10 MED ORDER — CEFTRIAXONE SODIUM 1 G IJ SOLR
1.0000 g | INTRAMUSCULAR | 0 refills | Status: AC
Start: 2021-06-10 — End: 2021-06-13

## 2021-06-10 NOTE — Addendum Note (Signed)
Addended by: [name] on: 06/10/2021 11:57 AM   Modules accepted: Orders

## 2021-06-10 NOTE — Telephone Encounter (Signed)
Rx sent 

## 2021-06-10 NOTE — Telephone Encounter (Signed)
Call w/ Abby from [facility] to see pt today at [facility] Did not have Rocephin at [facility] to administer to her today Need Rx sent to [facility] they will have it delivered today for [facility] to administer on Sat, Sun & Monday. Gave VO for wound care to her as well. Please call Abby back when Rx is sent to pharmacy

## 2021-06-11 DIAGNOSIS — N390 Urinary tract infection, site not specified: Secondary | ICD-10-CM | POA: Diagnosis not present

## 2021-06-11 DIAGNOSIS — L89312 Pressure ulcer of right buttock, stage 2: Secondary | ICD-10-CM | POA: Diagnosis not present

## 2021-06-11 DIAGNOSIS — M6281 Muscle weakness (generalized): Secondary | ICD-10-CM | POA: Diagnosis not present

## 2021-06-11 DIAGNOSIS — I129 Hypertensive chronic kidney disease with stage 1 through stage 4 chronic kidney disease, or unspecified chronic kidney disease: Secondary | ICD-10-CM | POA: Diagnosis not present

## 2021-06-11 DIAGNOSIS — G9341 Metabolic encephalopathy: Secondary | ICD-10-CM | POA: Diagnosis not present

## 2021-06-11 DIAGNOSIS — N1832 Chronic kidney disease, stage 3b: Secondary | ICD-10-CM | POA: Diagnosis not present

## 2021-06-12 DIAGNOSIS — I129 Hypertensive chronic kidney disease with stage 1 through stage 4 chronic kidney disease, or unspecified chronic kidney disease: Secondary | ICD-10-CM | POA: Diagnosis not present

## 2021-06-12 DIAGNOSIS — M6281 Muscle weakness (generalized): Secondary | ICD-10-CM | POA: Diagnosis not present

## 2021-06-12 DIAGNOSIS — G9341 Metabolic encephalopathy: Secondary | ICD-10-CM | POA: Diagnosis not present

## 2021-06-12 DIAGNOSIS — N390 Urinary tract infection, site not specified: Secondary | ICD-10-CM | POA: Diagnosis not present

## 2021-06-12 DIAGNOSIS — L89312 Pressure ulcer of right buttock, stage 2: Secondary | ICD-10-CM | POA: Diagnosis not present

## 2021-06-12 DIAGNOSIS — N1832 Chronic kidney disease, stage 3b: Secondary | ICD-10-CM | POA: Diagnosis not present

## 2021-06-13 DIAGNOSIS — N390 Urinary tract infection, site not specified: Secondary | ICD-10-CM | POA: Diagnosis not present

## 2021-06-13 DIAGNOSIS — L89312 Pressure ulcer of right buttock, stage 2: Secondary | ICD-10-CM | POA: Diagnosis not present

## 2021-06-13 DIAGNOSIS — M6281 Muscle weakness (generalized): Secondary | ICD-10-CM | POA: Diagnosis not present

## 2021-06-13 DIAGNOSIS — G9341 Metabolic encephalopathy: Secondary | ICD-10-CM | POA: Diagnosis not present

## 2021-06-13 DIAGNOSIS — N1832 Chronic kidney disease, stage 3b: Secondary | ICD-10-CM | POA: Diagnosis not present

## 2021-06-13 DIAGNOSIS — I129 Hypertensive chronic kidney disease with stage 1 through stage 4 chronic kidney disease, or unspecified chronic kidney disease: Secondary | ICD-10-CM | POA: Diagnosis not present

## 2021-06-14 DIAGNOSIS — L89312 Pressure ulcer of right buttock, stage 2: Secondary | ICD-10-CM | POA: Diagnosis not present

## 2021-06-14 DIAGNOSIS — S80811A Abrasion, right lower leg, initial encounter: Secondary | ICD-10-CM | POA: Diagnosis not present

## 2021-06-14 DIAGNOSIS — S51812A Laceration without foreign body of left forearm, initial encounter: Secondary | ICD-10-CM | POA: Diagnosis not present

## 2021-06-14 DIAGNOSIS — S51802A Unspecified open wound of left forearm, initial encounter: Secondary | ICD-10-CM | POA: Diagnosis not present

## 2021-06-15 DIAGNOSIS — I129 Hypertensive chronic kidney disease with stage 1 through stage 4 chronic kidney disease, or unspecified chronic kidney disease: Secondary | ICD-10-CM | POA: Diagnosis not present

## 2021-06-15 DIAGNOSIS — N1832 Chronic kidney disease, stage 3b: Secondary | ICD-10-CM | POA: Diagnosis not present

## 2021-06-15 DIAGNOSIS — L89312 Pressure ulcer of right buttock, stage 2: Secondary | ICD-10-CM | POA: Diagnosis not present

## 2021-06-15 DIAGNOSIS — G9341 Metabolic encephalopathy: Secondary | ICD-10-CM | POA: Diagnosis not present

## 2021-06-15 DIAGNOSIS — N390 Urinary tract infection, site not specified: Secondary | ICD-10-CM | POA: Diagnosis not present

## 2021-06-15 DIAGNOSIS — M6281 Muscle weakness (generalized): Secondary | ICD-10-CM | POA: Diagnosis not present

## 2021-06-16 DIAGNOSIS — N1832 Chronic kidney disease, stage 3b: Secondary | ICD-10-CM | POA: Diagnosis not present

## 2021-06-16 DIAGNOSIS — L89312 Pressure ulcer of right buttock, stage 2: Secondary | ICD-10-CM | POA: Diagnosis not present

## 2021-06-16 DIAGNOSIS — M6281 Muscle weakness (generalized): Secondary | ICD-10-CM | POA: Diagnosis not present

## 2021-06-16 DIAGNOSIS — N390 Urinary tract infection, site not specified: Secondary | ICD-10-CM | POA: Diagnosis not present

## 2021-06-16 DIAGNOSIS — G9341 Metabolic encephalopathy: Secondary | ICD-10-CM | POA: Diagnosis not present

## 2021-06-16 DIAGNOSIS — I129 Hypertensive chronic kidney disease with stage 1 through stage 4 chronic kidney disease, or unspecified chronic kidney disease: Secondary | ICD-10-CM | POA: Diagnosis not present

## 2021-06-17 DIAGNOSIS — I129 Hypertensive chronic kidney disease with stage 1 through stage 4 chronic kidney disease, or unspecified chronic kidney disease: Secondary | ICD-10-CM | POA: Diagnosis not present

## 2021-06-17 DIAGNOSIS — G9341 Metabolic encephalopathy: Secondary | ICD-10-CM | POA: Diagnosis not present

## 2021-06-17 DIAGNOSIS — N390 Urinary tract infection, site not specified: Secondary | ICD-10-CM | POA: Diagnosis not present

## 2021-06-17 DIAGNOSIS — N1832 Chronic kidney disease, stage 3b: Secondary | ICD-10-CM | POA: Diagnosis not present

## 2021-06-17 DIAGNOSIS — L89312 Pressure ulcer of right buttock, stage 2: Secondary | ICD-10-CM | POA: Diagnosis not present

## 2021-06-17 DIAGNOSIS — M6281 Muscle weakness (generalized): Secondary | ICD-10-CM | POA: Diagnosis not present

## 2021-06-21 ENCOUNTER — Other Ambulatory Visit: Payer: Self-pay

## 2021-06-21 ENCOUNTER — Ambulatory Visit (INDEPENDENT_AMBULATORY_CARE_PROVIDER_SITE_OTHER): Payer: Medicare Other

## 2021-06-21 DIAGNOSIS — N390 Urinary tract infection, site not specified: Secondary | ICD-10-CM

## 2021-06-21 DIAGNOSIS — I129 Hypertensive chronic kidney disease with stage 1 through stage 4 chronic kidney disease, or unspecified chronic kidney disease: Secondary | ICD-10-CM

## 2021-06-21 DIAGNOSIS — G9341 Metabolic encephalopathy: Secondary | ICD-10-CM

## 2021-06-21 DIAGNOSIS — F418 Other specified anxiety disorders: Secondary | ICD-10-CM

## 2021-06-21 DIAGNOSIS — N1832 Chronic kidney disease, stage 3b: Secondary | ICD-10-CM | POA: Diagnosis not present

## 2021-06-21 DIAGNOSIS — L89312 Pressure ulcer of right buttock, stage 2: Secondary | ICD-10-CM

## 2021-06-21 DIAGNOSIS — M6281 Muscle weakness (generalized): Secondary | ICD-10-CM | POA: Diagnosis not present

## 2021-06-21 DIAGNOSIS — F0390 Unspecified dementia without behavioral disturbance: Secondary | ICD-10-CM

## 2021-06-22 DIAGNOSIS — L89312 Pressure ulcer of right buttock, stage 2: Secondary | ICD-10-CM | POA: Diagnosis not present

## 2021-06-22 DIAGNOSIS — I129 Hypertensive chronic kidney disease with stage 1 through stage 4 chronic kidney disease, or unspecified chronic kidney disease: Secondary | ICD-10-CM | POA: Diagnosis not present

## 2021-06-22 DIAGNOSIS — M6281 Muscle weakness (generalized): Secondary | ICD-10-CM | POA: Diagnosis not present

## 2021-06-22 DIAGNOSIS — G9341 Metabolic encephalopathy: Secondary | ICD-10-CM | POA: Diagnosis not present

## 2021-06-22 DIAGNOSIS — N1832 Chronic kidney disease, stage 3b: Secondary | ICD-10-CM | POA: Diagnosis not present

## 2021-06-22 DIAGNOSIS — N390 Urinary tract infection, site not specified: Secondary | ICD-10-CM | POA: Diagnosis not present

## 2021-06-23 ENCOUNTER — Telehealth: Payer: Self-pay | Admitting: *Deleted

## 2021-06-23 DIAGNOSIS — N390 Urinary tract infection, site not specified: Secondary | ICD-10-CM | POA: Diagnosis not present

## 2021-06-23 DIAGNOSIS — I129 Hypertensive chronic kidney disease with stage 1 through stage 4 chronic kidney disease, or unspecified chronic kidney disease: Secondary | ICD-10-CM | POA: Diagnosis not present

## 2021-06-23 DIAGNOSIS — L89312 Pressure ulcer of right buttock, stage 2: Secondary | ICD-10-CM | POA: Diagnosis not present

## 2021-06-23 DIAGNOSIS — M6281 Muscle weakness (generalized): Secondary | ICD-10-CM | POA: Diagnosis not present

## 2021-06-23 DIAGNOSIS — N1832 Chronic kidney disease, stage 3b: Secondary | ICD-10-CM | POA: Diagnosis not present

## 2021-06-23 DIAGNOSIS — G9341 Metabolic encephalopathy: Secondary | ICD-10-CM | POA: Diagnosis not present

## 2021-06-23 NOTE — Telephone Encounter (Signed)
FYI: VM from Tammy w/ [facility] Pt resident at [facility], found on floor this morning, no injury or reporting pain, vitals all WNL

## 2021-06-24 DIAGNOSIS — N1832 Chronic kidney disease, stage 3b: Secondary | ICD-10-CM | POA: Diagnosis not present

## 2021-06-24 DIAGNOSIS — L89312 Pressure ulcer of right buttock, stage 2: Secondary | ICD-10-CM | POA: Diagnosis not present

## 2021-06-24 DIAGNOSIS — M6281 Muscle weakness (generalized): Secondary | ICD-10-CM | POA: Diagnosis not present

## 2021-06-24 DIAGNOSIS — N390 Urinary tract infection, site not specified: Secondary | ICD-10-CM | POA: Diagnosis not present

## 2021-06-24 DIAGNOSIS — I129 Hypertensive chronic kidney disease with stage 1 through stage 4 chronic kidney disease, or unspecified chronic kidney disease: Secondary | ICD-10-CM | POA: Diagnosis not present

## 2021-06-24 DIAGNOSIS — G9341 Metabolic encephalopathy: Secondary | ICD-10-CM | POA: Diagnosis not present

## 2021-06-28 DIAGNOSIS — N1832 Chronic kidney disease, stage 3b: Secondary | ICD-10-CM | POA: Diagnosis not present

## 2021-06-28 DIAGNOSIS — G9341 Metabolic encephalopathy: Secondary | ICD-10-CM | POA: Diagnosis not present

## 2021-06-28 DIAGNOSIS — L89312 Pressure ulcer of right buttock, stage 2: Secondary | ICD-10-CM | POA: Diagnosis not present

## 2021-06-28 DIAGNOSIS — N390 Urinary tract infection, site not specified: Secondary | ICD-10-CM | POA: Diagnosis not present

## 2021-06-28 DIAGNOSIS — M6281 Muscle weakness (generalized): Secondary | ICD-10-CM | POA: Diagnosis not present

## 2021-06-28 DIAGNOSIS — I129 Hypertensive chronic kidney disease with stage 1 through stage 4 chronic kidney disease, or unspecified chronic kidney disease: Secondary | ICD-10-CM | POA: Diagnosis not present

## 2021-06-29 DIAGNOSIS — N1832 Chronic kidney disease, stage 3b: Secondary | ICD-10-CM | POA: Diagnosis not present

## 2021-06-29 DIAGNOSIS — N390 Urinary tract infection, site not specified: Secondary | ICD-10-CM | POA: Diagnosis not present

## 2021-06-29 DIAGNOSIS — M6281 Muscle weakness (generalized): Secondary | ICD-10-CM | POA: Diagnosis not present

## 2021-06-29 DIAGNOSIS — I129 Hypertensive chronic kidney disease with stage 1 through stage 4 chronic kidney disease, or unspecified chronic kidney disease: Secondary | ICD-10-CM | POA: Diagnosis not present

## 2021-06-29 DIAGNOSIS — G9341 Metabolic encephalopathy: Secondary | ICD-10-CM | POA: Diagnosis not present

## 2021-06-29 DIAGNOSIS — L89312 Pressure ulcer of right buttock, stage 2: Secondary | ICD-10-CM | POA: Diagnosis not present

## 2021-06-30 DIAGNOSIS — L89312 Pressure ulcer of right buttock, stage 2: Secondary | ICD-10-CM | POA: Diagnosis not present

## 2021-06-30 DIAGNOSIS — M6281 Muscle weakness (generalized): Secondary | ICD-10-CM | POA: Diagnosis not present

## 2021-06-30 DIAGNOSIS — N1832 Chronic kidney disease, stage 3b: Secondary | ICD-10-CM | POA: Diagnosis not present

## 2021-06-30 DIAGNOSIS — G9341 Metabolic encephalopathy: Secondary | ICD-10-CM | POA: Diagnosis not present

## 2021-06-30 DIAGNOSIS — I129 Hypertensive chronic kidney disease with stage 1 through stage 4 chronic kidney disease, or unspecified chronic kidney disease: Secondary | ICD-10-CM | POA: Diagnosis not present

## 2021-06-30 DIAGNOSIS — N390 Urinary tract infection, site not specified: Secondary | ICD-10-CM | POA: Diagnosis not present

## 2021-07-04 DIAGNOSIS — I129 Hypertensive chronic kidney disease with stage 1 through stage 4 chronic kidney disease, or unspecified chronic kidney disease: Secondary | ICD-10-CM | POA: Diagnosis not present

## 2021-07-04 DIAGNOSIS — L89312 Pressure ulcer of right buttock, stage 2: Secondary | ICD-10-CM | POA: Diagnosis not present

## 2021-07-04 DIAGNOSIS — G9341 Metabolic encephalopathy: Secondary | ICD-10-CM | POA: Diagnosis not present

## 2021-07-04 DIAGNOSIS — M6281 Muscle weakness (generalized): Secondary | ICD-10-CM | POA: Diagnosis not present

## 2021-07-04 DIAGNOSIS — N1832 Chronic kidney disease, stage 3b: Secondary | ICD-10-CM | POA: Diagnosis not present

## 2021-07-04 DIAGNOSIS — N390 Urinary tract infection, site not specified: Secondary | ICD-10-CM | POA: Diagnosis not present

## 2021-07-05 DIAGNOSIS — L89312 Pressure ulcer of right buttock, stage 2: Secondary | ICD-10-CM | POA: Diagnosis not present

## 2021-07-05 DIAGNOSIS — M6281 Muscle weakness (generalized): Secondary | ICD-10-CM | POA: Diagnosis not present

## 2021-07-05 DIAGNOSIS — I129 Hypertensive chronic kidney disease with stage 1 through stage 4 chronic kidney disease, or unspecified chronic kidney disease: Secondary | ICD-10-CM | POA: Diagnosis not present

## 2021-07-05 DIAGNOSIS — N390 Urinary tract infection, site not specified: Secondary | ICD-10-CM | POA: Diagnosis not present

## 2021-07-05 DIAGNOSIS — G9341 Metabolic encephalopathy: Secondary | ICD-10-CM | POA: Diagnosis not present

## 2021-07-05 DIAGNOSIS — N1832 Chronic kidney disease, stage 3b: Secondary | ICD-10-CM | POA: Diagnosis not present

## 2021-07-06 ENCOUNTER — Ambulatory Visit (INDEPENDENT_AMBULATORY_CARE_PROVIDER_SITE_OTHER): Payer: Medicare Other

## 2021-07-06 VITALS — Ht 67.0 in | Wt 132.0 lb

## 2021-07-06 DIAGNOSIS — R32 Unspecified urinary incontinence: Secondary | ICD-10-CM | POA: Insufficient documentation

## 2021-07-06 DIAGNOSIS — L89312 Pressure ulcer of right buttock, stage 2: Secondary | ICD-10-CM | POA: Diagnosis not present

## 2021-07-06 DIAGNOSIS — M2041 Other hammer toe(s) (acquired), right foot: Secondary | ICD-10-CM | POA: Insufficient documentation

## 2021-07-06 DIAGNOSIS — D518 Other vitamin B12 deficiency anemias: Secondary | ICD-10-CM | POA: Insufficient documentation

## 2021-07-06 DIAGNOSIS — F329 Major depressive disorder, single episode, unspecified: Secondary | ICD-10-CM | POA: Insufficient documentation

## 2021-07-06 DIAGNOSIS — G4700 Insomnia, unspecified: Secondary | ICD-10-CM | POA: Insufficient documentation

## 2021-07-06 DIAGNOSIS — L98499 Non-pressure chronic ulcer of skin of other sites with unspecified severity: Secondary | ICD-10-CM | POA: Insufficient documentation

## 2021-07-06 DIAGNOSIS — I872 Venous insufficiency (chronic) (peripheral): Secondary | ICD-10-CM | POA: Insufficient documentation

## 2021-07-06 DIAGNOSIS — D509 Iron deficiency anemia, unspecified: Secondary | ICD-10-CM | POA: Insufficient documentation

## 2021-07-06 DIAGNOSIS — E039 Hypothyroidism, unspecified: Secondary | ICD-10-CM | POA: Insufficient documentation

## 2021-07-06 DIAGNOSIS — E785 Hyperlipidemia, unspecified: Secondary | ICD-10-CM | POA: Insufficient documentation

## 2021-07-06 DIAGNOSIS — Z0000 Encounter for general adult medical examination without abnormal findings: Secondary | ICD-10-CM

## 2021-07-06 DIAGNOSIS — N1832 Chronic kidney disease, stage 3b: Secondary | ICD-10-CM | POA: Diagnosis not present

## 2021-07-06 DIAGNOSIS — N390 Urinary tract infection, site not specified: Secondary | ICD-10-CM | POA: Diagnosis not present

## 2021-07-06 DIAGNOSIS — G9341 Metabolic encephalopathy: Secondary | ICD-10-CM | POA: Diagnosis not present

## 2021-07-06 DIAGNOSIS — M6281 Muscle weakness (generalized): Secondary | ICD-10-CM | POA: Diagnosis not present

## 2021-07-06 DIAGNOSIS — I890 Lymphedema, not elsewhere classified: Secondary | ICD-10-CM | POA: Insufficient documentation

## 2021-07-06 DIAGNOSIS — F419 Anxiety disorder, unspecified: Secondary | ICD-10-CM | POA: Insufficient documentation

## 2021-07-06 DIAGNOSIS — I129 Hypertensive chronic kidney disease with stage 1 through stage 4 chronic kidney disease, or unspecified chronic kidney disease: Secondary | ICD-10-CM | POA: Diagnosis not present

## 2021-07-06 DIAGNOSIS — L97909 Non-pressure chronic ulcer of unspecified part of unspecified lower leg with unspecified severity: Secondary | ICD-10-CM | POA: Insufficient documentation

## 2021-07-06 DIAGNOSIS — E559 Vitamin D deficiency, unspecified: Secondary | ICD-10-CM | POA: Insufficient documentation

## 2021-07-06 DIAGNOSIS — R269 Unspecified abnormalities of gait and mobility: Secondary | ICD-10-CM | POA: Insufficient documentation

## 2021-07-06 NOTE — Patient Instructions (Signed)
[name] , Thank you for taking time to come for your Medicare Wellness Visit. I appreciate your ongoing commitment to your health goals. Please review the following plan we discussed and let me know if I can assist you in the future.   Screening recommendations/referrals: Colonoscopy: No longer required Mammogram: No longer required Bone Density: No longer required Recommended yearly ophthalmology/optometry visit for glaucoma screening and checkup Recommended yearly dental visit for hygiene and checkup  Vaccinations: Influenza vaccine: Done 2022 - Repeat annually *we need dates please Pneumococcal vaccine: Done 03/10/2013 & 02/03/2015 Tdap vaccine: Done 03/14/2016 - Repeat in 10 years Shingles vaccine: Done 05/31/2017 - due for second dose   Covid-19:Done *we need dates please  Advanced directives: Please bring a copy of your health care power of attorney and living will to the office to be added to your chart at your convenience.   Conditions/risks identified: Aim for 30 minutes of exercise or brisk walking each day, drink 6-8 glasses of water and eat lots of fruits and vegetables.   Next appointment: Follow up in one year for your annual wellness visit    Preventive Care 65 Years and Older, Female Preventive care refers to lifestyle choices and visits with your health care provider that can promote health and wellness. What does preventive care include? A yearly physical exam. This is also called an annual well check. Dental exams once or twice a year. Routine eye exams. Ask your health care provider how often you should have your eyes checked. Personal lifestyle choices, including: Daily care of your teeth and gums. Regular physical activity. Eating a healthy diet. Avoiding tobacco and drug use. Limiting alcohol use. Practicing safe sex. Taking low-dose aspirin every day. Taking vitamin and mineral supplements as recommended by your health care provider. What happens during  an annual well check? The services and screenings done by your health care provider during your annual well check will depend on your age, overall health, lifestyle risk factors, and family history of disease. Counseling  Your health care provider may ask you questions about your: Alcohol use. Tobacco use. Drug use. Emotional well-being. Home and relationship well-being. Sexual activity. Eating habits. History of falls. Memory and ability to understand (cognition). Work and work Statistician. Reproductive health. Screening  You may have the following tests or measurements: Height, weight, and BMI. Blood pressure. Lipid and cholesterol levels. These may be checked every 5 years, or more frequently if you are over 87 years old. Skin check. Lung cancer screening. You may have this screening every year starting at age 91 if you have a 30-pack-year history of smoking and currently smoke or have quit within the past 15 years. Fecal occult blood test (FOBT) of the stool. You may have this test every year starting at age 28. Flexible sigmoidoscopy or colonoscopy. You may have a sigmoidoscopy every 5 years or a colonoscopy every 10 years starting at age 81. Hepatitis C blood test. Hepatitis B blood test. Sexually transmitted disease (STD) testing. Diabetes screening. This is done by checking your blood sugar (glucose) after you have not eaten for a while (fasting). You may have this done every 1-3 years. Bone density scan. This is done to screen for osteoporosis. You may have this done starting at age 49. Mammogram. This may be done every 1-2 years. Talk to your health care provider about how often you should have regular mammograms. Talk with your health care provider about your test results, treatment options, and if necessary, the need for  more tests. Vaccines  Your health care provider may recommend certain vaccines, such as: Influenza vaccine. This is recommended every year. Tetanus,  diphtheria, and acellular pertussis (Tdap, Td) vaccine. You may need a Td booster every 10 years. Zoster vaccine. You may need this after age 69. Pneumococcal 13-valent conjugate (PCV13) vaccine. One dose is recommended after age 75. Pneumococcal polysaccharide (PPSV23) vaccine. One dose is recommended after age 53. Talk to your health care provider about which screenings and vaccines you need and how often you need them. This information is not intended to replace advice given to you by your health care provider. Make sure you discuss any questions you have with your health care provider. Document Released: 09/10/2015 Document Revised: 05/03/2016 Document Reviewed: 06/15/2015 Elsevier Interactive Patient Education  2017 [place] Prevention in the Home Falls can cause injuries. They can happen to people of all ages. There are many things you can do to make your home safe and to help prevent falls. What can I do on the outside of my home? Regularly fix the edges of walkways and driveways and fix any cracks. Remove anything that might make you trip as you walk through a door, such as a raised step or threshold. Trim any bushes or trees on the path to your home. Use bright outdoor lighting. Clear any walking paths of anything that might make someone trip, such as rocks or tools. Regularly check to see if handrails are loose or broken. Make sure that both sides of any steps have handrails. Any raised decks and porches should have guardrails on the edges. Have any leaves, snow, or ice cleared regularly. Use sand or salt on walking paths during winter. Clean up any spills in your garage right away. This includes oil or grease spills. What can I do in the bathroom? Use night lights. Install grab bars by the toilet and in the tub and shower. Do not use towel bars as grab bars. Use non-skid mats or decals in the tub or shower. If you need to sit down in the shower, use a plastic,  non-slip stool. Keep the floor dry. Clean up any water that spills on the floor as soon as it happens. Remove soap buildup in the tub or shower regularly. Attach bath mats securely with double-sided non-slip rug tape. Do not have throw rugs and other things on the floor that can make you trip. What can I do in the bedroom? Use night lights. Make sure that you have a light by your bed that is easy to reach. Do not use any sheets or blankets that are too big for your bed. They should not hang down onto the floor. Have a firm chair that has side arms. You can use this for support while you get dressed. Do not have throw rugs and other things on the floor that can make you trip. What can I do in the kitchen? Clean up any spills right away. Avoid walking on wet floors. Keep items that you use a lot in easy-to-reach places. If you need to reach something above you, use a strong step stool that has a grab bar. Keep electrical cords out of the way. Do not use floor polish or wax that makes floors slippery. If you must use wax, use non-skid floor wax. Do not have throw rugs and other things on the floor that can make you trip. What can I do with my stairs? Do not leave any items on the stairs. Make  sure that there are handrails on both sides of the stairs and use them. Fix handrails that are broken or loose. Make sure that handrails are as long as the stairways. Check any carpeting to make sure that it is firmly attached to the stairs. Fix any carpet that is loose or worn. Avoid having throw rugs at the top or bottom of the stairs. If you do have throw rugs, attach them to the floor with carpet tape. Make sure that you have a light switch at the top of the stairs and the bottom of the stairs. If you do not have them, ask someone to add them for you. What else can I do to help prevent falls? Wear shoes that: Do not have high heels. Have rubber bottoms. Are comfortable and fit you well. Are closed  at the toe. Do not wear sandals. If you use a stepladder: Make sure that it is fully opened. Do not climb a closed stepladder. Make sure that both sides of the stepladder are locked into place. Ask someone to hold it for you, if possible. Clearly mark and make sure that you can see: Any grab bars or handrails. First and last steps. Where the edge of each step is. Use tools that help you move around (mobility aids) if they are needed. These include: Canes. Walkers. Scooters. Crutches. Turn on the lights when you go into a dark area. Replace any light bulbs as soon as they burn out. Set up your furniture so you have a clear path. Avoid moving your furniture around. If any of your floors are uneven, fix them. If there are any pets around you, be aware of where they are. Review your medicines with your doctor. Some medicines can make you feel dizzy. This can increase your chance of falling. Ask your doctor what other things that you can do to help prevent falls. This information is not intended to replace advice given to you by your health care provider. Make sure you discuss any questions you have with your health care provider. Document Released: 06/10/2009 Document Revised: 01/20/2016 Document Reviewed: 09/18/2014 Elsevier Interactive Patient Education  2017 [employer].

## 2021-07-06 NOTE — Progress Notes (Signed)
Subjective:   Kelly Freeman is a 87 y.o. female who presents for an Initial Medicare Annual Wellness Visit.  Virtual Visit via Telephone Note  I connected with  Kelly Freeman on 07/06/21 at 11:15 AM EST by telephone and verified that I am speaking with the correct person using two identifiers.  Location: Patient: Home Provider: [facility] Persons participating in the virtual visit: patient/granddaughter, Allison/Nurse Health Advisor   I discussed the limitations, risks, security and privacy concerns of performing an evaluation and management service by telephone and the availability of in person appointments. The patient expressed understanding and agreed to proceed.  Interactive audio and video telecommunications were attempted between this nurse and patient, however failed, due to patient having technical difficulties OR patient did not have access to video capability.  We continued and completed visit with audio only.  Some vital signs may be absent or patient reported.   Daris Aristizabal E Loa Idler, LPN   Review of Systems     Cardiac Risk Factors include: advanced age (>41men, >85 women);sedentary lifestyle;dyslipidemia;hypertension;Other (see comment), Risk factor comments: hx of DVT, peripheral venous insufficiency, anemia, dementia     Objective:    Today's Vitals   07/06/21 1119  Weight: 132 lb (59.9 kg)  Height: 5\' 7"  (1.702 m)   Body mass index is 20.67 kg/m.  Advanced Directives 07/06/2021 06/06/2021 06/06/2021 05/21/2021 02/02/2021 01/06/2021 05/12/2020  Does Patient Have a Medical Advance Directive? Yes Yes Yes Yes Unable to assess, patient is non-responsive or altered mental status No No  Type of Advance Directive [place];Living will Out of facility DNR (pink MOST or yellow form) Out of facility DNR (pink MOST or yellow form) Out of facility DNR (pink MOST or yellow form) - - -  Does patient want to make changes to medical advance directive? - No - Guardian  declined - - - - -  Copy of Healthcare Power of Attorney in Chart? No - copy requested - - - - - -  Would patient like information on creating a medical advance directive? - - - - - No - Patient declined -  Pre-existing out of facility DNR order (yellow form or pink MOST form) - Yellow form placed in chart (order not valid for inpatient use) Yellow form placed in chart (order not valid for inpatient use) Yellow form placed in chart (order not valid for inpatient use) - - -    Current Medications (verified) Outpatient Encounter Medications as of 07/06/2021  Medication Sig   acetaminophen (TYLENOL) 325 MG tablet Take 2 tablets (650 mg total) by mouth every 8 (eight) hours.   amLODipine (NORVASC) 5 MG tablet Take 1 tablet (5 mg total) by mouth daily.   cephALEXin (KEFLEX) 250 MG capsule Take 250 mg by mouth daily.   cetirizine (ZYRTEC ALLERGY) 10 MG tablet Take 0.5 tablets (5 mg total) by mouth daily.   diclofenac Sodium (VOLTAREN) 1 % GEL Apply 1 application topically 4 (four) times daily.   ferrous sulfate 325 (65 FE) MG tablet Take 325 mg by mouth every morning.   furosemide (LASIX) 20 MG tablet Take 1 tablet (20 mg total) by mouth daily.   haloperidol (HALDOL) 2 MG/ML solution Take 1 mg by mouth 2 (two) times daily as needed for agitation.   magnesium oxide (MAG-OX) 400 (240 Mg) MG tablet Take 1 tablet (400 mg total) by mouth daily.   melatonin 5 MG TABS Take 5 mg by mouth at bedtime.   sertraline (ZOLOFT) 20 MG/ML concentrated solution  Take 60 mg by mouth every morning.   valproic acid (DEPAKENE) 250 MG/5ML solution Take 125 mg by mouth 3 (three) times daily.   zinc oxide (BALMEX) 11.3 % CREA cream Apply 1 application topically 2 (two) times daily.   methylPREDNISolone (MEDROL DOSEPAK) 4 MG TBPK tablet Use as directed. (Patient not taking: Reported on 07/06/2021)   No facility-administered encounter medications on file as of 07/06/2021.    Allergies (verified) Levofloxacin and Gabapentin    History: Past Medical History:  Diagnosis Date   Acute cystitis without hematuria    Acute diarrhea    Allergy    Anxiety    Blood transfusion without reported diagnosis 1949   Cancer ([place])    skin cancer on face   Cataract    bilateral   Chronic allergic rhinitis    CKD (chronic kidney disease) stage 3, GFR 30-59 ml/min (HCC)    Cognitive impairment    DDD (degenerative disc disease), lumbar    Dementia (HCC)    Dizziness    E. coli UTI    Fever    Gastroenteritis    GERD (gastroesophageal reflux disease)    Hereditary and idiopathic peripheral neuropathy    History of recurrent UTIs    Hypertension    Hypertension    MGUS (monoclonal gammopathy of unknown significance)    Migraine without aura and without status migrainosus, not intractable    Mixed incontinence urge and stress    Moderate episode of recurrent major depressive disorder (HCC)    Nausea and vomiting    Obesity    Obstructive sleep apnea    Osteopenia    Recurrent falls    Restless leg syndrome    Sleep apnea    UTI (urinary tract infection)    Past Surgical History:  Procedure Laterality Date   APPENDECTOMY     CHOLECYSTECTOMY     EYE SURGERY     bilateral cataracts removed   Family History  Problem Relation Age of Onset   Thrombosis Father 4       Coronary   Stomach cancer Mother 24   Arthritis Son 61   Arthritis Daughter 71   Cirrhosis Daughter 27       Stage 3    Hepatitis C Daughter    Social History   Socioeconomic History   Marital status: Widowed    Spouse name: Not on file   Number of children: 3   Years of education: 12   Highest education level: High school graduate  Occupational History   Occupation: retired  Tobacco Use   Smoking status: Never   Smokeless tobacco: Never  Vaping Use   Vaping Use: Never used  Substance and Sexual Activity   Alcohol use: Never   Drug use: Never   Sexual activity: Not Currently  Other Topics Concern   Not on file  Social  History Narrative   Lives in a memory care unit at ALF   Her granddaughter, Ebony Hail visits daily for several hours      Social Determinants of Health   Financial Resource Strain: Low Risk    Difficulty of Paying Living Expenses: Not hard at all  Food Insecurity: No Food Insecurity   Worried About Charity fundraiser in the Last Year: Never true   Arboriculturist in the Last Year: Never true  Transportation Needs: No Transportation Needs   Lack of Transportation (Medical): No   Lack of Transportation (Non-Medical): No  Physical Activity: Insufficiently Active  Days of Exercise per Week: 7 days   Minutes of Exercise per Session: 10 min  Stress: No Stress Concern Present   Feeling of Stress : Only a little  Social Connections: Moderately Integrated   Frequency of Communication with Friends and Family: Once a week   Frequency of Social Gatherings with Friends and Family: More than three times a week   Attends Religious Services: 1 to 4 times per year   Active Member of [employer] or Organizations: Yes   Attends Archivist Meetings: 1 to 4 times per year   Marital Status: Widowed    Tobacco Counseling Counseling given: Not Answered   Clinical Intake:  Pre-visit preparation completed: Yes  Pain : No/denies pain     BMI - recorded: 20.67 Nutritional Status: BMI of 19-24  Normal Nutritional Risks: Unintentional weight loss Diabetes: No  How often do you need to have someone help you when you read instructions, pamphlets, or other written materials from your doctor or pharmacy?: 4 - Often  Diabetic? no  Interpreter Needed?: No  Information entered by :: Derward Marple, LPN   Activities of Daily Living In your present state of health, do you have any difficulty performing the following activities: 07/06/2021 06/06/2021  Hearing? N N  Vision? N N  Difficulty concentrating or making decisions? Tempie Donning  Walking or climbing stairs? Y Y  Dressing or bathing? Y Y  Doing  errands, shopping? Tempie Donning  Preparing Food and eating ? Y -  Using the Toilet? Y -  In the past six months, have you accidently leaked urine? Y -  Do you have problems with loss of bowel control? Y -  Managing your Medications? Y -  Managing your Finances? Y -  Housekeeping or managing your Housekeeping? Y -  Some recent data might be hidden    Patient Care Team: [name], FNP as PCP - General (Family Medicine)  Indicate any recent Medical Services you may have received from other than [facility] providers in the past year (date may be approximate).     Assessment:   This is a routine wellness examination for Kelly Freeman.  Hearing/Vision screen Hearing Screening - Comments:: Denies hearing difficulties  Vision Screening - Comments:: Denies vision difficulties - no eye dr  Dietary issues and exercise activities discussed: Current Exercise Habits: Home exercise routine, Time (Minutes): 15, Frequency (Times/Week): 7, Weekly Exercise (Minutes/Week): 105, Intensity: Mild, Exercise limited by: orthopedic condition(s);psychological condition(s)   Goals Addressed             This Visit's Progress    Activity and Exercise Increased       Evidence-based guidance:  Review current exercise levels.  Assess patient perspective on exercise or activity level, barriers to increasing activity, motivation and readiness for change.  Recommend or set healthy exercise goal based on individual tolerance.  Encourage small steps toward making change in amount of exercise or activity.  Urge reduction of sedentary activities or screen time.  Promote group activities within the community or with family or support person.  Consider referral to rehabiliation therapist for assessment and exercise/activity plan.   Notes:        Depression Screen PHQ 2/9 Scores 07/06/2021 03/29/2021 02/24/2021 02/17/2021 02/01/2021 01/04/2021  PHQ - 2 Score 4 6 - 6 - -  PHQ- 9 Score 16 27 - 24 - -  Exception Documentation - -  Patient refusal - Medical reason Medical reason    Fall Risk Fall Risk  07/06/2021  03/29/2021 02/24/2021 02/17/2021 02/01/2021  Falls in the past year? 1 1 1 1 1   Number falls in past yr: 1 1 1 1 1   Injury with Fall? 1 1 1 1 1   Risk for fall due to : History of fall(s);Impaired balance/gait;Mental status change;Orthopedic patient History of fall(s);Impaired balance/gait;Impaired mobility History of fall(s) History of fall(s);Impaired balance/gait;Impaired mobility History of fall(s)  Follow up Education provided;Falls prevention discussed Falls evaluation completed Falls evaluation completed Falls evaluation completed Falls evaluation completed    FALL RISK PREVENTION PERTAINING TO THE HOME:  Any stairs in or around the home? No  If so, are there any without handrails? No  Home free of loose throw rugs in walkways, pet beds, electrical cords, etc? Yes  Adequate lighting in your home to reduce risk of falls? Yes   ASSISTIVE DEVICES UTILIZED TO PREVENT FALLS:  Life alert? Yes  Use of a cane, walker or w/c? Yes  Grab bars in the bathroom? Yes  Shower chair or bench in shower? Yes  Elevated toilet seat or a handicapped toilet? Yes   TIMED UP AND GO:  Was the test performed? No . Telephonic visit  Cognitive Function: Cognitive status assessed by direct observation. Patient has current diagnosis of cognitive impairment. Patient is followed by neurology for ongoing assessment. Patient is unable to complete screening 6CIT or MMSE.          Immunizations Immunization History  Administered Date(s) Administered   Influenza Split 06/16/2014   Influenza, High Dose Seasonal PF 06/15/2015, 07/05/2017   Influenza,inj,Quad PF,6+ Mos 09/11/2016   Influenza,trivalent, recombinat, inj, PF 05/29/2012, 06/18/2013   Influenza-Unspecified 05/15/2011   PPD Test 09/29/2019   Pneumococcal Conjugate-13 02/03/2015   Pneumococcal Polysaccharide-23 03/10/2013   Pneumococcal-Unspecified 08/28/2001   Td  08/28/2006, 03/14/2016   Zoster Recombinat (Shingrix) 05/31/2017   Zoster, Live 08/28/2008    TDAP status: Up to date  Flu Vaccine status: Up to date  Pneumococcal vaccine status: Up to date  Covid-19 vaccine status: Completed vaccines  Qualifies for Shingles Vaccine? Yes   Zostavax completed Yes   Shingrix Completed?: No.    Education has been provided regarding the importance of this vaccine. Patient has been advised to call insurance company to determine out of pocket expense if they have not yet received this vaccine. Advised may also receive vaccine at local pharmacy or Health Dept. Verbalized acceptance and understanding.  Screening Tests Health Maintenance  Topic Date Due   COVID-19 Vaccine (1) Never done   URINE MICROALBUMIN  Never done   INFLUENZA VACCINE  11/25/2021 (Originally 03/28/2021)   Zoster Vaccines- Shingrix (2 of 2) 05/04/2022 (Originally 07/26/2017)   TETANUS/TDAP  03/14/2026   Pneumonia Vaccine 39+ Years old  Completed   HPV VACCINES  Aged Out    Health Maintenance  Health Maintenance Due  Topic Date Due   COVID-19 Vaccine (1) Never done   URINE MICROALBUMIN  Never done    Colorectal cancer screening: No longer required.   Mammogram status: No longer required due to dementia and age.  Bone Density not required  Lung Cancer Screening: (Low Dose CT Chest recommended if Age 19-80 years, 30 pack-year currently smoking OR have quit w/in 15years.) does not qualify  Additional Screening:  Hepatitis C Screening: does not qualify  Vision Screening: Recommended annual ophthalmology exams for early detection of glaucoma and other disorders of the eye. Is the patient up to date with their annual eye exam?  No  Who is the provider or what is  the name of the office in which the patient attends annual eye exams? none If pt is not established with a provider, would they like to be referred to a provider to establish care? No .   Dental Screening: Recommended  annual dental exams for proper oral hygiene  Community Resource Referral / Chronic Care Management: CRR required this visit?  No   CCM required this visit?  No      Plan:     I have personally reviewed and noted the following in the patient's chart:   Medical and social history Use of alcohol, tobacco or illicit drugs  Current medications and supplements including opioid prescriptions. Patient is not currently taking opioid prescriptions. Functional ability and status Nutritional status Physical activity Advanced directives List of other physicians Hospitalizations, surgeries, and ER visits in previous 12 months Vitals Screenings to include cognitive, depression, and falls Referrals and appointments  In addition, I have reviewed and discussed with patient certain preventive protocols, quality metrics, and best practice recommendations. A written personalized care plan for preventive services as well as general preventive health recommendations were provided to patient.     Sandrea Hammond, LPN   [id number]   Nurse Notes: Patient could only answer her birthday, didn't answer any other questions appropriately, then walked off in the middle of visit and I finished speaking with her granddaughter.

## 2021-07-07 DIAGNOSIS — M6281 Muscle weakness (generalized): Secondary | ICD-10-CM | POA: Diagnosis not present

## 2021-07-07 DIAGNOSIS — L89312 Pressure ulcer of right buttock, stage 2: Secondary | ICD-10-CM | POA: Diagnosis not present

## 2021-07-07 DIAGNOSIS — N390 Urinary tract infection, site not specified: Secondary | ICD-10-CM | POA: Diagnosis not present

## 2021-07-07 DIAGNOSIS — G9341 Metabolic encephalopathy: Secondary | ICD-10-CM | POA: Diagnosis not present

## 2021-07-07 DIAGNOSIS — I129 Hypertensive chronic kidney disease with stage 1 through stage 4 chronic kidney disease, or unspecified chronic kidney disease: Secondary | ICD-10-CM | POA: Diagnosis not present

## 2021-07-07 DIAGNOSIS — N1832 Chronic kidney disease, stage 3b: Secondary | ICD-10-CM | POA: Diagnosis not present

## 2021-07-08 ENCOUNTER — Telehealth: Payer: Self-pay | Admitting: *Deleted

## 2021-07-08 DIAGNOSIS — L89312 Pressure ulcer of right buttock, stage 2: Secondary | ICD-10-CM | POA: Diagnosis not present

## 2021-07-08 DIAGNOSIS — I129 Hypertensive chronic kidney disease with stage 1 through stage 4 chronic kidney disease, or unspecified chronic kidney disease: Secondary | ICD-10-CM | POA: Diagnosis not present

## 2021-07-08 DIAGNOSIS — N1832 Chronic kidney disease, stage 3b: Secondary | ICD-10-CM | POA: Diagnosis not present

## 2021-07-08 DIAGNOSIS — M6281 Muscle weakness (generalized): Secondary | ICD-10-CM | POA: Diagnosis not present

## 2021-07-08 DIAGNOSIS — G9341 Metabolic encephalopathy: Secondary | ICD-10-CM | POA: Diagnosis not present

## 2021-07-08 DIAGNOSIS — N390 Urinary tract infection, site not specified: Secondary | ICD-10-CM | POA: Diagnosis not present

## 2021-07-08 NOTE — Telephone Encounter (Signed)
TC back to Kelly Freeman given VO for UA and C&S

## 2021-07-08 NOTE — Telephone Encounter (Signed)
TC from [place] w/ [facility] Pt sent a mssg to [facility] agency this morning Pt's urine is dark in color & has an odor HH would like an order to do an in & out cath for a UA They will drop this off at a [place] site since our office will be closed when it is collected today

## 2021-07-08 NOTE — Telephone Encounter (Signed)
Ok to do in & out cath for UA and urine culture.

## 2021-07-11 DIAGNOSIS — N1832 Chronic kidney disease, stage 3b: Secondary | ICD-10-CM | POA: Diagnosis not present

## 2021-07-11 DIAGNOSIS — M6281 Muscle weakness (generalized): Secondary | ICD-10-CM | POA: Diagnosis not present

## 2021-07-11 DIAGNOSIS — I129 Hypertensive chronic kidney disease with stage 1 through stage 4 chronic kidney disease, or unspecified chronic kidney disease: Secondary | ICD-10-CM | POA: Diagnosis not present

## 2021-07-11 DIAGNOSIS — G9341 Metabolic encephalopathy: Secondary | ICD-10-CM | POA: Diagnosis not present

## 2021-07-11 DIAGNOSIS — N390 Urinary tract infection, site not specified: Secondary | ICD-10-CM | POA: Diagnosis not present

## 2021-07-11 DIAGNOSIS — L89312 Pressure ulcer of right buttock, stage 2: Secondary | ICD-10-CM | POA: Diagnosis not present

## 2021-07-13 DIAGNOSIS — N390 Urinary tract infection, site not specified: Secondary | ICD-10-CM | POA: Diagnosis not present

## 2021-07-13 DIAGNOSIS — G9341 Metabolic encephalopathy: Secondary | ICD-10-CM | POA: Diagnosis not present

## 2021-07-13 DIAGNOSIS — N1832 Chronic kidney disease, stage 3b: Secondary | ICD-10-CM | POA: Diagnosis not present

## 2021-07-13 DIAGNOSIS — I129 Hypertensive chronic kidney disease with stage 1 through stage 4 chronic kidney disease, or unspecified chronic kidney disease: Secondary | ICD-10-CM | POA: Diagnosis not present

## 2021-07-13 DIAGNOSIS — M6281 Muscle weakness (generalized): Secondary | ICD-10-CM | POA: Diagnosis not present

## 2021-07-13 DIAGNOSIS — L89312 Pressure ulcer of right buttock, stage 2: Secondary | ICD-10-CM | POA: Diagnosis not present

## 2021-07-14 ENCOUNTER — Other Ambulatory Visit: Payer: Self-pay | Admitting: Family Medicine

## 2021-07-14 ENCOUNTER — Emergency Department (HOSPITAL_COMMUNITY)
Admission: EM | Admit: 2021-07-14 | Discharge: 2021-07-15 | Disposition: A | Discharge status: Home / Self Care | Payer: Medicare Other | Attending: Emergency Medicine | Admitting: Emergency Medicine

## 2021-07-14 ENCOUNTER — Other Ambulatory Visit: Payer: Medicare Other

## 2021-07-14 DIAGNOSIS — M25512 Pain in left shoulder: Secondary | ICD-10-CM | POA: Insufficient documentation

## 2021-07-14 DIAGNOSIS — R9431 Abnormal electrocardiogram [ECG] [EKG]: Secondary | ICD-10-CM | POA: Diagnosis not present

## 2021-07-14 DIAGNOSIS — W19XXXA Unspecified fall, initial encounter: Secondary | ICD-10-CM | POA: Diagnosis not present

## 2021-07-14 DIAGNOSIS — Z79899 Other long term (current) drug therapy: Secondary | ICD-10-CM | POA: Insufficient documentation

## 2021-07-14 DIAGNOSIS — E860 Dehydration: Secondary | ICD-10-CM | POA: Insufficient documentation

## 2021-07-14 DIAGNOSIS — I129 Hypertensive chronic kidney disease with stage 1 through stage 4 chronic kidney disease, or unspecified chronic kidney disease: Secondary | ICD-10-CM | POA: Insufficient documentation

## 2021-07-14 DIAGNOSIS — R0902 Hypoxemia: Secondary | ICD-10-CM | POA: Diagnosis not present

## 2021-07-14 DIAGNOSIS — M79603 Pain in arm, unspecified: Secondary | ICD-10-CM | POA: Diagnosis not present

## 2021-07-14 DIAGNOSIS — E039 Hypothyroidism, unspecified: Secondary | ICD-10-CM | POA: Insufficient documentation

## 2021-07-14 DIAGNOSIS — F03918 Unspecified dementia, unspecified severity, with other behavioral disturbance: Secondary | ICD-10-CM | POA: Diagnosis not present

## 2021-07-14 DIAGNOSIS — M542 Cervicalgia: Secondary | ICD-10-CM | POA: Diagnosis not present

## 2021-07-14 DIAGNOSIS — Z743 Need for continuous supervision: Secondary | ICD-10-CM | POA: Diagnosis not present

## 2021-07-14 DIAGNOSIS — Z20822 Contact with and (suspected) exposure to covid-19: Secondary | ICD-10-CM | POA: Insufficient documentation

## 2021-07-14 DIAGNOSIS — N1832 Chronic kidney disease, stage 3b: Secondary | ICD-10-CM | POA: Diagnosis not present

## 2021-07-14 DIAGNOSIS — R519 Headache, unspecified: Secondary | ICD-10-CM | POA: Insufficient documentation

## 2021-07-14 DIAGNOSIS — N390 Urinary tract infection, site not specified: Secondary | ICD-10-CM | POA: Diagnosis not present

## 2021-07-14 DIAGNOSIS — Z85828 Personal history of other malignant neoplasm of skin: Secondary | ICD-10-CM | POA: Insufficient documentation

## 2021-07-14 DIAGNOSIS — R6889 Other general symptoms and signs: Secondary | ICD-10-CM | POA: Diagnosis not present

## 2021-07-14 DIAGNOSIS — Y92129 Unspecified place in nursing home as the place of occurrence of the external cause: Secondary | ICD-10-CM | POA: Insufficient documentation

## 2021-07-14 DIAGNOSIS — R079 Chest pain, unspecified: Secondary | ICD-10-CM | POA: Diagnosis not present

## 2021-07-14 LAB — MICROSCOPIC EXAMINATION
Epithelial Cells (non renal): NONE SEEN /hpf (ref 0–10)
RBC, Urine: NONE SEEN /hpf (ref 0–2)
Renal Epithel, UA: NONE SEEN /hpf

## 2021-07-14 LAB — URINALYSIS, COMPLETE
Bilirubin, UA: NEGATIVE
Glucose, UA: NEGATIVE
Ketones, UA: NEGATIVE
Leukocytes,UA: NEGATIVE
Nitrite, UA: NEGATIVE
RBC, UA: NEGATIVE
Specific Gravity, UA: 1.02 (ref 1.005–1.030)
Urobilinogen, Ur: 0.2 mg/dL (ref 0.2–1.0)
pH, UA: 7.5 (ref 5.0–7.5)

## 2021-07-14 NOTE — ED Triage Notes (Signed)
Rcems from [facility] cc of fall. Pt was found down for unknown time on left side. Has dementia. Co left arm pain.   Has DNR

## 2021-07-15 ENCOUNTER — Emergency Department (HOSPITAL_COMMUNITY): Payer: Medicare Other

## 2021-07-15 ENCOUNTER — Encounter (HOSPITAL_COMMUNITY): Payer: Self-pay

## 2021-07-15 ENCOUNTER — Other Ambulatory Visit: Payer: Self-pay

## 2021-07-15 DIAGNOSIS — R519 Headache, unspecified: Secondary | ICD-10-CM | POA: Diagnosis not present

## 2021-07-15 DIAGNOSIS — M25512 Pain in left shoulder: Secondary | ICD-10-CM | POA: Diagnosis not present

## 2021-07-15 DIAGNOSIS — R079 Chest pain, unspecified: Secondary | ICD-10-CM | POA: Diagnosis not present

## 2021-07-15 DIAGNOSIS — M542 Cervicalgia: Secondary | ICD-10-CM | POA: Diagnosis not present

## 2021-07-15 LAB — CBC WITH DIFFERENTIAL/PLATELET
Abs Immature Granulocytes: 0.04 10*3/uL (ref 0.00–0.07)
Basophils Absolute: 0 10*3/uL (ref 0.0–0.1)
Basophils Relative: 1 %
Eosinophils Absolute: 0.2 10*3/uL (ref 0.0–0.5)
Eosinophils Relative: 3 %
HCT: 40.3 % (ref 36.0–46.0)
Hemoglobin: 12.7 g/dL (ref 12.0–15.0)
Immature Granulocytes: 1 %
Lymphocytes Relative: 21 %
Lymphs Abs: 1.8 10*3/uL (ref 0.7–4.0)
MCH: 30.8 pg (ref 26.0–34.0)
MCHC: 31.5 g/dL (ref 30.0–36.0)
MCV: 97.6 fL (ref 80.0–100.0)
Monocytes Absolute: 0.8 10*3/uL (ref 0.1–1.0)
Monocytes Relative: 9 %
Neutro Abs: 5.7 10*3/uL (ref 1.7–7.7)
Neutrophils Relative %: 65 %
Platelets: 201 10*3/uL (ref 150–400)
RBC: 4.13 MIL/uL (ref 3.87–5.11)
RDW: 12.6 % (ref 11.5–15.5)
WBC: 8.5 10*3/uL (ref 4.0–10.5)
nRBC: 0 % (ref 0.0–0.2)

## 2021-07-15 LAB — RESP PANEL BY RT-PCR (FLU A&B, COVID) ARPGX2
Influenza A by PCR: NEGATIVE
Influenza B by PCR: NEGATIVE
SARS Coronavirus 2 by RT PCR: NEGATIVE

## 2021-07-15 LAB — AMMONIA: Ammonia: <10 umol/L (ref 9–35)

## 2021-07-15 LAB — CBG MONITORING, ED: Glucose-Capillary: 115 mg/dL — ABNORMAL HIGH (ref 70–99)

## 2021-07-15 LAB — BASIC METABOLIC PANEL
Anion gap: 7 (ref 5–15)
BUN: 33 mg/dL — ABNORMAL HIGH (ref 8–23)
CO2: 29 mmol/L (ref 22–32)
Calcium: 9.3 mg/dL (ref 8.9–10.3)
Chloride: 102 mmol/L (ref 98–111)
Creatinine, Ser: 1.34 mg/dL — ABNORMAL HIGH (ref 0.44–1.00)
GFR, Estimated: 37 mL/min — ABNORMAL LOW (ref >60)
Glucose, Bld: 102 mg/dL — ABNORMAL HIGH (ref 70–99)
Potassium: 4.5 mmol/L (ref 3.5–5.1)
Sodium: 138 mmol/L (ref 135–145)

## 2021-07-15 LAB — VALPROIC ACID LEVEL: Valproic Acid Lvl: 11 ug/mL — ABNORMAL LOW (ref 50.0–100.0)

## 2021-07-15 NOTE — ED Notes (Signed)
Patient transported to CT 

## 2021-07-15 NOTE — ED Notes (Signed)
Tried contacting [facility] facility 4 times with no answer. Reached out to grandaughter and left phone number so that if facility has any questions they can give me a call.

## 2021-07-15 NOTE — ED Provider Notes (Signed)
[facility] EMERGENCY DEPARTMENT Provider Note   CSN: [id number] Arrival date & time: 07/14/21  2338     History Chief Complaint  Patient presents with   Fall   Level 5 caveat due to dementia Dara Beidleman is a 87 y.o. female.  The history is provided by the patient and a relative. The history is limited by the condition of the patient.  Fall This is a new problem. The problem occurs constantly. Nothing aggravates the symptoms. Nothing relieves the symptoms.     Patient with extensive history including chronic kidney disease, dementia presents with fall from nursing facility.  Granddaughter is at bedside who gives the whole history.  She reports patient was found face down for an unknown period of time.  Patient has been more drowsy recently.  She also reported left shoulder pain. Patient does take Haldol and valproic acid which could contribute to her sleepiness Past Medical History:  Diagnosis Date   Acute cystitis without hematuria    Acute diarrhea    Allergy    Anxiety    Blood transfusion without reported diagnosis 1947   Cancer ([place])    skin cancer on face   Cataract    bilateral   Chronic allergic rhinitis    CKD (chronic kidney disease) stage 3, GFR 30-59 ml/min (HCC)    Cognitive impairment    DDD (degenerative disc disease), lumbar    Dementia (HCC)    Dizziness    E. coli UTI    Fever    Gastroenteritis    GERD (gastroesophageal reflux disease)    Hereditary and idiopathic peripheral neuropathy    History of recurrent UTIs    Hypertension    Hypertension    MGUS (monoclonal gammopathy of unknown significance)    Migraine without aura and without status migrainosus, not intractable    Mixed incontinence urge and stress    Moderate episode of recurrent major depressive disorder (HCC)    Nausea and vomiting    Obesity    Obstructive sleep apnea    Osteopenia    Recurrent falls    Restless leg syndrome    Sleep apnea    UTI (urinary tract infection)      Patient Active Problem List   Diagnosis Date Noted   Abnormal gait 07/06/2021   Acquired hammer toe of right foot 07/06/2021   Anxiety disorder 07/06/2021   Chronic ulcer of lower extremity ([place]) 07/06/2021   Chronic ulcer of skin ([place]) 07/06/2021   Hyperlipidemia 07/06/2021   Hypothyroidism 07/06/2021   Insomnia 07/06/2021   Iron deficiency anemia 07/06/2021   Other vitamin B12 deficiency anemias 07/06/2021   Lymphedema 07/06/2021   Major depression, single episode 07/06/2021   Peripheral venous insufficiency 07/06/2021   Urinary incontinence 07/06/2021   Vitamin D deficiency 75/91/6384   Acute metabolic encephalopathy 66/59/9357   Stage 3b chronic kidney disease (CKD) ([place]) 06/06/2021   DDD (degenerative disc disease), lumbar 05/17/2021   Recurrent UTI 02/01/2021   Dementia with behavioral disturbance 01/04/2021   Pain    Palliative care by specialist    Goals of care, counseling/discussion    Hematoma, nontraumatic, soft tissue 12/18/2020   Fall at home, initial encounter 12/18/2020   History of DVT (deep vein thrombosis) 12/18/2020   Acute deep vein thrombosis (DVT) of right lower extremity ([place]) 03/31/2020   Recurrent falls    Moderate episode of recurrent major depressive disorder (HCC)    MGUS (monoclonal gammopathy of unknown significance)    Primary hypertension  History of recurrent UTIs    CKD (chronic kidney disease) stage 3, GFR 30-59 ml/min (HCC)     Past Surgical History:  Procedure Laterality Date   APPENDECTOMY     CHOLECYSTECTOMY     EYE SURGERY     bilateral cataracts removed     OB History   No obstetric history on file.     Family History  Problem Relation Age of Onset   Thrombosis Father 50       Coronary   Stomach cancer Mother 55   Arthritis Son 39   Arthritis Daughter 11   Cirrhosis Daughter 53       Stage 3    Hepatitis C Daughter     Social History   Tobacco Use   Smoking status: Never   Smokeless tobacco: Never   Vaping Use   Vaping Use: Never used  Substance Use Topics   Alcohol use: Never   Drug use: Never    Home Medications Prior to Admission medications   Medication Sig Start Date End Date Taking? Authorizing Provider  acetaminophen (TYLENOL) 325 MG tablet Take 2 tablets (650 mg total) by mouth every 8 (eight) hours. 01/04/21   [name], FNP  amLODipine (NORVASC) 5 MG tablet Take 1 tablet (5 mg total) by mouth daily. 06/08/21   Orson Eva, MD  cephALEXin (KEFLEX) 250 MG capsule Take 250 mg by mouth daily.    [provider]  cetirizine (ZYRTEC ALLERGY) 10 MG tablet Take 0.5 tablets (5 mg total) by mouth daily. 03/04/21   [name], FNP  diclofenac Sodium (VOLTAREN) 1 % GEL Apply 1 application topically 4 (four) times daily. 04/08/21   [provider]  ferrous sulfate 325 (65 FE) MG tablet Take 325 mg by mouth every morning.    [provider]  furosemide (LASIX) 20 MG tablet Take 1 tablet (20 mg total) by mouth daily. 02/01/21   [name], FNP  haloperidol (HALDOL) 2 MG/ML solution Take 1 mg by mouth 2 (two) times daily as needed for agitation.    [provider]  magnesium oxide (MAG-OX) 400 (240 Mg) MG tablet Take 1 tablet (400 mg total) by mouth daily. 06/07/21   Orson Eva, MD  melatonin 5 MG TABS Take 5 mg by mouth at bedtime.    [provider]  methylPREDNISolone (MEDROL DOSEPAK) 4 MG TBPK tablet Use as directed. Patient not taking: Reported on 07/06/2021 06/09/21   Loman Brooklyn, FNP  sertraline (ZOLOFT) 20 MG/ML concentrated solution Take 60 mg by mouth every morning.    [provider]  valproic acid (DEPAKENE) 250 MG/5ML solution Take 125 mg by mouth 3 (three) times daily.    [provider]  zinc oxide (BALMEX) 11.3 % CREA cream Apply 1 application topically 2 (two) times daily. 01/19/21   [name], FNP    Allergies    Levofloxacin and Gabapentin  Review of Systems   Review of Systems   Unable to perform ROS: Dementia   Physical Exam Updated Vital Signs BP (!) 158/55   Pulse 67   Temp 98 F (36.7 C)   Resp 13   Ht 1.702 m (5\' 7" )   Wt 59.8 kg   SpO2 98%   BMI 20.65 kg/m   Physical Exam CONSTITUTIONAL: Elderly HEAD: Normocephalic/atraumatic EYES: EOMI/PERRL ENMT: Mucous membranes moist, poor dentition CV: S1/S2 noted, no murmurs/rubs/gallops noted LUNGS: Lungs are clear to auscultation bilaterally, no apparent distress ABDOMEN: soft, nontender, nondistended  NEURO: Pt is somnolent.  She does not respond to voice.  Patient responds to pain EXTREMITIES: pulses normal/equal, tenderness to palpation of left shoulder.  She grimaces in pain when I range both hips. All other extremities/joints palpated/ranged and nontender SKIN: warm, color normal PSYCH: Unable to assess  ED Results / Procedures / Treatments   Labs (all labs ordered are listed, but only abnormal results are displayed) Labs Reviewed  BASIC METABOLIC PANEL - Abnormal; Notable for the following components:      Result Value   Glucose, Bld 102 (*)    BUN 33 (*)    Creatinine, Ser 1.34 (*)    GFR, Estimated 37 (*)    All other components within normal limits  VALPROIC ACID LEVEL - Abnormal; Notable for the following components:   Valproic Acid Lvl 11 (*)    All other components within normal limits  CBG MONITORING, ED - Abnormal; Notable for the following components:   Glucose-Capillary 115 (*)    All other components within normal limits  RESP PANEL BY RT-PCR (FLU A&B, COVID) ARPGX2  CBC WITH DIFFERENTIAL/PLATELET  AMMONIA    EKG EKG Interpretation  Date/Time:  Friday July 15 2021 01:13:55 EST Ventricular Rate:  79 PR Interval:    QRS Duration: 110 QT Interval:  415 QTC Calculation: 425 R Axis:   88 Text Interpretation: Interpretation limited secondary to artifact indeterminate, likely sinus Anteroseptal infarct, age indeterminate Confirmed by Ripley Fraise [phone]) on  07/15/2021 1:33:52 AM  Radiology CT Head Wo Contrast  Result Date: 07/15/2021 CLINICAL DATA:  Recent fall with headaches and neck pain, initial encounter EXAM: CT HEAD WITHOUT CONTRAST CT CERVICAL SPINE WITHOUT CONTRAST TECHNIQUE: Multidetector CT imaging of the head and cervical spine was performed following the standard protocol without intravenous contrast. Multiplanar CT image reconstructions of the cervical spine were also generated. COMPARISON:  06/06/2021, 05/13/2020 FINDINGS: CT HEAD FINDINGS Brain: No evidence of acute infarction, hemorrhage, hydrocephalus, extra-axial collection or mass lesion/mass effect. Chronic atrophic changes and white matter ischemic changes are again noted. 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 Vascular: No hyperdense vessel or unexpected calcification. Skull: Normal. Negative for fracture or focal lesion. Sinuses/Orbits: No acute finding. Other: None CT CERVICAL SPINE FINDINGS Alignment: Alignment is within normal limits. Skull base and vertebrae: 7 cervical segments are well visualized. Vertebral body height is well maintained. No acute fracture is identified. Multilevel facet hypertrophic changes are seen. Irregularity along the posterior aspect of C-6 is noted with mild height loss stable in appearance from a prior exam from 2021 consistent with prior fracture. Facet hypertrophic changes are noted. No acute fracture or acute facet abnormality is noted. 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 Soft tissues and spinal canal: Surrounding soft tissue structures are within normal limits. Diffuse vascular calcifications are seen. Upper chest: Visualized lung apices are unremarkable. Previously seen nodule in the right upper lobe has decreased in size consistent with a benign etiology. Other: None IMPRESSION: CT of the head: Chronic atrophic and ischemic changes. No acute abnormality noted. CT of the cervical spine: Multilevel degenerative change without acute  abnormality. Changes of prior fracture at C6 are seen stable from 2021. Electronically Signed   By: Inez Catalina M.D.   On: 07/15/2021 01:04   CT Cervical Spine Wo Contrast  Result Date: 07/15/2021 CLINICAL DATA:  Recent fall with headaches and neck pain, initial encounter EXAM: CT HEAD WITHOUT CONTRAST CT CERVICAL SPINE WITHOUT CONTRAST TECHNIQUE: Multidetector CT imaging of the head and  cervical spine was performed following the standard protocol without intravenous contrast. Multiplanar CT image reconstructions of the cervical spine were also generated. COMPARISON:  06/06/2021, 05/13/2020 FINDINGS: CT HEAD FINDINGS Brain: No evidence of acute infarction, hemorrhage, hydrocephalus, extra-axial collection or mass lesion/mass effect. Chronic atrophic changes and white matter ischemic changes are again noted. 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 Vascular: No hyperdense vessel or unexpected calcification. Skull: Normal. Negative for fracture or focal lesion. Sinuses/Orbits: No acute finding. Other: None CT CERVICAL SPINE FINDINGS Alignment: Alignment is within normal limits. Skull base and vertebrae: 7 cervical segments are well visualized. Vertebral body height is well maintained. No acute fracture is identified. Multilevel facet hypertrophic changes are seen. Irregularity along the posterior aspect of C-6 is noted with mild height loss stable in appearance from a prior exam from 2021 consistent with prior fracture. Facet hypertrophic changes are noted. No acute fracture or acute facet abnormality is noted. 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 Soft tissues and spinal canal: Surrounding soft tissue structures are within normal limits. Diffuse vascular calcifications are seen. Upper chest: Visualized lung apices are unremarkable. Previously seen nodule in the right upper lobe has decreased in size consistent with a benign etiology. Other: None IMPRESSION: CT of the head: Chronic atrophic  and ischemic changes. No acute abnormality noted. CT of the cervical spine: Multilevel degenerative change without acute abnormality. Changes of prior fracture at C6 are seen stable from 2021. Electronically Signed   By: Inez Catalina M.D.   On: 07/15/2021 01:04   DG Pelvis Portable  Result Date: 07/15/2021 CLINICAL DATA:  Recent fall with pelvic pain, initial encounter EXAM: PORTABLE PELVIS 2 VIEWS COMPARISON:  05/21/2021 FINDINGS: Pelvic ring is intact. Degenerative changes of the hip joints are noted bilaterally. Lumbar spine degenerative changes are seen as well. No acute fracture or dislocation is noted. No soft tissue abnormality is seen. IMPRESSION: Degenerative change without acute abnormality. Electronically Signed   By: Inez Catalina M.D.   On: 07/15/2021 01:12   DG Chest Port 1 View  Result Date: 07/15/2021 CLINICAL DATA:  Chest pain following recent fall, initial encounter EXAM: PORTABLE CHEST 1 VIEW COMPARISON:  06/06/2021 FINDINGS: Cardiac shadow is enlarged but stable. Aortic calcifications are noted. Lungs are well aerated bilaterally. Coarsened interstitial changes are again noted and stable. No focal infiltrate or effusion is seen. No acute bony abnormality is noted. IMPRESSION: No active disease. Electronically Signed   By: Inez Catalina M.D.   On: 07/15/2021 01:14   DG Shoulder Left Port  Result Date: 07/15/2021 CLINICAL DATA:  Recent fall with left shoulder pain, initial encounter EXAM: LEFT SHOULDER COMPARISON:  None. FINDINGS: Clavicle appears within normal limits. No acute fracture or dislocation is seen. Underlying bony thorax appears within normal limits. No soft tissue changes are seen. IMPRESSION: No acute abnormality noted. Electronically Signed   By: Inez Catalina M.D.   On: 07/15/2021 01:13    Procedures Procedures   Medications Ordered in ED Medications - No data to display  ED Course  I have reviewed the triage vital signs and the nursing notes.  Pertinent labs  & imaging results that were available during my care of the patient were reviewed by me and considered in my medical decision making (see chart for details).    MDM Rules/Calculators/A&P                           Patient with history of  dementia presents after a fall being found on the floor.  On initial exam, patient was somnolent and minimally reactive. Extensive labs and imaging performed without significant findings except for mild dehydration Patient is now awake and alert but appears very upset and is crying.  Granddaughter is at bedside who watches her every day in the nursing facility Granddaughter feels comfortable taking patient back to facility.  She is back to her baseline Patient be discharged  Final Clinical Impression(s) / ED Diagnoses Final diagnoses:  Fall, initial encounter  Dehydration    Rx / DC Orders ED Discharge Orders     None        Ripley Fraise, MD 07/15/21 [phone]

## 2021-07-19 ENCOUNTER — Telehealth: Payer: Self-pay | Admitting: *Deleted

## 2021-07-19 DIAGNOSIS — N1832 Chronic kidney disease, stage 3b: Secondary | ICD-10-CM | POA: Diagnosis not present

## 2021-07-19 DIAGNOSIS — I129 Hypertensive chronic kidney disease with stage 1 through stage 4 chronic kidney disease, or unspecified chronic kidney disease: Secondary | ICD-10-CM | POA: Diagnosis not present

## 2021-07-19 DIAGNOSIS — N390 Urinary tract infection, site not specified: Secondary | ICD-10-CM | POA: Diagnosis not present

## 2021-07-19 DIAGNOSIS — G9341 Metabolic encephalopathy: Secondary | ICD-10-CM | POA: Diagnosis not present

## 2021-07-19 DIAGNOSIS — M6281 Muscle weakness (generalized): Secondary | ICD-10-CM | POA: Diagnosis not present

## 2021-07-19 DIAGNOSIS — L89312 Pressure ulcer of right buttock, stage 2: Secondary | ICD-10-CM | POA: Diagnosis not present

## 2021-07-19 NOTE — Telephone Encounter (Signed)
Kathlee Nations with [facility] - calling in with FYI   Pt had a Fall last Thursday and was sent to the ER to be checked out - everything is ok, just wanted to report it to PCP.   FYI

## 2021-07-25 ENCOUNTER — Other Ambulatory Visit: Payer: Self-pay | Admitting: Family Medicine

## 2021-07-25 DIAGNOSIS — N390 Urinary tract infection, site not specified: Secondary | ICD-10-CM

## 2021-07-25 DIAGNOSIS — L89312 Pressure ulcer of right buttock, stage 2: Secondary | ICD-10-CM | POA: Diagnosis not present

## 2021-07-25 DIAGNOSIS — G9341 Metabolic encephalopathy: Secondary | ICD-10-CM | POA: Diagnosis not present

## 2021-07-25 DIAGNOSIS — N1832 Chronic kidney disease, stage 3b: Secondary | ICD-10-CM | POA: Diagnosis not present

## 2021-07-25 DIAGNOSIS — I129 Hypertensive chronic kidney disease with stage 1 through stage 4 chronic kidney disease, or unspecified chronic kidney disease: Secondary | ICD-10-CM | POA: Diagnosis not present

## 2021-07-25 DIAGNOSIS — M6281 Muscle weakness (generalized): Secondary | ICD-10-CM | POA: Diagnosis not present

## 2021-07-25 LAB — URINE CULTURE

## 2021-07-25 MED ORDER — LINEZOLID 600 MG PO TABS: 600.0000 mg | ORAL_TABLET | Freq: Two times a day (BID) | ORAL | 0 refills | Status: AC

## 2021-07-25 NOTE — Progress Notes (Signed)
R/C about urine culture

## 2021-08-01 ENCOUNTER — Ambulatory Visit: Payer: Medicare Other | Admitting: Family Medicine

## 2021-08-02 ENCOUNTER — Encounter: Payer: Self-pay | Admitting: *Deleted

## 2021-08-03 DIAGNOSIS — N390 Urinary tract infection, site not specified: Secondary | ICD-10-CM | POA: Diagnosis not present

## 2021-08-03 DIAGNOSIS — N1832 Chronic kidney disease, stage 3b: Secondary | ICD-10-CM | POA: Diagnosis not present

## 2021-08-03 DIAGNOSIS — L89312 Pressure ulcer of right buttock, stage 2: Secondary | ICD-10-CM | POA: Diagnosis not present

## 2021-08-03 DIAGNOSIS — M6281 Muscle weakness (generalized): Secondary | ICD-10-CM | POA: Diagnosis not present

## 2021-08-03 DIAGNOSIS — G9341 Metabolic encephalopathy: Secondary | ICD-10-CM | POA: Diagnosis not present

## 2021-08-03 DIAGNOSIS — I129 Hypertensive chronic kidney disease with stage 1 through stage 4 chronic kidney disease, or unspecified chronic kidney disease: Secondary | ICD-10-CM | POA: Diagnosis not present

## 2021-08-04 DIAGNOSIS — L89312 Pressure ulcer of right buttock, stage 2: Secondary | ICD-10-CM | POA: Diagnosis not present

## 2021-08-04 DIAGNOSIS — G9341 Metabolic encephalopathy: Secondary | ICD-10-CM | POA: Diagnosis not present

## 2021-08-04 DIAGNOSIS — N1832 Chronic kidney disease, stage 3b: Secondary | ICD-10-CM | POA: Diagnosis not present

## 2021-08-04 DIAGNOSIS — M6281 Muscle weakness (generalized): Secondary | ICD-10-CM | POA: Diagnosis not present

## 2021-08-04 DIAGNOSIS — I129 Hypertensive chronic kidney disease with stage 1 through stage 4 chronic kidney disease, or unspecified chronic kidney disease: Secondary | ICD-10-CM | POA: Diagnosis not present

## 2021-08-04 DIAGNOSIS — N390 Urinary tract infection, site not specified: Secondary | ICD-10-CM | POA: Diagnosis not present

## 2021-08-08 DIAGNOSIS — N1832 Chronic kidney disease, stage 3b: Secondary | ICD-10-CM | POA: Diagnosis not present

## 2021-08-08 DIAGNOSIS — N390 Urinary tract infection, site not specified: Secondary | ICD-10-CM | POA: Diagnosis not present

## 2021-08-08 DIAGNOSIS — I129 Hypertensive chronic kidney disease with stage 1 through stage 4 chronic kidney disease, or unspecified chronic kidney disease: Secondary | ICD-10-CM | POA: Diagnosis not present

## 2021-08-08 DIAGNOSIS — G9341 Metabolic encephalopathy: Secondary | ICD-10-CM | POA: Diagnosis not present

## 2021-08-08 DIAGNOSIS — Z9181 History of falling: Secondary | ICD-10-CM | POA: Diagnosis not present

## 2021-08-08 DIAGNOSIS — K5900 Constipation, unspecified: Secondary | ICD-10-CM | POA: Diagnosis not present

## 2021-08-08 DIAGNOSIS — M6281 Muscle weakness (generalized): Secondary | ICD-10-CM | POA: Diagnosis not present

## 2021-08-08 DIAGNOSIS — Z792 Long term (current) use of antibiotics: Secondary | ICD-10-CM | POA: Diagnosis not present

## 2021-08-11 DIAGNOSIS — K5900 Constipation, unspecified: Secondary | ICD-10-CM | POA: Diagnosis not present

## 2021-08-11 DIAGNOSIS — N1832 Chronic kidney disease, stage 3b: Secondary | ICD-10-CM | POA: Diagnosis not present

## 2021-08-11 DIAGNOSIS — Z9181 History of falling: Secondary | ICD-10-CM | POA: Diagnosis not present

## 2021-08-11 DIAGNOSIS — N390 Urinary tract infection, site not specified: Secondary | ICD-10-CM | POA: Diagnosis not present

## 2021-08-11 DIAGNOSIS — M6281 Muscle weakness (generalized): Secondary | ICD-10-CM | POA: Diagnosis not present

## 2021-08-11 DIAGNOSIS — I129 Hypertensive chronic kidney disease with stage 1 through stage 4 chronic kidney disease, or unspecified chronic kidney disease: Secondary | ICD-10-CM | POA: Diagnosis not present

## 2021-08-11 DIAGNOSIS — G9341 Metabolic encephalopathy: Secondary | ICD-10-CM | POA: Diagnosis not present

## 2021-08-11 DIAGNOSIS — Z792 Long term (current) use of antibiotics: Secondary | ICD-10-CM | POA: Diagnosis not present

## 2021-08-19 ENCOUNTER — Other Ambulatory Visit: Payer: Self-pay

## 2021-08-19 ENCOUNTER — Ambulatory Visit (INDEPENDENT_AMBULATORY_CARE_PROVIDER_SITE_OTHER): Payer: Medicare Other | Admitting: Family

## 2021-08-19 ENCOUNTER — Emergency Department (HOSPITAL_COMMUNITY): Payer: Medicare Other

## 2021-08-19 ENCOUNTER — Encounter (HOSPITAL_COMMUNITY): Payer: Self-pay

## 2021-08-19 ENCOUNTER — Emergency Department (HOSPITAL_COMMUNITY)
Admission: EM | Admit: 2021-08-19 | Discharge: 2021-08-20 | Disposition: A | Discharge status: Home / Self Care | Payer: Medicare Other | Attending: Emergency Medicine | Admitting: Emergency Medicine

## 2021-08-19 ENCOUNTER — Encounter: Payer: Self-pay | Admitting: Family

## 2021-08-19 ENCOUNTER — Telehealth: Payer: Self-pay | Admitting: Family Medicine

## 2021-08-19 DIAGNOSIS — G9341 Metabolic encephalopathy: Secondary | ICD-10-CM | POA: Diagnosis not present

## 2021-08-19 DIAGNOSIS — U071 COVID-19: Secondary | ICD-10-CM | POA: Insufficient documentation

## 2021-08-19 DIAGNOSIS — Z853 Personal history of malignant neoplasm of breast: Secondary | ICD-10-CM | POA: Diagnosis not present

## 2021-08-19 DIAGNOSIS — F03918 Unspecified dementia, unspecified severity, with other behavioral disturbance: Secondary | ICD-10-CM | POA: Diagnosis not present

## 2021-08-19 DIAGNOSIS — I129 Hypertensive chronic kidney disease with stage 1 through stage 4 chronic kidney disease, or unspecified chronic kidney disease: Secondary | ICD-10-CM | POA: Diagnosis not present

## 2021-08-19 DIAGNOSIS — E039 Hypothyroidism, unspecified: Secondary | ICD-10-CM | POA: Insufficient documentation

## 2021-08-19 DIAGNOSIS — J069 Acute upper respiratory infection, unspecified: Secondary | ICD-10-CM | POA: Diagnosis not present

## 2021-08-19 DIAGNOSIS — N1832 Chronic kidney disease, stage 3b: Secondary | ICD-10-CM | POA: Insufficient documentation

## 2021-08-19 DIAGNOSIS — R531 Weakness: Secondary | ICD-10-CM | POA: Diagnosis not present

## 2021-08-19 DIAGNOSIS — I10 Essential (primary) hypertension: Secondary | ICD-10-CM | POA: Diagnosis not present

## 2021-08-19 DIAGNOSIS — M6281 Muscle weakness (generalized): Secondary | ICD-10-CM | POA: Diagnosis not present

## 2021-08-19 DIAGNOSIS — Z9181 History of falling: Secondary | ICD-10-CM | POA: Diagnosis not present

## 2021-08-19 DIAGNOSIS — K5900 Constipation, unspecified: Secondary | ICD-10-CM | POA: Diagnosis not present

## 2021-08-19 DIAGNOSIS — Z792 Long term (current) use of antibiotics: Secondary | ICD-10-CM | POA: Diagnosis not present

## 2021-08-19 DIAGNOSIS — N390 Urinary tract infection, site not specified: Secondary | ICD-10-CM | POA: Diagnosis not present

## 2021-08-19 LAB — CBC WITH DIFFERENTIAL/PLATELET
Abs Immature Granulocytes: 0.05 10*3/uL (ref 0.00–0.07)
Basophils Absolute: 0 10*3/uL (ref 0.0–0.1)
Basophils Relative: 0 %
Eosinophils Absolute: 0.1 10*3/uL (ref 0.0–0.5)
Eosinophils Relative: 1 %
HCT: 38.2 % (ref 36.0–46.0)
Hemoglobin: 12.1 g/dL (ref 12.0–15.0)
Immature Granulocytes: 1 %
Lymphocytes Relative: 13 %
Lymphs Abs: 1.2 10*3/uL (ref 0.7–4.0)
MCH: 30.5 pg (ref 26.0–34.0)
MCHC: 31.7 g/dL (ref 30.0–36.0)
MCV: 96.2 fL (ref 80.0–100.0)
Monocytes Absolute: 1.1 10*3/uL — ABNORMAL HIGH (ref 0.1–1.0)
Monocytes Relative: 12 %
Neutro Abs: 6.5 10*3/uL (ref 1.7–7.7)
Neutrophils Relative %: 73 %
Platelets: 197 10*3/uL (ref 150–400)
RBC: 3.97 MIL/uL (ref 3.87–5.11)
RDW: 12.9 % (ref 11.5–15.5)
WBC: 9 10*3/uL (ref 4.0–10.5)
nRBC: 0 % (ref 0.0–0.2)

## 2021-08-19 LAB — COMPREHENSIVE METABOLIC PANEL
ALT: 18 U/L (ref 0–44)
AST: 41 U/L (ref 15–41)
Albumin: 3.1 g/dL — ABNORMAL LOW (ref 3.5–5.0)
Alkaline Phosphatase: 56 U/L (ref 38–126)
Anion gap: 8 (ref 5–15)
BUN: 38 mg/dL — ABNORMAL HIGH (ref 8–23)
CO2: 28 mmol/L (ref 22–32)
Calcium: 9.6 mg/dL (ref 8.9–10.3)
Chloride: 105 mmol/L (ref 98–111)
Creatinine, Ser: 1.32 mg/dL — ABNORMAL HIGH (ref 0.44–1.00)
GFR, Estimated: 38 mL/min — ABNORMAL LOW (ref >60)
Glucose, Bld: 90 mg/dL (ref 70–99)
Potassium: 4.2 mmol/L (ref 3.5–5.1)
Sodium: 141 mmol/L (ref 135–145)
Total Bilirubin: 0.9 mg/dL (ref 0.3–1.2)
Total Protein: 6.1 g/dL — ABNORMAL LOW (ref 6.5–8.1)

## 2021-08-19 LAB — RESP PANEL BY RT-PCR (FLU A&B, COVID) ARPGX2
Influenza A by PCR: NEGATIVE
Influenza B by PCR: NEGATIVE
SARS Coronavirus 2 by RT PCR: POSITIVE — AB

## 2021-08-19 MED ORDER — NIRMATRELVIR/RITONAVIR (PAXLOVID) TABLET (RENAL DOSING): 2.0000 | ORAL_TABLET | Freq: Two times a day (BID) | ORAL | Status: DC

## 2021-08-19 MED ORDER — BENZONATATE 200 MG PO CAPS: 200.0000 mg | ORAL_CAPSULE | Freq: Three times a day (TID) | ORAL | 1 refills | Status: DC | PRN

## 2021-08-19 MED ORDER — FLUTICASONE PROPIONATE 50 MCG/ACT NA SUSP: 2.0000 | Freq: Every day | NASAL | 6 refills | Status: DC

## 2021-08-19 MED ORDER — POLYETHYLENE GLYCOL 3350 17 GM/SCOOP PO POWD: 17.0000 g | Freq: Two times a day (BID) | ORAL | 1 refills | Status: AC | PRN

## 2021-08-19 MED ORDER — SODIUM CHLORIDE 0.9 % IV BOLUS: 500.0000 mL | Freq: Once | INTRAVENOUS | Status: DC

## 2021-08-19 MED ORDER — MOLNUPIRAVIR EUA 200MG CAPSULE: 4.0000 | ORAL_CAPSULE | Freq: Two times a day (BID) | ORAL | 0 refills | Status: AC

## 2021-08-19 MED ORDER — MOLNUPIRAVIR EUA 200MG CAPSULE
4.0000 | ORAL_CAPSULE | Freq: Two times a day (BID) | ORAL | Status: DC
  Administered 2021-08-20: 01:00:00 800 mg via ORAL
  Filled 2021-08-19: qty 4

## 2021-08-19 NOTE — Telephone Encounter (Signed)
Jan can you call [facility] patient [facility] agency to get these verbal orders in I do not have their contact information.

## 2021-08-19 NOTE — Discharge Instructions (Signed)
He was seen in the emergency room today with fatigue.  Your COVID test does come back positive.  We are starting on antiviral medication for the next 5 days.  Please continue to drink fluids and take your antiviral medications.  Return with any new or suddenly worsening symptoms.

## 2021-08-19 NOTE — Telephone Encounter (Signed)
Yes this is fine. Can we give them a verbal order or do we need to fax?

## 2021-08-19 NOTE — Telephone Encounter (Signed)
Patient aware and verbalized understanding. °

## 2021-08-19 NOTE — Progress Notes (Signed)
Virtual Visit  Note Due to COVID-19 pandemic this visit was conducted virtually. This visit type was conducted due to national recommendations for restrictions regarding the COVID-19 Pandemic (e.g. social distancing, sheltering in place) in an effort to limit this patient's exposure and mitigate transmission in our community. All issues noted in this document were discussed and addressed.  A physical exam was not performed with this format.  I connected with Kelly Freeman on 08/19/21 at 9:05 AM by telephone and verified that I am speaking with the correct person using two identifiers. Kelly Freeman is currently located at nursing home and granddaughter  is currently with her during visit. The provider, [name] Dun, FNP is located in their office at time of visit.  I discussed the limitations, risks, security and privacy concerns of performing an evaluation and management service by telephone and the availability of in person appointments. I also discussed with the patient that there may be a patient responsible charge related to this service. The patient expressed understanding and agreed to proceed.  [name], Kelly Freeman are scheduled for a virtual visit with your provider today.    Just as we do with appointments in the office, we must obtain your consent to participate.  Your consent will be active for this visit and any virtual visit you may have with one of our providers in the next 365 days.    If you have a MyChart account, I can also send a copy of this consent to you electronically.  All virtual visits are billed to your insurance company just like a traditional visit in the office.  As this is a virtual visit, video technology does not allow for your provider to perform a traditional examination.  This may limit your provider's ability to fully assess your condition.  If your provider identifies any concerns that need to be evaluated in person or the need to arrange testing such as labs, EKG,  etc, we will make arrangements to do so.    Although advances in technology are sophisticated, we cannot ensure that it will always work on either your end or our end.  If the connection with a video visit is poor, we may have to switch to a telephone visit.  With either a video or telephone visit, we are not always able to ensure that we have a secure connection.   I need to obtain your verbal consent now.   Are you willing to proceed with your visit today?   Kelly Freeman has provided verbal consent on 08/19/2021 for a virtual visit (video or telephone).   [name] Dun, [place] 08/19/2021  9:09 AM    History and Present Illness:  Constipation This is a new problem. The current episode started more than 1 year ago. The problem has been waxing and waning since onset. Her stool frequency is 2 to 3 times per week. The stool is described as formed and firm. Pertinent negatives include no fever. She has tried laxatives for the symptoms. The treatment provided mild relief.  Cough This is a new problem. The current episode started in the past 7 days. The problem has been gradually worsening. The problem occurs every few minutes. The cough is Non-productive. Associated symptoms include chills, nasal congestion and postnasal drip. Pertinent negatives include no ear congestion, ear pain, fever, headaches, myalgias, sore throat, shortness of breath or wheezing. She has tried rest for the symptoms. The treatment provided mild relief.     Review of Systems  Constitutional:  Positive  for chills. Negative for fever.  HENT:  Positive for postnasal drip. Negative for ear pain and sore throat.   Respiratory:  Positive for cough. Negative for shortness of breath and wheezing.   Gastrointestinal:  Positive for constipation.  Musculoskeletal:  Negative for myalgias.  Neurological:  Negative for headaches.    Observations/Objective: No SOB or distress noted, granddaughter did all the talking given  dementia  Assessment and Plan: 1. Constipation, unspecified constipation type Start Miralax daily as needed Force fluids Encourage high fiber diet - polyethylene glycol powder (GLYCOLAX/MIRALAX) 17 GM/SCOOP powder; Take 17 g by mouth 2 (two) times daily as needed.  Dispense: 3350 g; Refill: 1  2. Viral URI - Take meds as prescribed - Use a cool mist humidifier  -Use saline nose sprays frequently -Force fluids -For any cough or congestion  Use plain Mucinex- regular strength or max strength is fine -For fever or aces or pains- take tylenol or ibuprofen. -Throat lozenges if help Call office if symptoms worsen or do not improve  - fluticasone (FLONASE) 50 MCG/ACT nasal spray; Place 2 sprays into both nostrils daily.  Dispense: 16 g; Refill: 6 - benzonatate (TESSALON) 200 MG capsule; Take 1 capsule (200 mg total) by mouth 3 (three) times daily as needed.  Dispense: 30 capsule; Refill: 1       I discussed the assessment and treatment plan with the patient. The patient was provided an opportunity to ask questions and all were answered. The patient agreed with the plan and demonstrated an understanding of the instructions.   The patient was advised to call back or seek an in-person evaluation if the symptoms worsen or if the condition fails to improve as anticipated.  The above assessment and management plan was discussed with the patient. The patient verbalized understanding of and has agreed to the management plan. Patient is aware to call the clinic if symptoms persist or worsen. Patient is aware when to return to the clinic for a follow-up visit. Patient educated on when it is appropriate to go to the emergency department.   Time call ended:  9:17 AM   I provided 12 minutes of  non face-to-face time during this encounter.    [name] Dun, FNP

## 2021-08-19 NOTE — ED Provider Notes (Signed)
Emergency Department Provider Note   I have reviewed the triage vital signs and the nursing notes.   HISTORY  Chief Complaint Dehydration (Covid + per daughter home test-pt has not been tested at facility)   HPI Kelly Freeman is a 87 y.o. female past history reviewed below including dementia presents to the emergency department with concern for dehydration.  Per EMS, patient is coming from a facility with decreased PO intake today.  Symptoms were apparently recognized by the patient's daughter who performed a home COVID test at the facility which reportedly came back positive.  Patient has no complaints but history is significantly limited by dementia. Level 5 caveat: Dementia.    Past Medical History:  Diagnosis Date   Acute cystitis without hematuria    Acute diarrhea    Allergy    Anxiety    Blood transfusion without reported diagnosis 1949   Cancer ([place])    skin cancer on face   Cataract    bilateral   Chronic allergic rhinitis    CKD (chronic kidney disease) stage 3, GFR 30-59 ml/min (HCC)    Cognitive impairment    DDD (degenerative disc disease), lumbar    Dementia (HCC)    Dizziness    E. coli UTI    Fever    Gastroenteritis    GERD (gastroesophageal reflux disease)    Hereditary and idiopathic peripheral neuropathy    History of recurrent UTIs    Hypertension    Hypertension    MGUS (monoclonal gammopathy of unknown significance)    Migraine without aura and without status migrainosus, not intractable    Mixed incontinence urge and stress    Moderate episode of recurrent major depressive disorder (HCC)    Nausea and vomiting    Obesity    Obstructive sleep apnea    Osteopenia    Recurrent falls    Restless leg syndrome    Sleep apnea    UTI (urinary tract infection)     Patient Active Problem List   Diagnosis Date Noted   Abnormal gait 07/06/2021   Acquired hammer toe of right foot 07/06/2021   Anxiety disorder 07/06/2021   Chronic ulcer of  lower extremity ([place]) 07/06/2021   Chronic ulcer of skin ([place]) 07/06/2021   Hyperlipidemia 07/06/2021   Hypothyroidism 07/06/2021   Insomnia 07/06/2021   Iron deficiency anemia 07/06/2021   Other vitamin B12 deficiency anemias 07/06/2021   Lymphedema 07/06/2021   Major depression, single episode 07/06/2021   Peripheral venous insufficiency 07/06/2021   Urinary incontinence 07/06/2021   Vitamin D deficiency 62/22/9798   Acute metabolic encephalopathy 92/06/9416   Stage 3b chronic kidney disease (CKD) ([place]) 06/06/2021   DDD (degenerative disc disease), lumbar 05/17/2021   Recurrent UTI 02/01/2021   Dementia with behavioral disturbance 01/04/2021   Pain    Palliative care by specialist    Goals of care, counseling/discussion    Hematoma, nontraumatic, soft tissue 12/18/2020   Fall at home, initial encounter 12/18/2020   History of DVT (deep vein thrombosis) 12/18/2020   Acute deep vein thrombosis (DVT) of right lower extremity ([place]) 03/31/2020   Recurrent falls    Moderate episode of recurrent major depressive disorder (HCC)    MGUS (monoclonal gammopathy of unknown significance)    Primary hypertension    History of recurrent UTIs    CKD (chronic kidney disease) stage 3, GFR 30-59 ml/min (HCC)     Past Surgical History:  Procedure Laterality Date   APPENDECTOMY     CHOLECYSTECTOMY  EYE SURGERY     bilateral cataracts removed    Allergies Levofloxacin and Gabapentin  Family History  Problem Relation Age of Onset   Thrombosis Father 16       Coronary   Stomach cancer Mother 33   Arthritis Son 45   Arthritis Daughter 75   Cirrhosis Daughter 71       Stage 3    Hepatitis C Daughter     Social History Social History   Tobacco Use   Smoking status: Never   Smokeless tobacco: Never  Vaping Use   Vaping Use: Never used  Substance Use Topics   Alcohol use: Never   Drug use: Never    Review of Systems  Level 5 caveat: Dementia    ____________________________________________   PHYSICAL EXAM:  VITAL SIGNS: ED Triage Vitals  Enc Vitals Group     BP 08/19/21 2000 (!) 154/70     Pulse Rate 08/19/21 2000 79     Resp 08/19/21 2000 17     Temp 08/19/21 2000 98.9 F (37.2 C)     Temp Source 08/19/21 2000 Oral     SpO2 08/19/21 2000 97 %     Weight 08/19/21 2003 131 lb 13.4 oz (59.8 kg)     Height 08/19/21 2003 5\' 7"  (1.702 m)   Constitutional: Alert. No acute distress.  Eyes: Conjunctivae are normal.  Head: Atraumatic. Nose: No congestion/rhinnorhea. Mouth/Throat: Mucous membranes are moist.  Neck: No stridor.   Cardiovascular: Normal rate, regular rhythm. Good peripheral circulation. Grossly normal heart sounds.   Respiratory: Normal respiratory effort.  No retractions. Lungs CTAB. Gastrointestinal: Soft and nontender. No distention.  Musculoskeletal: No gross deformities of extremities. Neurologic:  No gross focal neurologic deficits are appreciated.  Skin:  Skin is warm, dry and intact. No rash noted.   ____________________________________________   LABS (all labs ordered are listed, but only abnormal results are displayed)  Labs Reviewed  RESP PANEL BY RT-PCR (FLU A&B, COVID) ARPGX2 - Abnormal; Notable for the following components:      Result Value   SARS Coronavirus 2 by RT PCR POSITIVE (*)    All other components within normal limits  COMPREHENSIVE METABOLIC PANEL - Abnormal; Notable for the following components:   BUN 38 (*)    Creatinine, Ser 1.32 (*)    Total Protein 6.1 (*)    Albumin 3.1 (*)    GFR, Estimated 38 (*)    All other components within normal limits  CBC WITH DIFFERENTIAL/PLATELET - Abnormal; Notable for the following components:   Monocytes Absolute 1.1 (*)    All other components within normal limits   ____________________________________________  EKG   EKG Interpretation  Date/Time:  Friday August 19 2021 21:19:01 EST Ventricular Rate:  95 PR  Interval:  164 QRS Duration: 102 QT Interval:  368 QTC Calculation: 461 R Axis:   72 Text Interpretation: Sinus rhythm Multiple premature complexes, vent & supraven Probable left atrial enlargement Low voltage, precordial leads Confirmed by Nanda Quinton [phone]) on 08/19/2021 9:26:45 PM        ____________________________________________  RADIOLOGY  DG Chest Portable 1 View  Result Date: 08/19/2021 CLINICAL DATA:  Weakness, positive COVID-19 test EXAM: PORTABLE CHEST 1 VIEW COMPARISON:  07/15/2021 FINDINGS: Cardiac shadow is mildly enlarged but stable. Aortic calcifications are noted. Lungs are well aerated bilaterally. No focal infiltrate or sizable effusion is seen. Elevation of the right hemidiaphragm is again noted. No acute bony abnormality is seen. IMPRESSION: No active disease. Electronically Signed  By: Inez Catalina M.D.   On: 08/19/2021 20:42    ____________________________________________   PROCEDURES  Procedure(s) performed:   Procedures  None  ____________________________________________   INITIAL IMPRESSION / ASSESSMENT AND PLAN / ED COURSE  Pertinent labs & imaging results that were available during my care of the patient were reviewed by me and considered in my medical decision making (see chart for details).   Patient presents emergency department from a nursing facility with concern for COVID and decreased oral intake.  Daughter is apparently in route to the emergency department to provide additional history.  Patient has no complaints.  She is not hypoxemic.  No increased work of breathing.  Overall, appears well-hydrated.  Plan for IV fluids, labs, chest x-ray, COVID/flu PCR here and reassess.  Patient's lab work here is reassuring.  She is eating and drinking here without difficulty.  Her flu is negative but COVID has come back positive.  Patient is looking well daughter feels that she is at her baseline.  She is not hypoxemic.  No pneumonia on chest x-ray.   Plan for discharge back to her facility but will start Paxlovid.  ____________________________________________  FINAL CLINICAL IMPRESSION(S) / ED DIAGNOSES  Final diagnoses:  COVID-19     MEDICATIONS GIVEN DURING THIS VISIT:  Medications  sodium chloride 0.9 % bolus 500 mL (0 mLs Intravenous Hold 08/19/21 2227)  nirmatrelvir/ritonavir EUA (renal dosing) (PAXLOVID) 2 tablet (has no administration in time range)     Note:  This document was prepared using Dragon voice recognition software and may include unintentional dictation errors.  Nanda Quinton, MD, [facility]    Titania Gault, [facility], MD 08/19/21 [phone]

## 2021-08-19 NOTE — Addendum Note (Signed)
Addended by: [name] Dun A on: 08/19/2021 03:49 PM   Modules accepted: Orders

## 2021-08-19 NOTE — Telephone Encounter (Signed)
[facility] patient is with asking if we can order COVID and flu for them to come out. Please advise

## 2021-08-19 NOTE — Telephone Encounter (Signed)
Molnupiravir Prescription sent to pharmacy. COVID positive, rest, force fluids, tylenol as needed, report any worsening symptoms such as increased shortness of breath, swelling, or continued high fevers.

## 2021-08-19 NOTE — Telephone Encounter (Signed)
Please let family know.

## 2021-08-19 NOTE — ED Notes (Signed)
Dr Long at bedside

## 2021-08-19 NOTE — ED Triage Notes (Addendum)
Pt brought in from [facility] in [place] pt resides in dementia unit. Pt alert and oriented to self only. Pt has no complaints. Per EMS, pt was sent out for "dehydration". Pt has DNR form with her. Pt daughter did a home covid test on pt, as the facility does not test for Covid per EMS. Pt 93-100% on room air. All vitals WNL. When asked pt "what was wrong" pt answered, "they left me alone"

## 2021-08-19 NOTE — Telephone Encounter (Signed)
Granddaughter calls back to let Kelly Freeman know that patient is positive for COVID.

## 2021-08-19 NOTE — Telephone Encounter (Signed)
I spoke with the home health agency.  They do not have the swabs to go out and do this.

## 2021-08-19 NOTE — Telephone Encounter (Signed)
Grandaughter aware to get at home covid test she will call us back if +. States she does not thinks she can bring her here in this weather to get her out.

## 2021-09-01 ENCOUNTER — Telehealth: Payer: Self-pay | Admitting: Family Medicine

## 2021-09-01 NOTE — Telephone Encounter (Signed)
Appointment scheduled.

## 2021-09-07 ENCOUNTER — Encounter: Payer: Self-pay | Admitting: Family Medicine

## 2021-09-07 ENCOUNTER — Ambulatory Visit (INDEPENDENT_AMBULATORY_CARE_PROVIDER_SITE_OTHER): Payer: Medicare Other | Admitting: Family Medicine

## 2021-09-07 DIAGNOSIS — D500 Iron deficiency anemia secondary to blood loss (chronic): Secondary | ICD-10-CM | POA: Diagnosis not present

## 2021-09-07 DIAGNOSIS — I10 Essential (primary) hypertension: Secondary | ICD-10-CM | POA: Diagnosis not present

## 2021-09-07 DIAGNOSIS — J302 Other seasonal allergic rhinitis: Secondary | ICD-10-CM | POA: Diagnosis not present

## 2021-09-07 DIAGNOSIS — Z79899 Other long term (current) drug therapy: Secondary | ICD-10-CM

## 2021-09-07 DIAGNOSIS — N390 Urinary tract infection, site not specified: Secondary | ICD-10-CM

## 2021-09-07 DIAGNOSIS — M1990 Unspecified osteoarthritis, unspecified site: Secondary | ICD-10-CM | POA: Diagnosis not present

## 2021-09-07 DIAGNOSIS — T148XXA Other injury of unspecified body region, initial encounter: Secondary | ICD-10-CM

## 2021-09-07 NOTE — Progress Notes (Addendum)
Virtual Visit via Telephone Note  I connected with Kelly Freeman on 09/07/21 at 10:28 AM by telephone and verified that I am speaking with the correct person using two identifiers. Kelly Freeman is currently located at home [facility]) and her daughter and granddaughter are currently with her during this visit. The provider, Loman Brooklyn, FNP is located in their office at time of visit.  I discussed the limitations, risks, security and privacy concerns of performing an evaluation and management service by telephone and the availability of in person appointments. I also discussed with the patient that there may be a patient responsible charge related to this service. The patient expressed understanding and agreed to proceed.  Subjective: PCP: [name], FNP  Chief Complaint  Patient presents with   Medical Management of Chronic Issues   Family is concerned that patient has too many medications that may not be necessary given her age. Patient often refuses to take her medications. They are currently crushed and put in pudding or yogurt and now patient has started distrusting foods and is not eating well.    ROS: Per HPI  Current Outpatient Medications:    acetaminophen (TYLENOL) 325 MG tablet, Take 2 tablets (650 mg total) by mouth every 8 (eight) hours., Disp: 540 tablet, Rfl: 1   amLODipine (NORVASC) 5 MG tablet, Take 1 tablet (5 mg total) by mouth daily., Disp: 30 tablet, Rfl: 1   benzonatate (TESSALON) 200 MG capsule, Take 1 capsule (200 mg total) by mouth 3 (three) times daily as needed., Disp: 30 capsule, Rfl: 1   cephALEXin (KEFLEX) 250 MG capsule, Take 250 mg by mouth daily., Disp: , Rfl:    cetirizine (ZYRTEC ALLERGY) 10 MG tablet, Take 0.5 tablets (5 mg total) by mouth daily., Disp: 30 tablet, Rfl: 5   diclofenac Sodium (VOLTAREN) 1 % GEL, Apply 1 application topically 4 (four) times daily., Disp: , Rfl:    ferrous sulfate 325 (65 FE) MG tablet, Take 325 mg by mouth  every morning., Disp: , Rfl:    fluticasone (FLONASE) 50 MCG/ACT nasal spray, Place 2 sprays into both nostrils daily., Disp: 16 g, Rfl: 6   furosemide (LASIX) 20 MG tablet, Take 1 tablet (20 mg total) by mouth daily., Disp: 30 tablet, Rfl: 0   haloperidol (HALDOL) 2 MG/ML solution, Take 1 mg by mouth 2 (two) times daily as needed for agitation., Disp: , Rfl:    magnesium oxide (MAG-OX) 400 (240 Mg) MG tablet, Take 1 tablet (400 mg total) by mouth daily., Disp: 30 tablet, Rfl: 1   melatonin 5 MG TABS, Take 5 mg by mouth at bedtime., Disp: , Rfl:    polyethylene glycol powder (GLYCOLAX/MIRALAX) 17 GM/SCOOP powder, Take 17 g by mouth 2 (two) times daily as needed., Disp: 3350 g, Rfl: 1   sertraline (ZOLOFT) 20 MG/ML concentrated solution, Take 60 mg by mouth every morning., Disp: , Rfl:    valproic acid (DEPAKENE) 250 MG/5ML solution, Take 125 mg by mouth 3 (three) times daily., Disp: , Rfl:    zinc oxide (BALMEX) 11.3 % CREA cream, Apply 1 application topically 2 (two) times daily., Disp: 453 g, Rfl: 1  Allergies  Allergen Reactions   Levofloxacin Rash   Gabapentin Other (See Comments)    Other reaction(s): Confusion   Past Medical History:  Diagnosis Date   Acute cystitis without hematuria    Acute diarrhea    Allergy    Anxiety    Blood transfusion without reported diagnosis  1949   Cancer ([place])    skin cancer on face   Cataract    bilateral   Chronic allergic rhinitis    CKD (chronic kidney disease) stage 3, GFR 30-59 ml/min (HCC)    Cognitive impairment    DDD (degenerative disc disease), lumbar    Dementia (HCC)    Dizziness    E. coli UTI    Fever    Gastroenteritis    GERD (gastroesophageal reflux disease)    Hereditary and idiopathic peripheral neuropathy    History of recurrent UTIs    Hypertension    Hypertension    MGUS (monoclonal gammopathy of unknown significance)    Migraine without aura and without status migrainosus, not intractable    Mixed incontinence  urge and stress    Moderate episode of recurrent major depressive disorder (HCC)    Nausea and vomiting    Obesity    Obstructive sleep apnea    Osteopenia    Recurrent falls    Restless leg syndrome    Sleep apnea    UTI (urinary tract infection)     Observations/Objective: Unable to assess patient due to dementia. I was speaking with her daughter and granddaughter.  Assessment and Plan: 1. Medication management Reviewed all medications.  2. Primary hypertension Continue amlodipine for now. I have requested recent vital signs from the past month from the facility to determine if patient needs to continue amlodipine.  3. Recurrent UTI D/C Keflex. Discussed resuming if recurrent UTIs become a problem again. Encouraged incontinence care every 2 hours.   4. Anemia due to blood loss D/C ferrous sulfate. H/H back to normal. Patient was anemic when she had a fall with a large hematoma.   5. Hematoma Resolved.  6. Arthritis Changed Tylenol to as needed. - acetaminophen (TYLENOL) 325 MG tablet; Take 2 tablets (650 mg total) by mouth every 6 (six) hours as needed.  Dispense: 540 tablet; Refill: 1  7. Seasonal allergies D/C Flonase. Continue Zyrtec if they find this beneficial.    Follow Up Instructions:  I discussed the assessment and treatment plan with the patient. The patient was provided an opportunity to ask questions and all were answered. The patient agreed with the plan and demonstrated an understanding of the instructions.   The patient was advised to call back or seek an in-person evaluation if the symptoms worsen or if the condition fails to improve as anticipated.  The above assessment and management plan was discussed with the patient. The patient verbalized understanding of and has agreed to the management plan. Patient is aware to call the clinic if symptoms persist or worsen. Patient is aware when to return to the clinic for a follow-up visit. Patient educated on  when it is appropriate to go to the emergency department.   Time call ended: 10:49  I provided 21 minutes of non-face-to-face time during this encounter.  Hendricks Limes, MSN, APRN, FNP-C [facility] 09/07/21

## 2021-09-12 ENCOUNTER — Encounter: Payer: Self-pay | Admitting: Family Medicine

## 2021-09-12 MED ORDER — ACETAMINOPHEN 325 MG PO TABS: 650.0000 mg | ORAL_TABLET | Freq: Four times a day (QID) | ORAL | 1 refills | Status: DC | PRN

## 2021-09-26 ENCOUNTER — Telehealth: Payer: Self-pay | Admitting: Family Medicine

## 2021-09-27 ENCOUNTER — Ambulatory Visit (INDEPENDENT_AMBULATORY_CARE_PROVIDER_SITE_OTHER): Payer: Medicare Other | Admitting: Family

## 2021-09-27 ENCOUNTER — Encounter: Payer: Self-pay | Admitting: Family

## 2021-09-27 DIAGNOSIS — R399 Unspecified symptoms and signs involving the genitourinary system: Secondary | ICD-10-CM | POA: Diagnosis not present

## 2021-09-27 NOTE — Telephone Encounter (Signed)
Please schedule a telephone visit with me today for this.

## 2021-09-27 NOTE — Telephone Encounter (Signed)
[name], please advise, Kelly Freeman patient.  Call came in yesterday so i'm unsure if they've already dropped off urine and it was not ran, there is no order in place.

## 2021-09-27 NOTE — Telephone Encounter (Signed)
Appt made

## 2021-09-27 NOTE — Progress Notes (Signed)
Virtual Visit  Note Due to COVID-19 pandemic this visit was conducted virtually. This visit type was conducted due to national recommendations for restrictions regarding the COVID-19 Pandemic (e.g. social distancing, sheltering in place) in an effort to limit this patient's exposure and mitigate transmission in our community. All issues noted in this document were discussed and addressed.  A physical exam was not performed with this format.  I connected with Kelly Freeman on 09/27/21 at 2:10 pm by telephone and verified that I am speaking with the correct person using two identifiers. Kelly Freeman is currently located at [place] daughter and granddaughter  is currently with her during visit. The provider, [name] Dun, FNP is located in their office at time of visit.  I discussed the limitations, risks, security and privacy concerns of performing an evaluation and management service by telephone and the availability of in person appointments. I also discussed with the patient that there may be a patient responsible charge related to this service. The patient expressed understanding and agreed to proceed.   Kelly Freeman, Kelly Freeman are scheduled for a virtual visit with your provider today.    Just as we do with appointments in the office, we must obtain your consent to participate.  Your consent will be active for this visit and any virtual visit you may have with one of our providers in the next 365 days.    If you have a MyChart account, I can also send a copy of this consent to you electronically.  All virtual visits are billed to your insurance company just like a traditional visit in the office.  As this is a virtual visit, video technology does not allow for your provider to perform a traditional examination.  This may limit your provider's ability to fully assess your condition.  If your provider identifies any concerns that need to be evaluated in person or the need to arrange testing such as  labs, EKG, etc, we will make arrangements to do so.    Although advances in technology are sophisticated, we cannot ensure that it will always work on either your end or our end.  If the connection with a video visit is poor, we may have to switch to a telephone visit.  With either a video or telephone visit, we are not always able to ensure that we have a secure connection.   I need to obtain your verbal consent now.   Are you willing to proceed with your visit today?   Kelly Freeman has provided verbal consent on 09/27/2021 for a virtual visit (video or telephone).   [name] Dun, [place] 09/27/2021  2:15 PM    History and Present Illness:  Dysuria  This is a new problem. The current episode started yesterday. The problem occurs intermittently. The problem has been waxing and waning. The quality of the pain is described as burning. The pain is mild. Associated symptoms include frequency and urgency. Pertinent negatives include no flank pain, hematuria, nausea or vomiting. Associated symptoms comments: Darker urine, incontinence .     Review of Systems  Gastrointestinal:  Negative for nausea and vomiting.  Genitourinary:  Positive for dysuria, frequency and urgency. Negative for flank pain and hematuria.  All other systems reviewed and are negative.   Observations/Objective: PT confused, daughter did most of talking  Assessment and Plan: 1. UTI symptoms Force fluids RTO if symptoms worsen or do not improve Culture pending - Urinalysis, Complete; Future - Urine Culture; Future     I  discussed the assessment and treatment plan with the patient. The patient was provided an opportunity to ask questions and all were answered. The patient agreed with the plan and demonstrated an understanding of the instructions.   The patient was advised to call back or seek an in-person evaluation if the symptoms worsen or if the condition fails to improve as anticipated.  The above assessment and  management plan was discussed with the patient. The patient verbalized understanding of and has agreed to the management plan. Patient is aware to call the clinic if symptoms persist or worsen. Patient is aware when to return to the clinic for a follow-up visit. Patient educated on when it is appropriate to go to the emergency department.   Time call ended:  2:23 pm   I provided 13 minutes of  non face-to-face time during this encounter.    [name] Dun, FNP

## 2021-09-28 ENCOUNTER — Other Ambulatory Visit: Payer: Medicare Other

## 2021-09-28 DIAGNOSIS — R399 Unspecified symptoms and signs involving the genitourinary system: Secondary | ICD-10-CM

## 2021-09-28 LAB — MICROSCOPIC EXAMINATION: Renal Epithel, UA: NONE SEEN /hpf

## 2021-09-28 LAB — URINALYSIS, COMPLETE
Bilirubin, UA: NEGATIVE
Glucose, UA: NEGATIVE
Nitrite, UA: POSITIVE — AB
Protein,UA: NEGATIVE
RBC, UA: NEGATIVE
Specific Gravity, UA: 1.025 (ref 1.005–1.030)
Urobilinogen, Ur: 0.2 mg/dL (ref 0.2–1.0)
pH, UA: 5.5 (ref 5.0–7.5)

## 2021-09-29 ENCOUNTER — Telehealth: Payer: Self-pay | Admitting: Family Medicine

## 2021-09-29 ENCOUNTER — Other Ambulatory Visit: Payer: Self-pay | Admitting: Family Medicine

## 2021-09-29 MED ORDER — SULFAMETHOXAZOLE-TRIMETHOPRIM 800-160 MG PO TABS: 1.0000 | ORAL_TABLET | Freq: Two times a day (BID) | ORAL | 0 refills | Status: DC

## 2021-09-29 NOTE — Telephone Encounter (Signed)
Patient aware and verbalized understanding. °

## 2021-09-29 NOTE — Telephone Encounter (Signed)
Please advise urine in [facility] box has been out

## 2021-09-29 NOTE — Telephone Encounter (Signed)
Has infection. I sent in med.

## 2021-10-01 LAB — URINE CULTURE

## 2021-10-21 ENCOUNTER — Encounter: Payer: Self-pay | Admitting: Nurse Practitioner

## 2021-10-21 ENCOUNTER — Ambulatory Visit (INDEPENDENT_AMBULATORY_CARE_PROVIDER_SITE_OTHER): Payer: Medicare Other | Admitting: Nurse Practitioner

## 2021-10-21 VITALS — BP 113/69 | HR 66 | Temp 98.9°F | Wt 131.0 lb

## 2021-10-21 DIAGNOSIS — S81811A Laceration without foreign body, right lower leg, initial encounter: Secondary | ICD-10-CM | POA: Diagnosis not present

## 2021-10-21 DIAGNOSIS — R300 Dysuria: Secondary | ICD-10-CM

## 2021-10-21 LAB — MICROSCOPIC EXAMINATION
Bacteria, UA: NONE SEEN
RBC, Urine: NONE SEEN /hpf (ref 0–2)
Renal Epithel, UA: NONE SEEN /hpf

## 2021-10-21 LAB — URINALYSIS, ROUTINE W REFLEX MICROSCOPIC
Bilirubin, UA: NEGATIVE
Glucose, UA: NEGATIVE
Leukocytes,UA: NEGATIVE
Nitrite, UA: NEGATIVE
RBC, UA: NEGATIVE
Specific Gravity, UA: 1.02 (ref 1.005–1.030)
Urobilinogen, Ur: 1 mg/dL (ref 0.2–1.0)
pH, UA: 5 (ref 5.0–7.5)

## 2021-10-21 NOTE — Progress Notes (Signed)
Acute Office Visit  Subjective:    Patient ID: Kelly Freeman, female    DOB: 11-14-1926, 88 y.o.   MRN: [id number]  Chief Complaint  Patient presents with   Wound Check   Dysuria    Dysuria  This is a recurrent problem. The current episode started yesterday. The problem occurs intermittently. The problem has been unchanged. The quality of the pain is described as aching. The pain is mild. There has been no fever. Pertinent negatives include no chills or flank pain. She has tried nothing for the symptoms.    Skin tear Patient is in today for The wound is cleansed, debrided of foreign material as much as possible, and dressed. The patient is alerted to watch for any signs of infection (redness, pus, pain, increased swelling or fever) and call if such occurs. Home wound care instructions are provided. Tetanus vaccination status reviewed: last tetanus booster within 10 years.     Past Medical History:  Diagnosis Date   Acute cystitis without hematuria    Acute diarrhea    Allergy    Anxiety    Blood transfusion without reported diagnosis 1949   Cancer ([place])    skin cancer on face   Cataract    bilateral   Chronic allergic rhinitis    CKD (chronic kidney disease) stage 3, GFR 30-59 ml/min (HCC)    Cognitive impairment    DDD (degenerative disc disease), lumbar    Dementia (HCC)    Dizziness    E. coli UTI    Fever    Gastroenteritis    GERD (gastroesophageal reflux disease)    Hereditary and idiopathic peripheral neuropathy    History of recurrent UTIs    Hypertension    Hypertension    MGUS (monoclonal gammopathy of unknown significance)    Migraine without aura and without status migrainosus, not intractable    Mixed incontinence urge and stress    Moderate episode of recurrent major depressive disorder (HCC)    Nausea and vomiting    Obesity    Obstructive sleep apnea    Osteopenia    Recurrent falls    Restless leg syndrome    Sleep apnea    UTI (urinary tract  infection)     Past Surgical History:  Procedure Laterality Date   APPENDECTOMY     CHOLECYSTECTOMY     EYE SURGERY     bilateral cataracts removed    Family History  Problem Relation Age of Onset   Thrombosis Father 85       Coronary   Stomach cancer Mother 51   Arthritis Son 3   Arthritis Daughter 68   Cirrhosis Daughter 45       Stage 3    Hepatitis C Daughter     Social History   Socioeconomic History   Marital status: Widowed    Spouse name: Not on file   Number of children: 3   Years of education: 12   Highest education level: High school graduate  Occupational History   Occupation: retired  Tobacco Use   Smoking status: Never   Smokeless tobacco: Never  Vaping Use   Vaping Use: Never used  Substance and Sexual Activity   Alcohol use: Never   Drug use: Never   Sexual activity: Not Currently  Other Topics Concern   Not on file  Social History Narrative   Lives in a memory care unit at ALF   Her granddaughter, Ebony Hail visits daily for several hours  Social Determinants of Health   Financial Resource Strain: Low Risk    Difficulty of Paying Living Expenses: Not hard at all  Food Insecurity: No Food Insecurity   Worried About Charity fundraiser in the Last Year: Never true   [place] in the Last Year: Never true  Transportation Needs: No Transportation Needs   Lack of Transportation (Medical): No   Lack of Transportation (Non-Medical): No  Physical Activity: Insufficiently Active   Days of Exercise per Week: 7 days   Minutes of Exercise per Session: 10 min  Stress: No Stress Concern Present   Feeling of Stress : Only a little  Social Connections: Moderately Integrated   Frequency of Communication with Friends and Family: Once a week   Frequency of Social Gatherings with Friends and Family: More than three times a week   Attends Religious Services: 1 to 4 times per year   Active Member of [employer] or Organizations: Yes   Attends Theatre manager Meetings: 1 to 4 times per year   Marital Status: Widowed  Human resources officer Violence: Not At Risk   Fear of Current or Ex-Partner: No   Emotionally Abused: No   Physically Abused: No   Sexually Abused: No    Outpatient Medications Prior to Visit  Medication Sig Dispense Refill   acetaminophen (TYLENOL) 325 MG tablet Take 2 tablets (650 mg total) by mouth every 6 (six) hours as needed. 540 tablet 1   amLODipine (NORVASC) 5 MG tablet Take 1 tablet (5 mg total) by mouth daily. 30 tablet 1   cetirizine (ZYRTEC ALLERGY) 10 MG tablet Take 0.5 tablets (5 mg total) by mouth daily. 30 tablet 5   diclofenac Sodium (VOLTAREN) 1 % GEL Apply 1 application topically 4 (four) times daily.     polyethylene glycol powder (GLYCOLAX/MIRALAX) 17 GM/SCOOP powder Take 17 g by mouth 2 (two) times daily as needed. 3350 g 1   zinc oxide (BALMEX) 11.3 % CREA cream Apply 1 application topically 2 (two) times daily. 453 g 1   sulfamethoxazole-trimethoprim (BACTRIM DS) 800-160 MG tablet Take 1 tablet by mouth 2 (two) times daily. 14 tablet 0   No facility-administered medications prior to visit.    Allergies  Allergen Reactions   Levofloxacin Rash   Gabapentin Other (See Comments)    Other reaction(s): Confusion    Review of Systems  Constitutional:  Negative for chills.  Respiratory: Negative.    Cardiovascular: Negative.   Gastrointestinal: Negative.   Genitourinary:  Positive for dysuria. Negative for flank pain.  Skin:  Positive for wound.  All other systems reviewed and are negative.     Objective:    Physical Exam Vitals and nursing note reviewed.  Constitutional:      Appearance: Normal appearance. She is normal weight.  HENT:     Head: Normocephalic.     Right Ear: External ear normal.     Left Ear: External ear normal.  Eyes:     Conjunctiva/sclera: Conjunctivae normal.  Cardiovascular:     Rate and Rhythm: Normal rate and regular rhythm.     Pulses: Normal pulses.      Heart sounds: Normal heart sounds.  Pulmonary:     Effort: Pulmonary effort is normal.     Breath sounds: Normal breath sounds.  Abdominal:     General: Bowel sounds are normal.  Skin:    General: Skin is warm.     Findings: Erythema and wound present.  Comments: Skin tear measure 4 inch by 0.5 inches wide  Neurological:     Mental Status: She is alert and oriented to person, place, and time.    BP 113/69    Pulse 66    Temp 98.9 F (37.2 C)    Wt 131 lb (59.4 kg) Comment: previous weight (2 months ago) unable to stand on scale   SpO2 97%    BMI 20.52 kg/m  Wt Readings from Last 3 Encounters:  10/21/21 131 lb (59.4 kg)  08/19/21 131 lb 13.4 oz (59.8 kg)  07/15/21 131 lb 13.4 oz (59.8 kg)    Health Maintenance Due  Topic Date Due   URINE MICROALBUMIN  Never done   COVID-19 Vaccine (4 - Booster for Moderna series) 10/29/2020    There are no preventive care reminders to display for this patient.   Lab Results  Component Value Date   TSH 1.705 06/06/2021   Lab Results  Component Value Date   WBC 9.0 08/19/2021   HGB 12.1 08/19/2021   HCT 38.2 08/19/2021   MCV 96.2 08/19/2021   PLT 197 08/19/2021   Lab Results  Component Value Date   NA 141 08/19/2021   K 4.2 08/19/2021   CO2 28 08/19/2021   GLUCOSE 90 08/19/2021   BUN 38 (H) 08/19/2021   CREATININE 1.32 (H) 08/19/2021   BILITOT 0.9 08/19/2021   ALKPHOS 56 08/19/2021   AST 41 08/19/2021   ALT 18 08/19/2021   PROT 6.1 (L) 08/19/2021   ALBUMIN 3.1 (L) 08/19/2021   CALCIUM 9.6 08/19/2021   ANIONGAP 8 08/19/2021   EGFR 42 (L) 03/02/2021   No results found for: CHOL No results found for: HDL No results found for: LDLCALC No results found for: TRIG No results found for: CHOLHDL Lab Results  Component Value Date   HGBA1C 5.0 09/22/2019       Assessment & Plan:  Cleaned and redressed wound/skin tear.  Orders placed for home health nurse to redress wound every 2 to 3 days and patient follow-up in  1 week.  For dysuria completed urinalysis results pending, increase hydration, Tylenol for aches and pain.   Problem List Items Addressed This Visit   None Visit Diagnoses     Dysuria    -  Primary   Relevant Orders   Urinalysis, Routine w reflex microscopic   CULTURE, URINE COMPREHENSIVE   Skin tear of right lower leg without complication, initial encounter       Relevant Orders   Ambulatory referral to [place]        No orders of the defined types were placed in this encounter.    Ivy Lynn, NP

## 2021-10-21 NOTE — Addendum Note (Signed)
Addended by: Ivy Lynn on: 10/21/2021 02:41 PM   Modules accepted: Orders

## 2021-10-21 NOTE — Patient Instructions (Signed)
Wound Care, Adult ?Taking care of your wound properly can help to prevent pain, infection, and scarring. It can also help your wound heal more quickly. Follow instructions from your health care provider about how to care for your wound. ?Supplies needed: ?Soap and water. ?Wound cleanser, saline, or germ-free (sterile) water. ?Gauze. ?If needed, a clean bandage (dressing) or other type of wound dressing material to cover or place in the wound. Follow your health care provider's instructions about what dressing supplies to use. ?Cream or topical ointment to apply to the wound, if told by your health care provider. ?How to care for your wound ?Cleaning the wound ?Ask your health care provider how to clean the wound. This may include: ?Using mild soap and water, a wound cleanser, saline, or sterile water. ?Using a clean gauze to pat the wound dry after cleaning it. Do not rub or scrub the wound. ?Dressing care ?Wash your hands with soap and water for at least 20 seconds before and after you change the dressing. If soap and water are not available, use hand sanitizer. ?Change your dressing as told by your health care provider. This may include: ?Cleaning or rinsing out (irrigating) the wound. ?Application of cream or topical ointment, if told by your health care provider. ?Placing a dressing over the wound or in the wound (packing). ?Covering the wound with an outer dressing. ?Leave stitches (sutures), staples, skin glue, or adhesive strips in place. These skin closures may need to stay in place for 2 weeks or longer. If adhesive strip edges start to loosen and curl up, you may trim the loose edges. Do not remove adhesive strips completely unless your health care provider tells you to do that. ?Ask your health care provider when you can leave the wound uncovered. ?Checking for infection ?Check your wound area every day for signs of infection. Check for: ?More redness, swelling, or pain. ?Fluid or blood. ?Warmth. ?Pus or  a bad smell. ? ?Follow these instructions at home ?Medicines ?If you were prescribed an antibiotic medicine, cream, or ointment, take or apply it as told by your health care provider. Do not stop using the antibiotic even if your condition improves. ?If you were prescribed pain medicine, take it 30 minutes before you do any wound care or as told by your health care provider. ?Take over-the-counter and prescription medicines only as told by your health care provider. ?Eating and drinking ?Eat a diet that includes protein, vitamin A, vitamin C, and other nutrient-rich foods to help the wound heal. ?Foods rich in protein include meat, fish, eggs, dairy, beans, and nuts. ?Foods rich in vitamin A include carrots and dark green, leafy vegetables. ?Foods rich in vitamin C include citrus fruits, tomatoes, broccoli, and peppers. ?Drink enough fluid to keep your urine pale yellow. ?General instructions ?Do not take baths, swim, or use a hot tub until your health care provider approves. Ask your health care provider if you may take showers. You may only be allowed to take sponge baths. ?Do not scratch or pick at the wound. Keep it covered as told by your health care provider. ?Return to your normal activities as told by your health care provider. Ask your health care provider what activities are safe for you. ?Protect your wound from the sun when you are outside for the first 6 months, or for as long as told by your health care provider. Cover up the scar area or apply sunscreen that has an SPF of at least 30. ?Do not   use any products that contain nicotine or tobacco. These products include cigarettes, chewing tobacco, and vaping devices, such as e-cigarettes. If you need help quitting, ask your health care provider. ?Keep all follow-up visits. This is important. ?Contact a health care provider if: ?You received a tetanus shot and you have swelling, severe pain, redness, or bleeding at the injection site. ?Your pain is not  controlled with medicine. ?You have any of these signs of infection: ?More redness, swelling, or pain around the wound. ?Fluid or blood coming from the wound. ?Warmth coming from the wound. ?A fever or chills. ?You are nauseous or you vomit. ?You are dizzy. ?You have a new rash or hardness around the wound. ?Get help right away if: ?You have a red streak of skin near the area around your wound. ?Pus or a bad smell coming from the wound. ?Your wound has been closed with staples, sutures, skin glue, or adhesive strips and it begins to open up and separate. ?Your wound is bleeding, and the bleeding does not stop with gentle pressure. ?These symptoms may represent a serious problem that is an emergency. Do not wait to see if the symptoms will go away. Get medical help right away. Call your local emergency services (911 in the [place] Do not drive yourself to the hospital. ?Summary ?Always wash your hands with soap and water for at least 20 seconds before and after changing your dressing. ?Change your dressing as told by your health care provider. ?To help with healing, eat foods that are rich in protein, vitamin A, vitamin C, and other nutrients. ?Check your wound every day for signs of infection. Contact your health care provider if you think that your wound is infected. ?This information is not intended to replace advice given to you by your health care provider. Make sure you discuss any questions you have with your health care provider. ?Document Revised: 12/21/2020 Document Reviewed: 12/21/2020 ?Elsevier Patient Education ? 2022 Elsevier Inc. ? ?

## 2021-10-29 LAB — CULTURE, URINE COMPREHENSIVE

## 2021-10-30 ENCOUNTER — Other Ambulatory Visit: Payer: Self-pay | Admitting: Nurse Practitioner

## 2021-10-30 MED ORDER — NITROFURANTOIN MONOHYD MACRO 100 MG PO CAPS: 100.0000 mg | ORAL_CAPSULE | Freq: Two times a day (BID) | ORAL | 0 refills | Status: DC

## 2021-11-03 ENCOUNTER — Telehealth: Payer: Self-pay | Admitting: Family Medicine

## 2021-11-03 NOTE — Telephone Encounter (Signed)
Patient seen Je please advise °

## 2021-11-03 NOTE — Telephone Encounter (Signed)
I just saw this after hours, Je to address tomorrow. ?

## 2021-11-03 NOTE — Telephone Encounter (Signed)
Patient was seen for a UTI and was given a medicine for it. She is unable to take and daughter would like to know if a liquid would be possible for the medicine or something similar. Please call back and advise.  ?

## 2021-11-04 ENCOUNTER — Other Ambulatory Visit: Payer: Self-pay | Admitting: Nurse Practitioner

## 2021-11-04 DIAGNOSIS — R399 Unspecified symptoms and signs involving the genitourinary system: Secondary | ICD-10-CM

## 2021-11-04 MED ORDER — NITROFURANTOIN 25 MG/5ML PO SUSP: 100.0000 mg | Freq: Two times a day (BID) | ORAL | 0 refills | Status: DC

## 2021-11-04 NOTE — Telephone Encounter (Signed)
I was out of the office and returned today, I discontinued the pills and sent in liquid for the remaining 5 days. Please watch for worsening signs and symptoms of sepsis with UTI in older adults.  ?

## 2021-11-07 NOTE — Telephone Encounter (Signed)
Daughter aware.

## 2021-12-20 ENCOUNTER — Telehealth: Payer: Self-pay | Admitting: Family Medicine

## 2021-12-20 DIAGNOSIS — R300 Dysuria: Secondary | ICD-10-CM

## 2021-12-20 NOTE — Telephone Encounter (Signed)
Spoke with [facility] stated would collect urine sample - order faxed to them [place] ?

## 2021-12-20 NOTE — Telephone Encounter (Signed)
Pts daughter called stating that she believes and the nurses at [facility] believe that pt has a very bad UTI. Daughter says the problem is, the last time she had a UTI, we prescribed her some medicine, and pt refused to take them.  ? ?Daughter wants to know if we can send order to [facility] for them to collect a urine sample for pt and if urine result shows that pt does have a UTI, can order be given for someone to give her a shot of an antibiotic? ? ?Please advise and call daughter.  ?[phone] ?

## 2021-12-22 ENCOUNTER — Other Ambulatory Visit: Payer: Medicare Other

## 2021-12-22 ENCOUNTER — Telehealth: Payer: Self-pay | Admitting: Family Medicine

## 2021-12-22 ENCOUNTER — Other Ambulatory Visit: Payer: Self-pay | Admitting: Family Medicine

## 2021-12-22 DIAGNOSIS — N3001 Acute cystitis with hematuria: Secondary | ICD-10-CM

## 2021-12-22 DIAGNOSIS — R300 Dysuria: Secondary | ICD-10-CM

## 2021-12-22 LAB — MICROSCOPIC EXAMINATION
Epithelial Cells (non renal): NONE SEEN /hpf (ref 0–10)
RBC, Urine: NONE SEEN /hpf (ref 0–2)
Renal Epithel, UA: NONE SEEN /hpf

## 2021-12-22 LAB — URINALYSIS, COMPLETE
Bilirubin, UA: NEGATIVE
Glucose, UA: NEGATIVE
Ketones, UA: NEGATIVE
Nitrite, UA: POSITIVE — AB
Protein,UA: NEGATIVE
RBC, UA: NEGATIVE
Specific Gravity, UA: 1.015 (ref 1.005–1.030)
Urobilinogen, Ur: 0.2 mg/dL (ref 0.2–1.0)
pH, UA: 7 (ref 5.0–7.5)

## 2021-12-22 MED ORDER — SULFAMETHOXAZOLE-TRIMETHOPRIM 800-160 MG PO TABS: 1.0000 | ORAL_TABLET | Freq: Two times a day (BID) | ORAL | 0 refills | Status: AC

## 2021-12-22 NOTE — Progress Notes (Signed)
DAUGHTER REPORTS PATIENT IN MEMORY CARE UNIT AND DOES NOT TAKE MEDS WELL, IS ASKING IF POSSIBLE FOR THEM TO BRING HER FOR AN ANTIBIOTIC INJECTION INSTEAD ?

## 2021-12-22 NOTE — Telephone Encounter (Signed)
Kelly Gouty, FNP  [facility] ?She can get rocephin 1 gm every 24 hours x 3 doses  ? ? ?Daughter aware but states they will try pills first.  ?

## 2021-12-29 ENCOUNTER — Telehealth: Payer: Self-pay | Admitting: Family Medicine

## 2021-12-29 LAB — URINE CULTURE

## 2021-12-29 NOTE — Telephone Encounter (Signed)
Lmtcb.

## 2021-12-29 NOTE — Telephone Encounter (Signed)
Continue basic wound care for now. Follow up for any signs of infection.  ?

## 2021-12-29 NOTE — Telephone Encounter (Signed)
Residents found patient on the ground, she did not fall but when they when to get her up, the patient fought back and ended up with a skin tear. They have bandaged and applied antibiotic ointment to the tear but wanted to know if there was anything that we could recommend for them to do to treat. Please call back.  ?

## 2022-01-04 ENCOUNTER — Encounter: Payer: Self-pay | Admitting: Emergency Medicine

## 2022-01-06 NOTE — Telephone Encounter (Signed)
I spoke with [name] (pt's daughter) and she states Kenesha has been taken care of and her wound has healed. ?

## 2022-01-18 ENCOUNTER — Encounter: Payer: Self-pay | Admitting: Family Medicine

## 2022-01-19 ENCOUNTER — Encounter: Payer: Self-pay | Admitting: Family Medicine

## 2022-01-19 ENCOUNTER — Ambulatory Visit (INDEPENDENT_AMBULATORY_CARE_PROVIDER_SITE_OTHER): Payer: Medicare Other | Admitting: Family Medicine

## 2022-01-19 ENCOUNTER — Ambulatory Visit (INDEPENDENT_AMBULATORY_CARE_PROVIDER_SITE_OTHER): Payer: Medicare Other

## 2022-01-19 VITALS — BP 137/85 | HR 71 | Temp 99.0°F | Ht 67.0 in

## 2022-01-19 DIAGNOSIS — F03918 Unspecified dementia, unspecified severity, with other behavioral disturbance: Secondary | ICD-10-CM

## 2022-01-19 DIAGNOSIS — T148XXA Other injury of unspecified body region, initial encounter: Secondary | ICD-10-CM

## 2022-01-19 DIAGNOSIS — R5381 Other malaise: Secondary | ICD-10-CM

## 2022-01-19 DIAGNOSIS — S80821A Blister (nonthermal), right lower leg, initial encounter: Secondary | ICD-10-CM | POA: Diagnosis not present

## 2022-01-19 DIAGNOSIS — W19XXXA Unspecified fall, initial encounter: Secondary | ICD-10-CM

## 2022-01-19 DIAGNOSIS — M79661 Pain in right lower leg: Secondary | ICD-10-CM

## 2022-01-19 DIAGNOSIS — I10 Essential (primary) hypertension: Secondary | ICD-10-CM | POA: Diagnosis not present

## 2022-01-19 NOTE — Patient Instructions (Signed)
Hematoma A hematoma is a collection of blood under the skin, in an organ, in a body space, in a joint space, or in other tissue. The blood can thicken (clot) to form a lump that you can see and feel. The lump is often firm and may become sore and tender. Most hematomas get better in a few days to weeks. However, some hematomas may be serious and require medical care. Hematomas can range from very small to very large. What are the causes? This condition is caused by: A blunt or penetrating injury. A leakage from a blood vessel under the skin. Some medical procedures, including surgeries, such as oral surgery, face lifts, and surgeries on the joints. Some medical conditions that cause bleeding or bruising. There may be multiple hematomas that appear in different areas of the body. What increases the risk? You are more likely to develop this condition if: You are an older adult. You use blood thinners. What are the signs or symptoms?  Symptoms of this condition depend on where the hematoma is located.  Common symptoms of a hematoma that is under the skin include: A firm lump on the body. Pain and tenderness in the area. Bruising. Blue, dark blue, purple-red, or yellowish skin (discoloration) may appear at the site of the hematoma if the hematoma is close to the surface of the skin. Common symptoms of a hematoma that is deep in the tissues or body spaces may be less obvious. They include: A collection of blood in the stomach (intra-abdominal hematoma). This may cause pain in the abdomen, weakness, fainting, and shortness of breath. A collection of blood in the head (intracranial hematoma). This may cause a headache or symptoms such as weakness, trouble speaking or understanding, or a change in consciousness.  How is this diagnosed? This condition is diagnosed based on: Your medical history. A physical exam. Imaging tests, such as an ultrasound or CT scan. These may be needed if your health care  provider suspects a hematoma in deeper tissues or body spaces. Blood tests. These may be needed if your health care provider believes that the hematoma is caused by a medical condition. How is this treated? Treatment for this condition depends on the cause, size, and location of the hematoma. Treatment may include: Doing nothing. The majority of hematomas do not need treatment as many of them go away on their own over time. Surgery or close monitoring. This may be needed for large hematomas or hematomas that affect vital organs. Medicines. Medicines may be given if there is an underlying medical cause for the hematoma. Follow these instructions at home: Managing pain, stiffness, and swelling  If directed, put ice on the affected area. Put ice in a plastic bag. Place a towel between your skin and the bag. Leave the ice on for 20 minutes, 2-3 times a day for the first couple of days. If directed, apply heat to the affected area after applying ice for a couple of days. Use the heat source that your health care provider recommends, such as a moist heat pack or a heating pad. Place a towel between your skin and the heat source. Leave the heat on for 20-30 minutes. Remove the heat if your skin turns bright red. This is especially important if you are unable to feel pain, heat, or cold. You may have a greater risk of getting burned. Raise (elevate) the affected area above the level of your heart while you are sitting or lying down. If told, wrap   the affected area with an elastic bandage. The bandage applies pressure (compression) to the area, which may help to reduce swelling and promote healing. Do not wrap the bandage too tightly around the affected area. If your hematoma is on a leg or foot (lower extremity) and is painful, your health care provider may recommend crutches. Use them as told by your health care provider. General instructions Take over-the-counter and prescription medicines only as  told by your health care provider. Keep all follow-up visits as told by your health care provider. This is important. Contact a health care provider if: You have a fever. The swelling or discoloration gets worse. You develop more hematomas. Get help right away if: Your pain is worse or your pain is not controlled with medicine. Your skin over the hematoma breaks or starts bleeding. Your hematoma is in your chest or abdomen and you have weakness, shortness of breath, or a change in consciousness. You have a hematoma on your scalp that is caused by a fall or injury, and you also have: A headache that gets worse. Trouble speaking or understanding speech. Weakness. Change in alertness or consciousness. Summary A hematoma is a collection of blood under the skin, in an organ, in a body space, in a joint space, or in other tissue. This condition usually does not need treatment because many hematomas go away on their own over time. Large hematomas, or those that may affect vital organs, may need surgical drainage or monitoring. If the hematoma is caused by a medical condition, medicines may be prescribed. Get help right away if your hematoma breaks or starts to bleed, you have shortness of breath, or you have a headache or trouble speaking after a fall. This information is not intended to replace advice given to you by your health care provider. Make sure you discuss any questions you have with your health care provider. Document Revised: 06/09/2021 Document Reviewed: 06/09/2021 Elsevier Patient Education  2023 Elsevier Inc.  

## 2022-01-19 NOTE — Progress Notes (Signed)
   Acute Office Visit  Subjective:     Patient ID: Kelly Freeman, female    DOB: [birth year], 88 y.o.   MRN: [id number]  Chief Complaint  Patient presents with   Fall    HPI Here with daughter Kelly Freeman who provided the history due to Kelly Freeman's dementia. Patient is in today for a blood blister on the back of her lower right leg. This present yesterday. Kelly Freeman is a resident at [facility] and was found by staff on the floor on Tuesday. They were not sure if she fell or not. There was not injury to the lower right leg at that time. There is no skin breakdown present and there has not been any drainage or erythema around it. Her daughter wonders if she managed to catch her leg in the foot of the recliner when trying to get out of it.   Kelly Freeman has been refusing her medications so these have been discontinued. She has also been refusing PT and is not able to walk now due to decreased strength and physical deconditioning.   ROS As per HPI.      Objective:    BP 137/85   Pulse 71   Temp 99 F (37.2 C) (Temporal)   Ht '5\' 7"'$  (1.702 m)   SpO2 95%   BMI 20.52 kg/m  BP Readings from Last 3 Encounters:  01/19/22 137/85  10/21/21 113/69  08/20/21 (!) 161/80      Physical Exam Vitals and nursing note reviewed.  Constitutional:      General: She is not in acute distress.    Appearance: She is not ill-appearing or toxic-appearing.  Cardiovascular:     Rate and Rhythm: Normal rate and regular rhythm.     Heart sounds: Normal heart sounds. No murmur heard. Pulmonary:     Effort: Pulmonary effort is normal. No respiratory distress.     Breath sounds: Normal breath sounds.  Musculoskeletal:        General: Signs of injury (Large hematoma to posterior RLE. No signs of infection. Anterior RLE is tender along bone. No edema present) present.     Right lower leg: No edema.     Left lower leg: No edema.  Skin:    General: Skin is warm and dry.     Findings: No erythema or rash.  Neurological:      Mental Status: She is alert. Mental status is at baseline.     Motor: Weakness (generalized) present.     Gait: Gait abnormal (arrives in wheelchair).    No results found for any visits on 01/19/22.      Assessment & Plan:   Kelly Freeman was seen today for fall.  Diagnoses and all orders for this visit:  Hematoma Fall, initial encounter Pain in right lower leg Xray negative for fracture today. Discussed heat to hematoma, tylenol prn for pain. Discussed can apply dressing if need and if patient will allow. Can apply compression if patient will allow.  -     DG Tibia/Fibula Right; Future  Primary hypertension BP at goal today. No longer on medication.   Dementia with behavioral disturbance [name]) Physical deconditioning Patient has been refusing medications and PT services. This have now been discontinued. Family is agreeable to this.    Return in about 6 months (around 07/22/2022) for chronic follow up.  The patient indicates understanding of these issues and agrees with the plan.   Kelly Perking, FNP

## 2022-01-20 DIAGNOSIS — R5381 Other malaise: Secondary | ICD-10-CM | POA: Insufficient documentation

## 2022-02-14 ENCOUNTER — Telehealth: Payer: Self-pay | Admitting: Family Medicine

## 2022-02-14 NOTE — Telephone Encounter (Signed)
Aware FL2 ppw on providers desk

## 2022-02-15 ENCOUNTER — Ambulatory Visit (INDEPENDENT_AMBULATORY_CARE_PROVIDER_SITE_OTHER): Payer: Medicare Other | Admitting: Nurse Practitioner

## 2022-02-15 ENCOUNTER — Encounter: Payer: Self-pay | Admitting: Nurse Practitioner

## 2022-02-15 DIAGNOSIS — R300 Dysuria: Secondary | ICD-10-CM | POA: Diagnosis not present

## 2022-02-15 NOTE — Patient Instructions (Signed)

## 2022-02-15 NOTE — Progress Notes (Signed)
New Patient Note  RE: Kelly Freeman MRN: [id number] DOB: Jan 16, 1927 Date of Office Visit: 02/15/2022  Chief Complaint: No chief complaint on file.  History of Present Illness:   Virtual Visit  Note Due to COVID-19 pandemic this visit was conducted virtually. This visit type was conducted due to national recommendations for restrictions regarding the COVID-19 Pandemic (e.g. social distancing, sheltering in place) in an effort to limit this patient's exposure and mitigate transmission in our community. All issues noted in this document were discussed and addressed.  A physical exam was not performed with this format.  I connected with Kelly Freeman on 02/15/22 at 4 PM by telephone and verified that I am speaking with the correct person using two identifiers. Kelly Freeman is currently located at facility with caregiver during visit. The provider, Kelly Lynn, NP is located in their office at time of visit.  I discussed the limitations, risks, security and privacy concerns of performing an evaluation and management service by telephone and the availability of in person appointments. I also discussed with the patient that there may be a patient responsible charge related to this service. The patient expressed understanding and agreed to proceed.   History and Present Illness:  Dysuria  This is a new problem. The current episode started in the past 7 days. The problem occurs every urination. The problem has been unchanged. The quality of the pain is described as aching and burning. The pain is moderate. There has been no fever. Associated symptoms include chills and urgency. Pertinent negatives include no flank pain, hematuria or nausea. She has tried nothing for the symptoms. The treatment provided no relief.      Review of Systems  Constitutional:  Positive for chills.  HENT: Negative.    Gastrointestinal:  Negative for nausea.  Genitourinary:  Positive for dysuria and urgency.  Negative for flank pain and hematuria.  Skin: Negative.   All other systems reviewed and are negative.    Observations/Objective: Tele-visit patient is not in distress  Assessment and Plan:  UTI  vs  interstitial cystitis -urinalysis completed results pending -pyridium for pain Precaution and education provided -All questions answered -Increase hydration -follow up with unresolved symptoms   Follow Up Instructions: Follow-up with worsening unresolved symptoms.    I discussed the assessment and treatment plan with the patient. The patient was provided an opportunity to ask questions and all were answered. The patient agreed with the plan and demonstrated an understanding of the instructions.   The patient was advised to call back or seek an in-person evaluation if the symptoms worsen or if the condition fails to improve as anticipated.  The above assessment and management plan was discussed with the patient. The patient verbalized understanding of and has agreed to the management plan. Patient is aware to call the clinic if symptoms persist or worsen. Patient is aware when to return to the clinic for a follow-up visit. Patient educated on when it is appropriate to go to the emergency department.   Time call ended:    I provided 11 minutes of  non face-to-face time during this encounter.    Kelly Lynn, NP    Assessment and Plan: Kelly Freeman is a 88 y.o. female with: No problem-specific Assessment & Plan notes found for this encounter.  No follow-ups on file.   Diagnostics:   Past Medical History: Patient Active Problem List   Diagnosis Date Noted   Physical deconditioning 01/20/2022   Abnormal gait 07/06/2021  Acquired hammer toe of right foot 07/06/2021   Anxiety disorder 07/06/2021   Chronic ulcer of lower extremity ([place]) 07/06/2021   Chronic ulcer of skin ([place]) 07/06/2021   Hyperlipidemia 07/06/2021   Hypothyroidism 07/06/2021   Insomnia 07/06/2021   Iron  deficiency anemia 07/06/2021   Other vitamin B12 deficiency anemias 07/06/2021   Lymphedema 07/06/2021   Major depression, single episode 07/06/2021   Peripheral venous insufficiency 07/06/2021   Urinary incontinence 07/06/2021   Vitamin D deficiency 67/61/9509   Acute metabolic encephalopathy 32/67/1245   Stage 3b chronic kidney disease (CKD) ([place]) 06/06/2021   DDD (degenerative disc disease), lumbar 05/17/2021   Recurrent UTI 02/01/2021   Dementia with behavioral disturbance ([place]) 01/04/2021   Pain    Palliative care by specialist    Goals of care, counseling/discussion    Hematoma, nontraumatic, soft tissue 12/18/2020   Fall at home, initial encounter 12/18/2020   History of DVT (deep vein thrombosis) 12/18/2020   Acute deep vein thrombosis (DVT) of right lower extremity ([place]) 03/31/2020   Recurrent falls    Moderate episode of recurrent major depressive disorder (HCC)    MGUS (monoclonal gammopathy of unknown significance)    Primary hypertension    History of recurrent UTIs    CKD (chronic kidney disease) stage 3, GFR 30-59 ml/min (HCC)    Past Medical History:  Diagnosis Date   Acute cystitis without hematuria    Acute diarrhea    Allergy    Anxiety    Blood transfusion without reported diagnosis 1949   Cancer ([place])    skin cancer on face   Cataract    bilateral   Chronic allergic rhinitis    CKD (chronic kidney disease) stage 3, GFR 30-59 ml/min (HCC)    Cognitive impairment    DDD (degenerative disc disease), lumbar    Dementia (HCC)    Dizziness    E. coli UTI    Fever    Gastroenteritis    GERD (gastroesophageal reflux disease)    Hereditary and idiopathic peripheral neuropathy    History of recurrent UTIs    Hypertension    Hypertension    MGUS (monoclonal gammopathy of unknown significance)    Migraine without aura and without status migrainosus, not intractable    Mixed incontinence urge and stress    Moderate episode of recurrent major depressive  disorder (HCC)    Nausea and vomiting    Obesity    Obstructive sleep apnea    Osteopenia    Recurrent falls    Restless leg syndrome    Sleep apnea    UTI (urinary tract infection)    Past Surgical History: Past Surgical History:  Procedure Laterality Date   APPENDECTOMY     CHOLECYSTECTOMY     EYE SURGERY     bilateral cataracts removed   Medication List:  Current Outpatient Medications  Medication Sig Dispense Refill   polyethylene glycol powder (GLYCOLAX/MIRALAX) 17 GM/SCOOP powder Take 17 g by mouth 2 (two) times daily as needed. 3350 g 1   zinc oxide (BALMEX) 11.3 % CREA cream Apply 1 application topically 2 (two) times daily. 453 g 1   No current facility-administered medications for this visit.   Allergies: Allergies  Allergen Reactions   Levofloxacin Rash   Gabapentin Other (See Comments)    Other reaction(s): Confusion   Social History: Social History   Socioeconomic History   Marital status: Widowed    Spouse name: Not on file   Number of children: 3  Years of education: 34   Highest education level: High school graduate  Occupational History   Occupation: retired  Tobacco Use   Smoking status: Never   Smokeless tobacco: Never  Vaping Use   Vaping Use: Never used  Substance and Sexual Activity   Alcohol use: Never   Drug use: Never   Sexual activity: Not Currently  Other Topics Concern   Not on file  Social History Narrative   Lives in a memory care unit at [facility]   Her granddaughter, Ebony Hail visits daily for several hours      Social Determinants of Health   Financial Resource Strain: Low Risk  (07/06/2021)   Overall Financial Resource Strain (CARDIA)    Difficulty of Paying Living Expenses: Not hard at all  Food Insecurity: No Food Insecurity (07/06/2021)   Hunger Vital Sign    Worried About Running Out of Food in the Last Year: Never true    [place] in the Last Year: Never true  Transportation Needs: No Transportation Needs  (07/06/2021)   PRAPARE - Hydrologist (Medical): No    Lack of Transportation (Non-Medical): No  Physical Activity: Insufficiently Active (07/06/2021)   Exercise Vital Sign    Days of Exercise per Week: 7 days    Minutes of Exercise per Session: 10 min  Stress: No Stress Concern Present (07/06/2021)   [place]    Feeling of Stress : Only a little  Social Connections: Moderately Integrated (07/06/2021)   Social Connection and Isolation Panel [NHANES]    Frequency of Communication with Friends and Family: Once a week    Frequency of Social Gatherings with Friends and Family: More than three times a week    Attends Religious Services: 1 to 4 times per year    Active Member of [employer] or Organizations: Yes    Attends Archivist Meetings: 1 to 4 times per year    Marital Status: Widowed       Family History: Family History  Problem Relation Age of Onset   Thrombosis Father 75       Coronary   Stomach cancer Mother 8   Arthritis Son 89   Arthritis Daughter 25   Cirrhosis Daughter 55       Stage 3    Hepatitis C Daughter           Objective: There were no vitals taken for this visit. There is no height or weight on file to calculate BMI.  The plan was reviewed with the patient/family, and all questions/concerned were addressed.  It was my pleasure to see Kandie today and participate in her care. Please feel free to contact me with any questions or concerns.  Sincerely,  Jac Canavan NP [place]

## 2022-02-21 ENCOUNTER — Ambulatory Visit: Payer: Medicare Other

## 2022-02-24 ENCOUNTER — Ambulatory Visit: Payer: Medicare Other | Admitting: Family Medicine

## 2022-02-27 ENCOUNTER — Ambulatory Visit (INDEPENDENT_AMBULATORY_CARE_PROVIDER_SITE_OTHER): Payer: Medicare Other | Admitting: Nurse Practitioner

## 2022-02-27 ENCOUNTER — Encounter: Payer: Self-pay | Admitting: Nurse Practitioner

## 2022-02-27 ENCOUNTER — Ambulatory Visit: Payer: Medicare Other

## 2022-02-27 VITALS — BP 132/76 | HR 70 | Temp 97.5°F

## 2022-02-27 DIAGNOSIS — M5136 Other intervertebral disc degeneration, lumbar region: Secondary | ICD-10-CM

## 2022-02-27 DIAGNOSIS — Z029 Encounter for administrative examinations, unspecified: Secondary | ICD-10-CM | POA: Diagnosis not present

## 2022-02-27 DIAGNOSIS — M51369 Other intervertebral disc degeneration, lumbar region without mention of lumbar back pain or lower extremity pain: Secondary | ICD-10-CM

## 2022-02-27 DIAGNOSIS — Z111 Encounter for screening for respiratory tuberculosis: Secondary | ICD-10-CM | POA: Diagnosis not present

## 2022-02-27 NOTE — Assessment & Plan Note (Signed)
Completed head to toe assessment, patient is moving into a new facility, Kelly Freeman , completed PPD skin test, DME orders and physical therapy.

## 2022-02-27 NOTE — Assessment & Plan Note (Signed)
Patient symptoms are worse and is needing a wheel chair to move around, patient is not longer able to stand or walk without a walker. Patient will be needing physical therapy to help with strengthening exercise.  Completed PT orders, completed DME order for wheel chair.

## 2022-02-27 NOTE — Progress Notes (Signed)
Established Patient Office Visit  Subjective   Patient ID: Kelly Freeman, female    DOB: 10/15/1926  Age: 88 y.o. MRN: [id number]  No chief complaint on file.   HPI  Patient Active Problem List   Diagnosis Date Noted   Administrative encounter 02/27/2022   Physical deconditioning 01/20/2022   Abnormal gait 07/06/2021   Acquired hammer toe of right foot 07/06/2021   Anxiety disorder 07/06/2021   Chronic ulcer of lower extremity ([place]) 07/06/2021   Chronic ulcer of skin ([place]) 07/06/2021   Hyperlipidemia 07/06/2021   Hypothyroidism 07/06/2021   Insomnia 07/06/2021   Iron deficiency anemia 07/06/2021   Other vitamin B12 deficiency anemias 07/06/2021   Lymphedema 07/06/2021   Major depression, single episode 07/06/2021   Peripheral venous insufficiency 07/06/2021   Urinary incontinence 07/06/2021   Vitamin D deficiency 35/32/9924   Acute metabolic encephalopathy 26/83/4196   Stage 3b chronic kidney disease (CKD) ([place]) 06/06/2021   DDD (degenerative disc disease), lumbar 05/17/2021   Recurrent UTI 02/01/2021   Dementia with behavioral disturbance ([place]) 01/04/2021   Pain    Palliative care by specialist    Goals of care, counseling/discussion    Hematoma, nontraumatic, soft tissue 12/18/2020   Fall at home, initial encounter 12/18/2020   History of DVT (deep vein thrombosis) 12/18/2020   Acute deep vein thrombosis (DVT) of right lower extremity ([place]) 03/31/2020   Recurrent falls    Moderate episode of recurrent major depressive disorder (HCC)    MGUS (monoclonal gammopathy of unknown significance)    Primary hypertension    History of recurrent UTIs    CKD (chronic kidney disease) stage 3, GFR 30-59 ml/min (HCC)    Past Medical History:  Diagnosis Date   Acute cystitis without hematuria    Acute diarrhea    Allergy    Anxiety    Blood transfusion without reported diagnosis 1949   Cancer ([place])    skin cancer on face   Cataract    bilateral   Chronic allergic  rhinitis    CKD (chronic kidney disease) stage 3, GFR 30-59 ml/min (HCC)    Cognitive impairment    DDD (degenerative disc disease), lumbar    Dementia (HCC)    Dizziness    E. coli UTI    Fever    Gastroenteritis    GERD (gastroesophageal reflux disease)    Hereditary and idiopathic peripheral neuropathy    History of recurrent UTIs    Hypertension    Hypertension    MGUS (monoclonal gammopathy of unknown significance)    Migraine without aura and without status migrainosus, not intractable    Mixed incontinence urge and stress    Moderate episode of recurrent major depressive disorder (HCC)    Nausea and vomiting    Obesity    Obstructive sleep apnea    Osteopenia    Recurrent falls    Restless leg syndrome    Sleep apnea    UTI (urinary tract infection)    Past Surgical History:  Procedure Laterality Date   APPENDECTOMY     CHOLECYSTECTOMY     EYE SURGERY     bilateral cataracts removed   Social History   Tobacco Use   Smoking status: Never   Smokeless tobacco: Never  Vaping Use   Vaping Use: Never used  Substance Use Topics   Alcohol use: Never   Drug use: Never   Social History   Socioeconomic History   Marital status: Widowed    Spouse name: Not on  file   Number of children: 3   Years of education: 12   Highest education level: High school graduate  Occupational History   Occupation: retired  Tobacco Use   Smoking status: Never   Smokeless tobacco: Never  Vaping Use   Vaping Use: Never used  Substance and Sexual Activity   Alcohol use: Never   Drug use: Never   Sexual activity: Not Currently  Other Topics Concern   Not on file  Social History Narrative   Lives in a memory care unit at [facility]   Her granddaughter, Ebony Hail visits daily for several hours      Social Determinants of Health   Financial Resource Strain: Low Risk  (07/06/2021)   Overall Financial Resource Strain (CARDIA)    Difficulty of Paying Living Expenses: Not hard at all   Food Insecurity: No Food Insecurity (07/06/2021)   Hunger Vital Sign    Worried About Running Out of Food in the Last Year: Never true    [place] in the Last Year: Never true  Transportation Needs: No Transportation Needs (07/06/2021)   PRAPARE - Hydrologist (Medical): No    Lack of Transportation (Non-Medical): No  Physical Activity: Insufficiently Active (07/06/2021)   Exercise Vital Sign    Days of Exercise per Week: 7 days    Minutes of Exercise per Session: 10 min  Stress: No Stress Concern Present (07/06/2021)   [place]    Feeling of Stress : Only a little  Social Connections: Moderately Integrated (07/06/2021)   Social Connection and Isolation Panel [NHANES]    Frequency of Communication with Friends and Family: Once a week    Frequency of Social Gatherings with Friends and Family: More than three times a week    Attends Religious Services: 1 to 4 times per year    Active Member of [employer] or Organizations: Yes    Attends Archivist Meetings: 1 to 4 times per year    Marital Status: Widowed  Intimate Partner Violence: Not At Risk (07/06/2021)   Humiliation, Afraid, Rape, and Kick questionnaire    Fear of Current or Ex-Partner: No    Emotionally Abused: No    Physically Abused: No    Sexually Abused: No   Family Status  Relation Name Status   Father Jacklynn Lewis Deceased   Mother Dorita Fray Deceased   Brother [place] Deceased   Sister Ethel Deceased   Brother John Deceased   Brother Healy Deceased   Sister [place] Deceased   Sister [place] Deceased   Sister Oleta Mouse Deceased   Sister [place]on [place] Alive   Son [place] Alive   Daughter [place] Alive   Daughter Collie Siad Alive   Family History  Problem Relation Age of Onset   Thrombosis Father 37       Coronary   Stomach cancer Mother 56   Arthritis Son 29   Arthritis Daughter 54   Cirrhosis Daughter 26       Stage 3     Hepatitis C Daughter    Allergies  Allergen Reactions   Levofloxacin Rash   Gabapentin Other (See Comments)    Other reaction(s): Confusion      Review of Systems  Constitutional: Negative.   HENT: Negative.    Respiratory: Negative.    Cardiovascular: Negative.   Genitourinary: Negative.   Musculoskeletal: Negative.   Skin: Negative.  Negative for rash.  All other systems reviewed  and are negative.     Objective:     BP 132/76   Pulse 70   Temp (!) 97.5 F (36.4 C)   SpO2 91%     Physical Exam Vitals and nursing note reviewed.  Constitutional:      Appearance: Normal appearance.  HENT:     Head: Normocephalic.     Nose: Nose normal.     Mouth/Throat:     Mouth: Mucous membranes are moist.     Pharynx: Oropharynx is clear.  Eyes:     Conjunctiva/sclera: Conjunctivae normal.  Cardiovascular:     Rate and Rhythm: Normal rate and regular rhythm.  Pulmonary:     Effort: Pulmonary effort is normal.     Breath sounds: Normal breath sounds.  Abdominal:     General: Bowel sounds are normal.  Musculoskeletal:        General: Normal range of motion.     Lumbar back: Tenderness present. Normal range of motion.     Comments: Patient is unable to walk or stand without assistance  Skin:    General: Skin is dry.     Coloration: Skin is pale.  Neurological:     General: No focal deficit present.     Mental Status: She is alert and oriented to person, place, and time.  Psychiatric:     Comments: History of dementia with behavioral effect      No results found for any visits on 02/27/22.     The ASCVD Risk score (Arnett DK, et al., 2019) failed to calculate for the following reasons:   The 2019 ASCVD risk score is only valid for ages 54 to 40    Assessment & Plan:   Problem List Items Addressed This Visit       Musculoskeletal and Integument   DDD (degenerative disc disease), lumbar    Patient symptoms are worse and is needing a wheel chair to move  around, patient is not longer able to stand or walk without a walker. Patient will be needing physical therapy to help with strengthening exercise.  Completed PT orders, completed DME order for wheel chair.       Relevant Orders   For home use only DME Other see comment   Ambulatory referral to Physical Therapy     Other   Administrative encounter    Completed head to toe assessment, patient is moving into a new facility, [facility] , completed PPD skin test, DME orders and physical therapy.       Other Visit Diagnoses     PPD screening test    -  Primary   Relevant Orders   PPD (Completed)       Return if symptoms worsen or fail to improve.    Ivy Lynn, NP

## 2022-03-01 ENCOUNTER — Ambulatory Visit: Payer: Medicare Other

## 2022-03-01 DIAGNOSIS — Z111 Encounter for screening for respiratory tuberculosis: Secondary | ICD-10-CM

## 2022-03-01 NOTE — Progress Notes (Signed)
Patient here today to have TB skin test that was placed on 02/27/22 read.  Results are negative and letter was printed for caregiver.

## 2022-03-08 ENCOUNTER — Other Ambulatory Visit: Payer: Self-pay | Admitting: Nurse Practitioner

## 2022-03-08 DIAGNOSIS — R296 Repeated falls: Secondary | ICD-10-CM

## 2022-03-08 DIAGNOSIS — M5136 Other intervertebral disc degeneration, lumbar region: Secondary | ICD-10-CM

## 2022-03-09 ENCOUNTER — Telehealth: Payer: Self-pay | Admitting: *Deleted

## 2022-03-09 DIAGNOSIS — E039 Hypothyroidism, unspecified: Secondary | ICD-10-CM | POA: Diagnosis not present

## 2022-03-09 DIAGNOSIS — E538 Deficiency of other specified B group vitamins: Secondary | ICD-10-CM | POA: Diagnosis not present

## 2022-03-09 DIAGNOSIS — M5136 Other intervertebral disc degeneration, lumbar region: Secondary | ICD-10-CM | POA: Diagnosis not present

## 2022-03-09 DIAGNOSIS — F0283 Dementia in other diseases classified elsewhere, unspecified severity, with mood disturbance: Secondary | ICD-10-CM | POA: Diagnosis not present

## 2022-03-09 DIAGNOSIS — G319 Degenerative disease of nervous system, unspecified: Secondary | ICD-10-CM | POA: Diagnosis not present

## 2022-03-09 NOTE — Telephone Encounter (Signed)
TC Amy PT w/ [facility] Saw pt at [facility] assisted living for PT. Pt was very agitated & anxious. Amy tried for about 30 mins, could not get patient to do anything. Talk with pt's daughter in length about this about giving it a couple of weeks to settle down since she just moved into [facility]. She is not on any medications at this time besides her Miralax. Amy will be glad to re-assess her when things get settled, will just need a new order, will not need a new F2F visit if it is within 90 days.

## 2022-03-10 NOTE — Telephone Encounter (Signed)
Noted. Ok to place new order when needed.

## 2022-03-10 NOTE — Telephone Encounter (Signed)
Spoke with Amy. She will call the office back when order is needed to reassess pt. Will send message to Tye [place] to make her aware. Per Amy she will do the assessment within the 90 day time frame.

## 2022-03-21 DIAGNOSIS — E1122 Type 2 diabetes mellitus with diabetic chronic kidney disease: Secondary | ICD-10-CM | POA: Diagnosis not present

## 2022-03-21 DIAGNOSIS — F0154 Vascular dementia, unspecified severity, with anxiety: Secondary | ICD-10-CM | POA: Diagnosis not present

## 2022-03-21 DIAGNOSIS — N183 Chronic kidney disease, stage 3 unspecified: Secondary | ICD-10-CM | POA: Diagnosis not present

## 2022-03-21 DIAGNOSIS — I10 Essential (primary) hypertension: Secondary | ICD-10-CM | POA: Diagnosis not present

## 2022-03-21 DIAGNOSIS — R296 Repeated falls: Secondary | ICD-10-CM | POA: Diagnosis not present

## 2022-04-04 ENCOUNTER — Ambulatory Visit (INDEPENDENT_AMBULATORY_CARE_PROVIDER_SITE_OTHER): Payer: Medicare Other

## 2022-04-04 DIAGNOSIS — F02818 Dementia in other diseases classified elsewhere, unspecified severity, with other behavioral disturbance: Secondary | ICD-10-CM | POA: Diagnosis not present

## 2022-04-04 DIAGNOSIS — D509 Iron deficiency anemia, unspecified: Secondary | ICD-10-CM

## 2022-04-04 DIAGNOSIS — M5136 Other intervertebral disc degeneration, lumbar region: Secondary | ICD-10-CM

## 2022-04-04 DIAGNOSIS — F0283 Dementia in other diseases classified elsewhere, unspecified severity, with mood disturbance: Secondary | ICD-10-CM | POA: Diagnosis not present

## 2022-04-04 DIAGNOSIS — I890 Lymphedema, not elsewhere classified: Secondary | ICD-10-CM

## 2022-04-04 DIAGNOSIS — E538 Deficiency of other specified B group vitamins: Secondary | ICD-10-CM | POA: Diagnosis not present

## 2022-04-04 DIAGNOSIS — G319 Degenerative disease of nervous system, unspecified: Secondary | ICD-10-CM | POA: Diagnosis not present

## 2022-04-04 DIAGNOSIS — F331 Major depressive disorder, recurrent, moderate: Secondary | ICD-10-CM

## 2022-04-04 DIAGNOSIS — E039 Hypothyroidism, unspecified: Secondary | ICD-10-CM

## 2022-04-04 DIAGNOSIS — F0284 Dementia in other diseases classified elsewhere, unspecified severity, with anxiety: Secondary | ICD-10-CM

## 2022-04-04 DIAGNOSIS — I719 Aortic aneurysm of unspecified site, without rupture: Secondary | ICD-10-CM

## 2022-04-04 DIAGNOSIS — I129 Hypertensive chronic kidney disease with stage 1 through stage 4 chronic kidney disease, or unspecified chronic kidney disease: Secondary | ICD-10-CM

## 2022-04-04 DIAGNOSIS — F5105 Insomnia due to other mental disorder: Secondary | ICD-10-CM

## 2022-04-04 DIAGNOSIS — G609 Hereditary and idiopathic neuropathy, unspecified: Secondary | ICD-10-CM

## 2022-04-04 DIAGNOSIS — N1832 Chronic kidney disease, stage 3b: Secondary | ICD-10-CM

## 2022-04-06 DIAGNOSIS — E43 Unspecified severe protein-calorie malnutrition: Secondary | ICD-10-CM | POA: Diagnosis not present

## 2022-04-06 DIAGNOSIS — E0821 Diabetes mellitus due to underlying condition with diabetic nephropathy: Secondary | ICD-10-CM | POA: Diagnosis not present

## 2022-04-06 DIAGNOSIS — Z9181 History of falling: Secondary | ICD-10-CM | POA: Diagnosis not present

## 2022-04-06 DIAGNOSIS — N183 Chronic kidney disease, stage 3 unspecified: Secondary | ICD-10-CM | POA: Diagnosis not present

## 2022-04-11 DIAGNOSIS — F0394 Unspecified dementia, unspecified severity, with anxiety: Secondary | ICD-10-CM | POA: Diagnosis not present

## 2022-04-25 DIAGNOSIS — Z9181 History of falling: Secondary | ICD-10-CM | POA: Diagnosis not present

## 2022-04-25 DIAGNOSIS — E0821 Diabetes mellitus due to underlying condition with diabetic nephropathy: Secondary | ICD-10-CM | POA: Diagnosis not present

## 2022-04-25 DIAGNOSIS — E43 Unspecified severe protein-calorie malnutrition: Secondary | ICD-10-CM | POA: Diagnosis not present

## 2022-04-25 DIAGNOSIS — N183 Chronic kidney disease, stage 3 unspecified: Secondary | ICD-10-CM | POA: Diagnosis not present

## 2022-05-02 DIAGNOSIS — I10 Essential (primary) hypertension: Secondary | ICD-10-CM | POA: Diagnosis not present

## 2022-05-02 DIAGNOSIS — N183 Chronic kidney disease, stage 3 unspecified: Secondary | ICD-10-CM | POA: Diagnosis not present

## 2022-05-02 DIAGNOSIS — R296 Repeated falls: Secondary | ICD-10-CM | POA: Diagnosis not present

## 2022-05-02 DIAGNOSIS — F0154 Vascular dementia, unspecified severity, with anxiety: Secondary | ICD-10-CM | POA: Diagnosis not present

## 2022-05-02 DIAGNOSIS — E1122 Type 2 diabetes mellitus with diabetic chronic kidney disease: Secondary | ICD-10-CM | POA: Diagnosis not present

## 2022-05-07 DIAGNOSIS — E43 Unspecified severe protein-calorie malnutrition: Secondary | ICD-10-CM | POA: Diagnosis not present

## 2022-05-07 DIAGNOSIS — N183 Chronic kidney disease, stage 3 unspecified: Secondary | ICD-10-CM | POA: Diagnosis not present

## 2022-05-07 DIAGNOSIS — Z9181 History of falling: Secondary | ICD-10-CM | POA: Diagnosis not present

## 2022-05-07 DIAGNOSIS — E0821 Diabetes mellitus due to underlying condition with diabetic nephropathy: Secondary | ICD-10-CM | POA: Diagnosis not present

## 2022-05-08 DIAGNOSIS — I10 Essential (primary) hypertension: Secondary | ICD-10-CM | POA: Diagnosis not present

## 2022-05-10 DIAGNOSIS — F0394 Unspecified dementia, unspecified severity, with anxiety: Secondary | ICD-10-CM | POA: Diagnosis not present

## 2022-05-13 IMAGING — CT CT HEAD W/O CM
3 of 6 series · 14 of 47 positions shown, 16 images · non-contrast
Comparison: No pertinent prior studies available for comparison.

CLINICAL DATA: Head trauma, headache.

EXAM:
CT HEAD WITHOUT CONTRAST
TECHNIQUE: Contiguous axial images were obtained from the base of the skull
through the vertex without intravenous contrast.

[Series 3: head w o · axial · 0.42mm/px · z∈[-4,+111]mm · 8 of 31 slices shown, 10 images]
[im 4/31  brain]
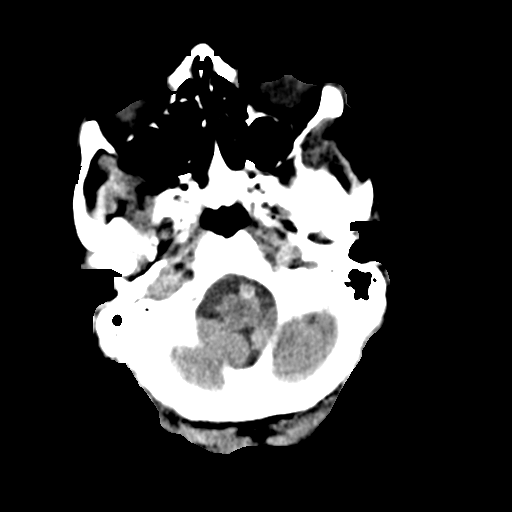
[im 4/31  bone]
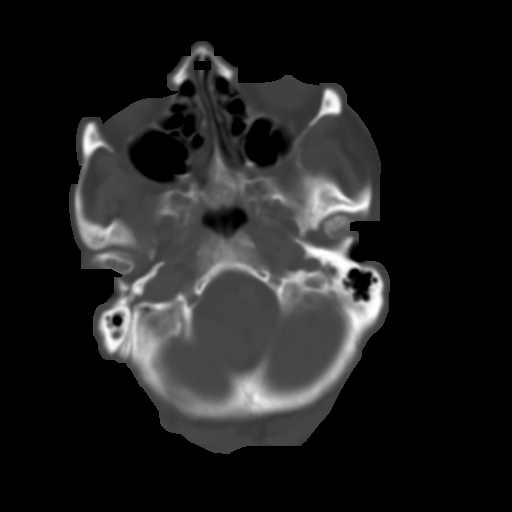
[im 7/31  brain]
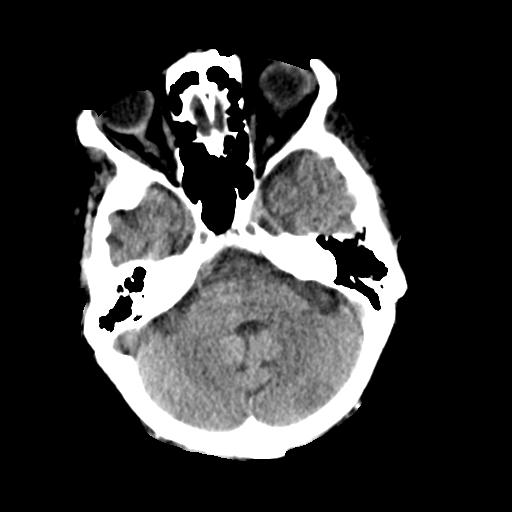
[im 10/31  brain]
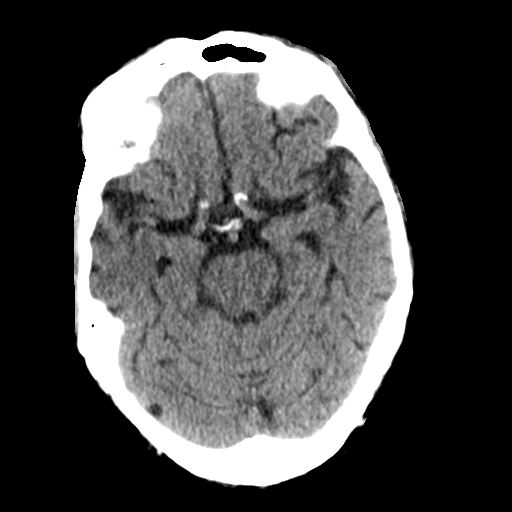
[im 13/31  brain]
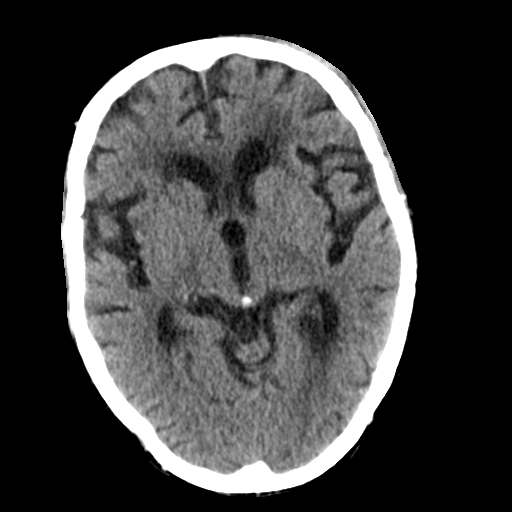
[im 18/31  brain]
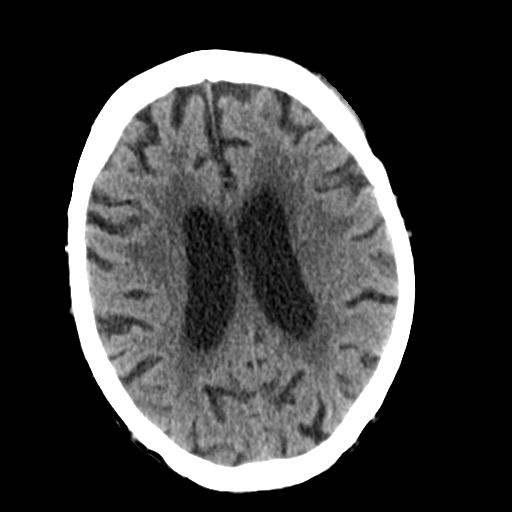
[im 18/31  bone]
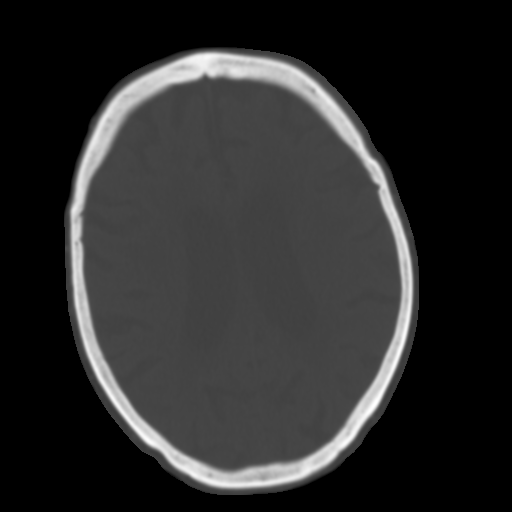
[im 21/31  brain]
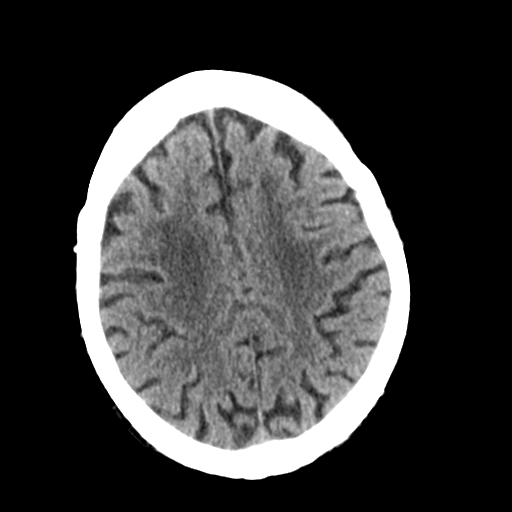
[im 24/31  brain]
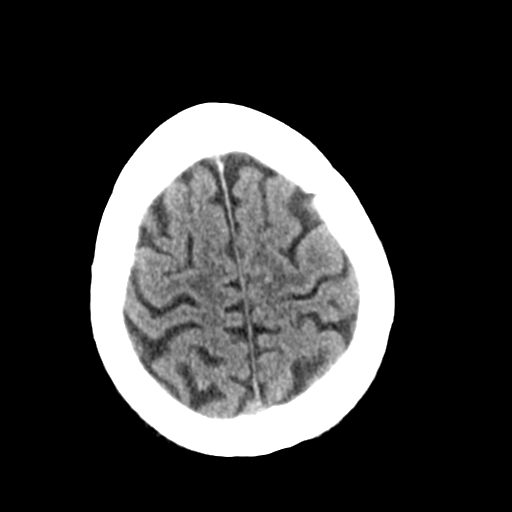
[im 27/31  brain]
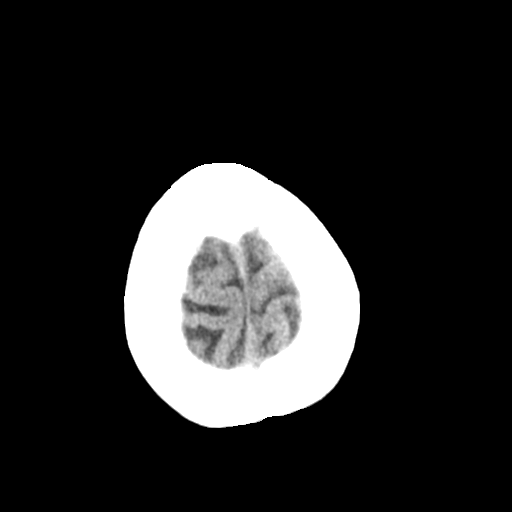

[Series 5: coronal soft · coronal · 0.23mm/px · 3 of 69 slices shown]
[im 18/69  brain]
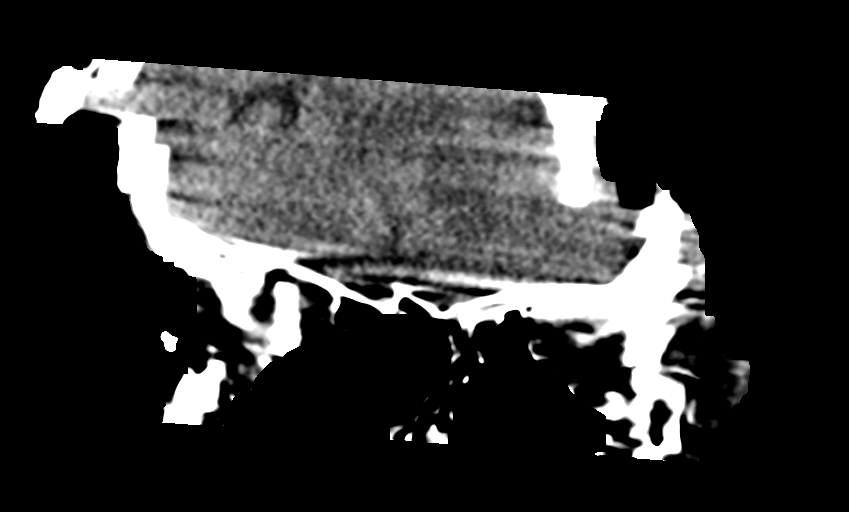
[im 35/69  brain]
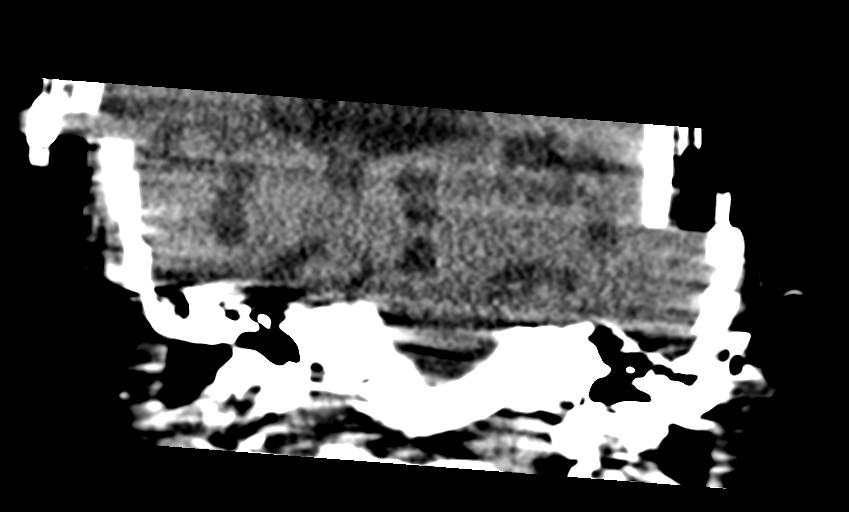
[im 52/69  brain]
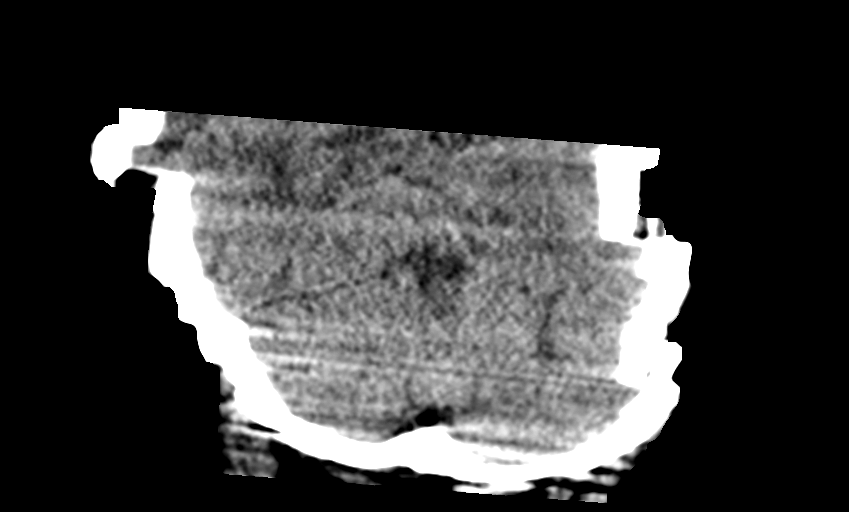

[Series 9: sagittal soft · sagittal · 0.35mm/px · 3 of 64 slices shown]
[im 13/64  brain]
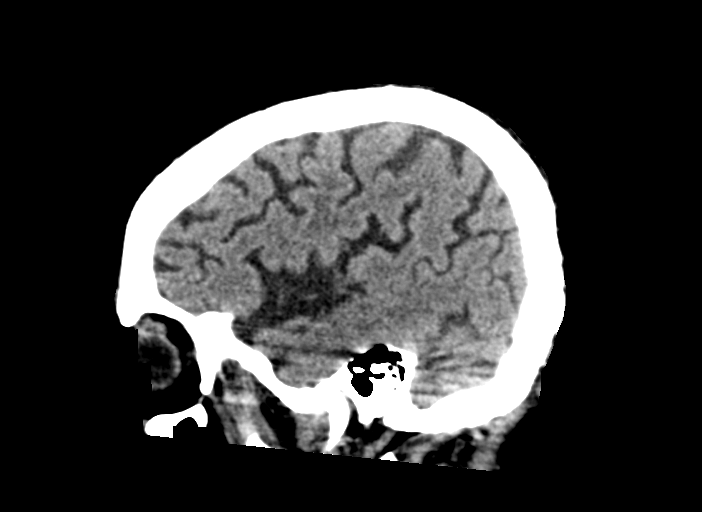
[im 26/64  brain]
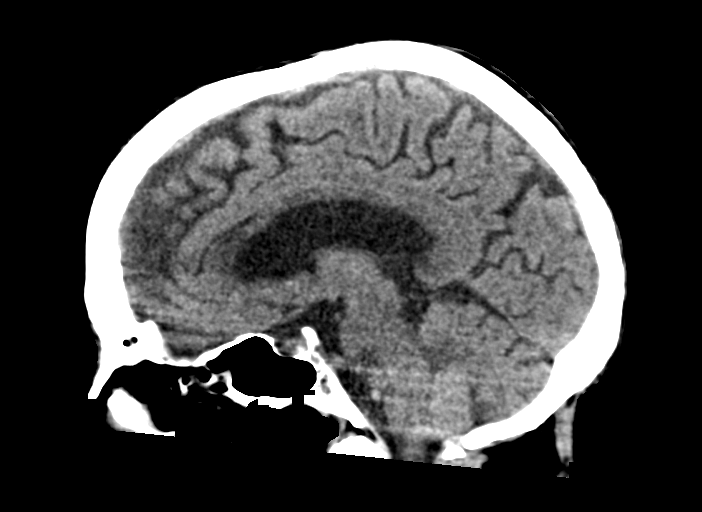
[im 38/64  brain]
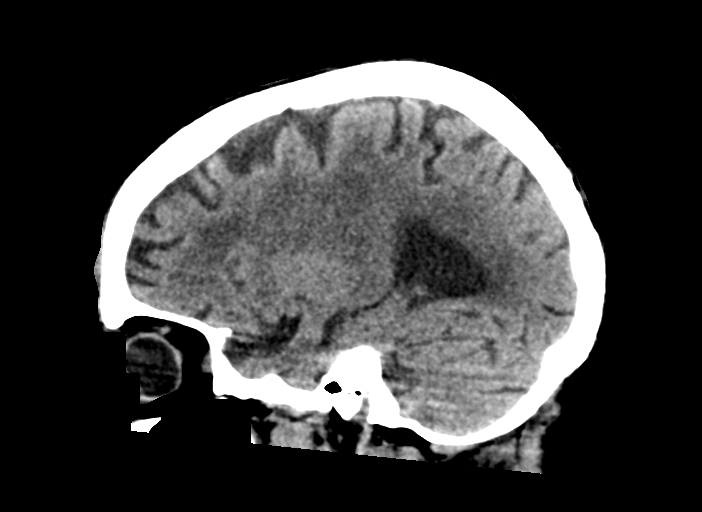

[14 of 47 positions shown; findings below may reference images not displayed]

FINDINGS: Brain:

Moderate generalized parenchymal atrophy.

Small chronic infarct within the left cerebellum.

Moderate patchy hypoattenuation within the cerebral white matter is
nonspecific, but consistent with chronic small vessel ischemic
disease.

There is no acute intracranial hemorrhage.

No acute demarcated cortical infarct is identified.

No extra-axial fluid collection.

No evidence of intracranial mass.

No midline shift.

Vascular: No hyperdense vessel.  Atherosclerotic calcifications.

Skull: Normal. Negative for fracture or focal lesion.

Sinuses/Orbits: Left periorbital soft tissue hematoma. Otherwise, no
evidence of acute orbital abnormality. Mild ethmoid sinus mucosal
thickening at the imaged levels.

Other: Sizable left frontotemporal scalp/left periorbital hematoma.
IMPRESSION: No evidence of acute intracranial abnormality.

Sizable left frontotemporal scalp/left periorbital hematoma.

Moderate generalized parenchymal atrophy and chronic small vessel
ischemic disease.

Small chronic infarct within the left cerebellum.

Mild ethmoid sinus mucosal thickening at the imaged levels.

## 2022-05-30 DIAGNOSIS — N183 Chronic kidney disease, stage 3 unspecified: Secondary | ICD-10-CM | POA: Diagnosis not present

## 2022-05-30 DIAGNOSIS — E1122 Type 2 diabetes mellitus with diabetic chronic kidney disease: Secondary | ICD-10-CM | POA: Diagnosis not present

## 2022-05-30 DIAGNOSIS — F0394 Unspecified dementia, unspecified severity, with anxiety: Secondary | ICD-10-CM | POA: Diagnosis not present

## 2022-05-30 DIAGNOSIS — I129 Hypertensive chronic kidney disease with stage 1 through stage 4 chronic kidney disease, or unspecified chronic kidney disease: Secondary | ICD-10-CM | POA: Diagnosis not present

## 2022-06-06 DIAGNOSIS — E43 Unspecified severe protein-calorie malnutrition: Secondary | ICD-10-CM | POA: Diagnosis not present

## 2022-06-06 DIAGNOSIS — E0821 Diabetes mellitus due to underlying condition with diabetic nephropathy: Secondary | ICD-10-CM | POA: Diagnosis not present

## 2022-06-06 DIAGNOSIS — Z9181 History of falling: Secondary | ICD-10-CM | POA: Diagnosis not present

## 2022-06-06 DIAGNOSIS — N183 Chronic kidney disease, stage 3 unspecified: Secondary | ICD-10-CM | POA: Diagnosis not present

## 2022-06-18 DIAGNOSIS — F0154 Vascular dementia, unspecified severity, with anxiety: Secondary | ICD-10-CM | POA: Diagnosis not present

## 2022-06-24 ENCOUNTER — Emergency Department (HOSPITAL_COMMUNITY)
Admission: EM | Admit: 2022-06-24 | Discharge: 2022-06-24 | Disposition: A | Discharge status: Skilled Nursing Facility | Payer: Medicare Other | Attending: Emergency Medicine | Admitting: Emergency Medicine

## 2022-06-24 ENCOUNTER — Encounter (HOSPITAL_COMMUNITY): Payer: Self-pay

## 2022-06-24 ENCOUNTER — Other Ambulatory Visit: Payer: Self-pay

## 2022-06-24 DIAGNOSIS — Z743 Need for continuous supervision: Secondary | ICD-10-CM | POA: Diagnosis not present

## 2022-06-24 DIAGNOSIS — F0390 Unspecified dementia without behavioral disturbance: Secondary | ICD-10-CM | POA: Diagnosis present

## 2022-06-24 DIAGNOSIS — R6889 Other general symptoms and signs: Secondary | ICD-10-CM | POA: Diagnosis not present

## 2022-06-24 DIAGNOSIS — W19XXXA Unspecified fall, initial encounter: Secondary | ICD-10-CM | POA: Diagnosis not present

## 2022-06-24 DIAGNOSIS — R404 Transient alteration of awareness: Secondary | ICD-10-CM | POA: Diagnosis not present

## 2022-06-24 DIAGNOSIS — R0902 Hypoxemia: Secondary | ICD-10-CM | POA: Diagnosis not present

## 2022-06-24 NOTE — ED Provider Notes (Signed)
[facility] EMERGENCY DEPARTMENT Provider Note   CSN: [id number] Arrival date & time: 06/24/22  0830     History Chief Complaint  Patient presents with   Fall    HPI Kelly Freeman is a 88 y.o. female presenting for fall.  Patient found on the ground this morning at her skilled nursing facility.  Patient does not appear to be in any distress but cannot provide any further collateral history.  Patient is placed in a dementia unit.  Attempted to call family with no response at this time.  Voicemail left, will attempt to get more history from family.   On second phone call at 10:00, I was able to be connected with patient's daughter and power of attorney as well as legal guardian.  She endorses that the patient has had frequent falls at her facility without injury and has been evaluated multiple times for this over the past year.  She stated that the skilled nursing assistant who is present this morning has a history of sending the patient out without talking to the family first. Family member denies any prodrome fevers chills, nausea vomiting syncope shortness of breath and she visits the family member frequently.  She feels the family member is at her baseline now based on description.  She states that patient has a DNR and would not want any operative or aggressive interventions given the severity of her dementia  Patient's recorded medical, surgical, social, medication list and allergies were reviewed in the Snapshot window as part of the initial history.   Review of Systems   Review of Systems  Unable to perform ROS: Dementia    Physical Exam Updated Vital Signs BP (!) 172/82 (BP Location: Left Arm)   Pulse 82   Temp 98 F (36.7 C) (Oral)   Resp 16   SpO2 98%  Physical Exam Physical Exam  Neurologic: GCS 14, motor intact in all four extremities, sensory intact in all 4 extremities  Head: Pupils are 93m, equally round and reactive to light, patient has no obvious facial  trauma, no hemotympanum  Neck: patient has no midline neck tenderness, no obvious injuries.  Thorax: Patient has stable clavicles, stable thorax with bilateral chest rise and breath sounds heard.  No penetrating thoracic injury.  CV/Pulm: RRR, no audible murmer/rubs/gallops, CTAB  Abdomen: Patient has no abdominal distention, no penetrating abdominal injury.  Back: Patient has no midline spinal tenderness in the thoracic and lumbar spine, patient has no paraspinal tenderness bilaterally.  Pelvis: Patient has a stable pelvis to compression with palpable femoral pulses.  Extremities:Patient's upper extremities with no obvious injury or abnormality, radial pulses present. Patient's lower extremities with no obvious injury or abnormality, tibial pulses present.    ED Course/ Medical Decision Making/ A&P Clinical Course as of 06/24/22 1016  Sat Jun 24, 2022  0858 Attempted to call family member LSintia Mckissicthis morning.  Phone call sent voicemail.  Voicemail left.  Will attempt to talk to family. [CC]    Clinical Course User Index [CC] CTretha Sciara MD    Procedures Procedures   Medications Ordered in ED Medications - No data to display Medical Decision Making:    BRenesmay Nesbittis a 88y.o. female who presented to the ED today with a moderate mechanisma trauma, detailed above.    Handoff received from EMS.  Additional history discussed with patient's family/caregivers.  Patient's presentation is complicated by their history of advanced age with advanced stage dementia.  Patient placed on continuous vitals  and telemetry monitoring while in ED which was reviewed periodically.   Given this mechanism of trauma, a full physical exam was performed. Notably, patient was hemodynamically stable with no acute pathology appreciated.  Ranged all joints without any patient complaint of pain.   Reviewed and confirmed nursing documentation for past medical history, family history, social  history.    Initial Assessment/Plan:   This is a patient presenting with a moderate mechanism trauma.  As such, I have considered intracranial injuries including intracranial hemorrhage, intrathoracic injuries including blunt myocardial or blunt lung injury, blunt abdominal injuries including aortic dissection, bladder injury, spleen injury, liver injury and I have considered orthopedic injuries including extremity or spinal injury.  With the patient's presentation of moderate mechanism trauma but with an otherwise reassuring exam, there is no indication for further objective evaluation at this time. Patient ambulating, tolerating PO intake and in no acute distress stable for continued OP care and management. Supportive care reinforced and patient is overall well appearing in no acute distress stable for continued OP care and management.   Called patient's daughter and offered CT head, CT C-spine, targeted x-rays for evaluation of potential traumatic etiology.  [name] states that the patient would not want undergo any aggressive therapeutic interventions except for pain and anxiety treatment.  Based on this discussion, I do not feel that pursuing cross-sectional imaging would be beneficial for patient's care.  Given that she has no obvious deformities and is comfortably resting at this time, I believe the patient could be followed up in the outpatient setting with her primary care provider and continuation of her outpatient care plan. We explicitly discussed risk of missed intracranial bleed, missed long bone fracture, missed cervical fracture and family expressed understanding.  Given low pretest probability and otherwise well appearance, they feel comfortable with no further acute evaluation at this time.  Patient arranged for immediate transfer back to her facility discharged with no further acute events. Disposition:  I have considered need for hospitalization, however, patient's current  presentation favors discharge at this time.  Patient/family educated about specific return precautions for given chief complaint and symptoms.  Patient/family educated about follow-up with PCP .     Patient/family expressed understanding of return precautions and need for follow-up. Patient spoken to regarding all imaging and laboratory results and appropriate follow up for these results. All education provided in verbal form with additional information in written form. Time was allowed for answering of patient questions. Patient discharged.    Emergency Department Medication Summary:   Medications - No data to display       Clinical Impression:  1. Fall, initial encounter      Discharge   Final Clinical Impression(s) / ED Diagnoses Final diagnoses:  Fall, initial encounter    Rx / DC Orders ED Discharge Orders     None         Tretha Sciara, MD 06/24/22 1016

## 2022-06-24 NOTE — ED Notes (Signed)
Legal Guardian, Reiana Poteet notified of transfer back to SNF

## 2022-06-24 NOTE — ED Notes (Signed)
Spoke with Sharyn Lull at [facility] faxed AVS forms to facility [phone]

## 2022-06-24 NOTE — ED Triage Notes (Signed)
Patient  presents to ED via [facility], pt resides at  [facility].  EMS reports alert and disoriented x 4  pt. fell out of wheelchair in the dementia unit and hit her head. Patient at present moaning when touched. Report given from EMS this is her baseline. No lacerations noted to head.

## 2022-06-25 ENCOUNTER — Emergency Department (HOSPITAL_COMMUNITY): Payer: Medicare Other

## 2022-06-25 ENCOUNTER — Encounter (HOSPITAL_COMMUNITY): Payer: Self-pay

## 2022-06-25 ENCOUNTER — Other Ambulatory Visit: Payer: Self-pay

## 2022-06-25 ENCOUNTER — Emergency Department (HOSPITAL_COMMUNITY)
Admission: EM | Admit: 2022-06-25 | Discharge: 2022-06-25 | Disposition: A | Discharge status: Home / Self Care | Payer: Medicare Other | Attending: Emergency Medicine | Admitting: Emergency Medicine

## 2022-06-25 DIAGNOSIS — I6523 Occlusion and stenosis of bilateral carotid arteries: Secondary | ICD-10-CM | POA: Diagnosis not present

## 2022-06-25 DIAGNOSIS — R4182 Altered mental status, unspecified: Secondary | ICD-10-CM | POA: Diagnosis present

## 2022-06-25 DIAGNOSIS — R0682 Tachypnea, not elsewhere classified: Secondary | ICD-10-CM | POA: Diagnosis not present

## 2022-06-25 DIAGNOSIS — Z743 Need for continuous supervision: Secondary | ICD-10-CM | POA: Diagnosis not present

## 2022-06-25 DIAGNOSIS — I10 Essential (primary) hypertension: Secondary | ICD-10-CM | POA: Diagnosis not present

## 2022-06-25 DIAGNOSIS — N390 Urinary tract infection, site not specified: Secondary | ICD-10-CM | POA: Diagnosis not present

## 2022-06-25 DIAGNOSIS — R2681 Unsteadiness on feet: Secondary | ICD-10-CM | POA: Diagnosis not present

## 2022-06-25 DIAGNOSIS — F03C11 Unspecified dementia, severe, with agitation: Secondary | ICD-10-CM

## 2022-06-25 DIAGNOSIS — I672 Cerebral atherosclerosis: Secondary | ICD-10-CM | POA: Diagnosis not present

## 2022-06-25 DIAGNOSIS — S0990XA Unspecified injury of head, initial encounter: Secondary | ICD-10-CM | POA: Diagnosis not present

## 2022-06-25 LAB — CBC WITH DIFFERENTIAL/PLATELET
Abs Immature Granulocytes: 0.06 10*3/uL (ref 0.00–0.07)
Basophils Absolute: 0 10*3/uL (ref 0.0–0.1)
Basophils Relative: 0 %
Eosinophils Absolute: 0.1 10*3/uL (ref 0.0–0.5)
Eosinophils Relative: 1 %
HCT: 41.3 % (ref 36.0–46.0)
Hemoglobin: 13.3 g/dL (ref 12.0–15.0)
Immature Granulocytes: 1 %
Lymphocytes Relative: 13 %
Lymphs Abs: 1.4 10*3/uL (ref 0.7–4.0)
MCH: 28.9 pg (ref 26.0–34.0)
MCHC: 32.2 g/dL (ref 30.0–36.0)
MCV: 89.8 fL (ref 80.0–100.0)
Monocytes Absolute: 0.9 10*3/uL (ref 0.1–1.0)
Monocytes Relative: 9 %
Neutro Abs: 8.1 10*3/uL — ABNORMAL HIGH (ref 1.7–7.7)
Neutrophils Relative %: 76 %
Platelets: 235 10*3/uL (ref 150–400)
RBC: 4.6 MIL/uL (ref 3.87–5.11)
RDW: 13.2 % (ref 11.5–15.5)
WBC: 10.5 10*3/uL (ref 4.0–10.5)
nRBC: 0 % (ref 0.0–0.2)

## 2022-06-25 LAB — URINALYSIS, ROUTINE W REFLEX MICROSCOPIC
Bilirubin Urine: NEGATIVE
Glucose, UA: NEGATIVE mg/dL
Hgb urine dipstick: NEGATIVE
Ketones, ur: 5 mg/dL — AB
Leukocytes,Ua: NEGATIVE
Nitrite: NEGATIVE
Protein, ur: 30 mg/dL — AB
Specific Gravity, Urine: 1.013 (ref 1.005–1.030)
pH: 8 (ref 5.0–8.0)

## 2022-06-25 LAB — BASIC METABOLIC PANEL
Anion gap: 10 (ref 5–15)
BUN: 33 mg/dL — ABNORMAL HIGH (ref 8–23)
CO2: 27 mmol/L (ref 22–32)
Calcium: 9.6 mg/dL (ref 8.9–10.3)
Chloride: 103 mmol/L (ref 98–111)
Creatinine, Ser: 1.25 mg/dL — ABNORMAL HIGH (ref 0.44–1.00)
GFR, Estimated: 40 mL/min — ABNORMAL LOW (ref >60)
Glucose, Bld: 104 mg/dL — ABNORMAL HIGH (ref 70–99)
Potassium: 5.1 mmol/L (ref 3.5–5.1)
Sodium: 140 mmol/L (ref 135–145)

## 2022-06-25 MED ORDER — CEPHALEXIN 500 MG PO CAPS: 500.0000 mg | ORAL_CAPSULE | Freq: Two times a day (BID) | ORAL | 0 refills | Status: AC

## 2022-06-25 MED ORDER — LACTATED RINGERS IV BOLUS
1000.0000 mL | Freq: Once | INTRAVENOUS | Status: AC
  Administered 2022-06-25: 1000 mL via INTRAVENOUS

## 2022-06-25 MED ORDER — CEPHALEXIN 500 MG PO CAPS
500.0000 mg | ORAL_CAPSULE | Freq: Once | ORAL | Status: AC
  Administered 2022-06-25: 500 mg via ORAL
  Filled 2022-06-25: qty 1

## 2022-06-25 NOTE — ED Notes (Signed)
Attempted to call report to [facility] but no answer.

## 2022-06-25 NOTE — ED Provider Notes (Signed)
[facility] EMERGENCY DEPARTMENT Provider Note   CSN: [id number] Arrival date & time: 06/25/22  0715     History Chief Complaint  Patient presents with   Altered Mental Status    HPI Kelly Freeman is a 88 y.o. female presenting for decreased responsiveness this morning.  Per nursing facility, patient was difficult to awaken this morning.  Arrival here, she is alert similar to exam yesterday.  Patient is resting comfortably but easily irritated and was noted to be biting at staff who are attempting to evaluate her.  Patient returns resting comfortably when not being evaluated by staff.  Patient's recorded medical, surgical, social, medication list and allergies were reviewed in the Snapshot window as part of the initial history.   Review of Systems   Review of Systems  Unable to perform ROS: Dementia    Physical Exam Updated Vital Signs BP (!) 162/84   Pulse 79   Temp (!) 97.5 F (36.4 C) (Axillary)   Resp 15   Ht '5\' 6"'$  (1.676 m)   Wt 55.9 kg   SpO2 98%   BMI 19.89 kg/m  Physical Exam Vitals and nursing note reviewed.  Constitutional:      General: She is not in acute distress.    Appearance: She is well-developed.  HENT:     Head: Normocephalic and atraumatic.  Eyes:     Conjunctiva/sclera: Conjunctivae normal.  Cardiovascular:     Rate and Rhythm: Normal rate and regular rhythm.     Heart sounds: No murmur heard. Pulmonary:     Effort: Pulmonary effort is normal. No respiratory distress.     Breath sounds: Normal breath sounds.  Abdominal:     General: There is no distension.     Palpations: Abdomen is soft.     Tenderness: There is no abdominal tenderness. There is no right CVA tenderness or left CVA tenderness.  Musculoskeletal:        General: No swelling or tenderness. Normal range of motion.     Cervical back: Neck supple.  Skin:    General: Skin is warm and dry.  Neurological:     General: No focal deficit present.     Mental Status: She is alert  and oriented to person, place, and time. Mental status is at baseline.     Cranial Nerves: No cranial nerve deficit.      ED Course/ Medical Decision Making/ A&P Clinical Course as of 06/25/22 1455  Sun Jun 25, 2022  1151 Pending ua [CC]    Clinical Course User Index [CC] Tretha Sciara, MD    Procedures Procedures   Medications Ordered in ED Medications  lactated ringers bolus 1,000 mL (0 mLs Intravenous Stopped 06/25/22 1107)  lactated ringers bolus 1,000 mL (0 mLs Intravenous Stopped 06/25/22 1311)  cephALEXin (KEFLEX) capsule 500 mg (500 mg Oral Given 06/25/22 1234)    Medical Decision Making:    Kelly Freeman is a 88 y.o. female who presented to the ED today with altered mental status detailed above.     Handoff received from EMS.  Additional history discussed with patient's family/caregivers.  Patient's presentation is complicated by their history of multiple comorbid medical conditions including advanced age with advanced dementia.  Patient placed on continuous vitals and telemetry monitoring while in ED which was reviewed periodically.   Complete initial physical exam performed, notably the patient  was hemodynamically stable in no acute distress.  Patient resting comfortably during my evaluation.      Reviewed and confirmed  nursing documentation for past medical history, family history, social history.    Initial Assessment:   With the patient's presentation of altered mental status, most likely diagnosis is ongoing delirium in the setting of dementia. Other diagnoses were considered including (but not limited to) metabolic disruption, infection, intracranial hemorrhage, intracranial mass. These are considered less likely due to history of present illness and physical exam findings.   This is most consistent with an acute life/limb threatening illness complicated by underlying chronic conditions.  Initial Plan:  CT head to evaluate for structural intracranial  pathology Screening labs including CBC and Metabolic panel to evaluate for infectious or metabolic etiology of disease.  CXR to evaluate for structural/infectious intrathoracic pathology.  EKG to evaluate for cardiac pathology. Objective evaluation as below reviewed with plan for close reassessment  Initial Study Results:   Laboratory  All laboratory results reviewed without evidence of clinically relevant pathology.    EKG EKG was reviewed independently. Rate, rhythm, axis, intervals all examined and without medically relevant abnormality. ST segments without concerns for elevations.    Radiology  All images reviewed independently. Agree with radiology report at this time.   CT HEAD WO CONTRAST (5MM)  Result Date: 06/25/2022 CLINICAL DATA:  Head trauma. EXAM: CT HEAD WITHOUT CONTRAST TECHNIQUE: Contiguous axial images were obtained from the base of the skull through the vertex without intravenous contrast. RADIATION DOSE REDUCTION: This exam was performed according to the departmental dose-optimization program which includes automated exposure control, adjustment of the mA and/or kV according to patient size and/or use of iterative reconstruction technique. COMPARISON:  CT scan of the brain July 15, 2021 FINDINGS: Brain: Ventricles and sulci are prominent but stable. Extensive white matter changes are stable. No subdural, epidural, or subarachnoid hemorrhage. No mass effect or midline shift. Cerebellum, brainstem, and basal cisterns are normal. No acute cortical ischemia or infarct noted. Vascular: Calcified atherosclerotic changes are identified in the intracranial carotids. Skull: Normal. Negative for fracture or focal lesion. Sinuses/Orbits: No acute finding. Other: None. IMPRESSION: Chronic white matter changes. No acute intracranial abnormalities. Electronically Signed   By: Dorise Bullion III M.D.   On: 06/25/2022 08:11   DG Chest Portable 1 View  Result Date: 06/25/2022 CLINICAL  DATA:  Altered mental status EXAM: PORTABLE CHEST 1 VIEW COMPARISON:  Prior chest x-ray 08/19/2021 FINDINGS: Stable cardiac and mediastinal contours. Atherosclerotic calcifications present throughout the coronary arteries. Stable appearance of the lungs with diffuse mild bronchitic changes. No focal infiltrate, pleural effusion, pulmonary edema or pneumothorax. No acute osseous abnormality. IMPRESSION: Stable chest x-ray without evidence of acute cardiopulmonary process. Chronic bronchitic changes. Electronically Signed   By: Jacqulynn Cadet M.D.   On: 06/25/2022 08:03    Final Assessment and Plan:   All results grossly reassuring at this time.  Possible urinary tract infection.  Shared medical decision making with patient's daughter.  Given change in mental status, she would like to proceed with UTI treatment.  We will utilize Keflex and recommend patient follow close with primary care provider in the outpatient setting for reassessment to ensure clearance of her bacteriuria.  Patient has been able to tolerate p.o. intake and is in no acute distress at this time stable for outpatient care and management.  Patient discharged with no further acute events.    Clinical Impression:  1. Severe dementia with agitation, unspecified dementia type ([place])   2. Lower urinary tract infectious disease      Discharge   Final Clinical Impression(s) / ED Diagnoses Final  diagnoses:  Severe dementia with agitation, unspecified dementia type ([place])  Lower urinary tract infectious disease    Rx / DC Orders ED Discharge Orders          Ordered    cephALEXin (KEFLEX) 500 MG capsule  2 times daily        06/25/22 1242              Tretha Sciara, MD 06/25/22 1455

## 2022-06-25 NOTE — ED Notes (Signed)
Tor Netters. At bedside and update given

## 2022-06-25 NOTE — ED Notes (Signed)
Attempted I &O and not successful. Bladder scan showed 0 urine.

## 2022-06-25 NOTE — ED Triage Notes (Signed)
Pt arrived [facility] from [facility] in [place] (memory care unit) . Staff reported pt was unresponsive when they checked on her this morning. Pt is arousable at this time. 18 G to left wrist. Attempting to bite staff while attempting vitals.

## 2022-06-25 NOTE — ED Notes (Signed)
Pt drinking juice to promote urine.

## 2022-06-25 NOTE — ED Notes (Signed)
Pt unable to sign - due to AMS

## 2022-06-25 NOTE — Discharge Instructions (Addendum)
Kelly Freeman was seen today for altered mental status.  No substantial injury, possible urinary tract infection appreciated.  We will trial therapy of Keflex 500 mg twice daily for 5 days with plan for patient to follow-up with primary care provider for reassessment of ongoing symptoms.

## 2022-06-27 DIAGNOSIS — N390 Urinary tract infection, site not specified: Secondary | ICD-10-CM | POA: Diagnosis not present

## 2022-06-27 DIAGNOSIS — I129 Hypertensive chronic kidney disease with stage 1 through stage 4 chronic kidney disease, or unspecified chronic kidney disease: Secondary | ICD-10-CM | POA: Diagnosis not present

## 2022-06-27 DIAGNOSIS — F0154 Vascular dementia, unspecified severity, with anxiety: Secondary | ICD-10-CM | POA: Diagnosis not present

## 2022-06-27 DIAGNOSIS — R296 Repeated falls: Secondary | ICD-10-CM | POA: Diagnosis not present

## 2022-07-28 DEATH — deceased

## 2023-03-01 IMAGING — DX DG HIP (WITH OR WITHOUT PELVIS) 2-3V*L*
3 series · 3 of 3 positions shown · non-contrast
Comparison: None.

CLINICAL DATA: Left hip pain after fall 2 days ago.

EXAM:
DG HIP (WITH OR WITHOUT PELVIS) 2-3V LEFT

[pelvis ap]
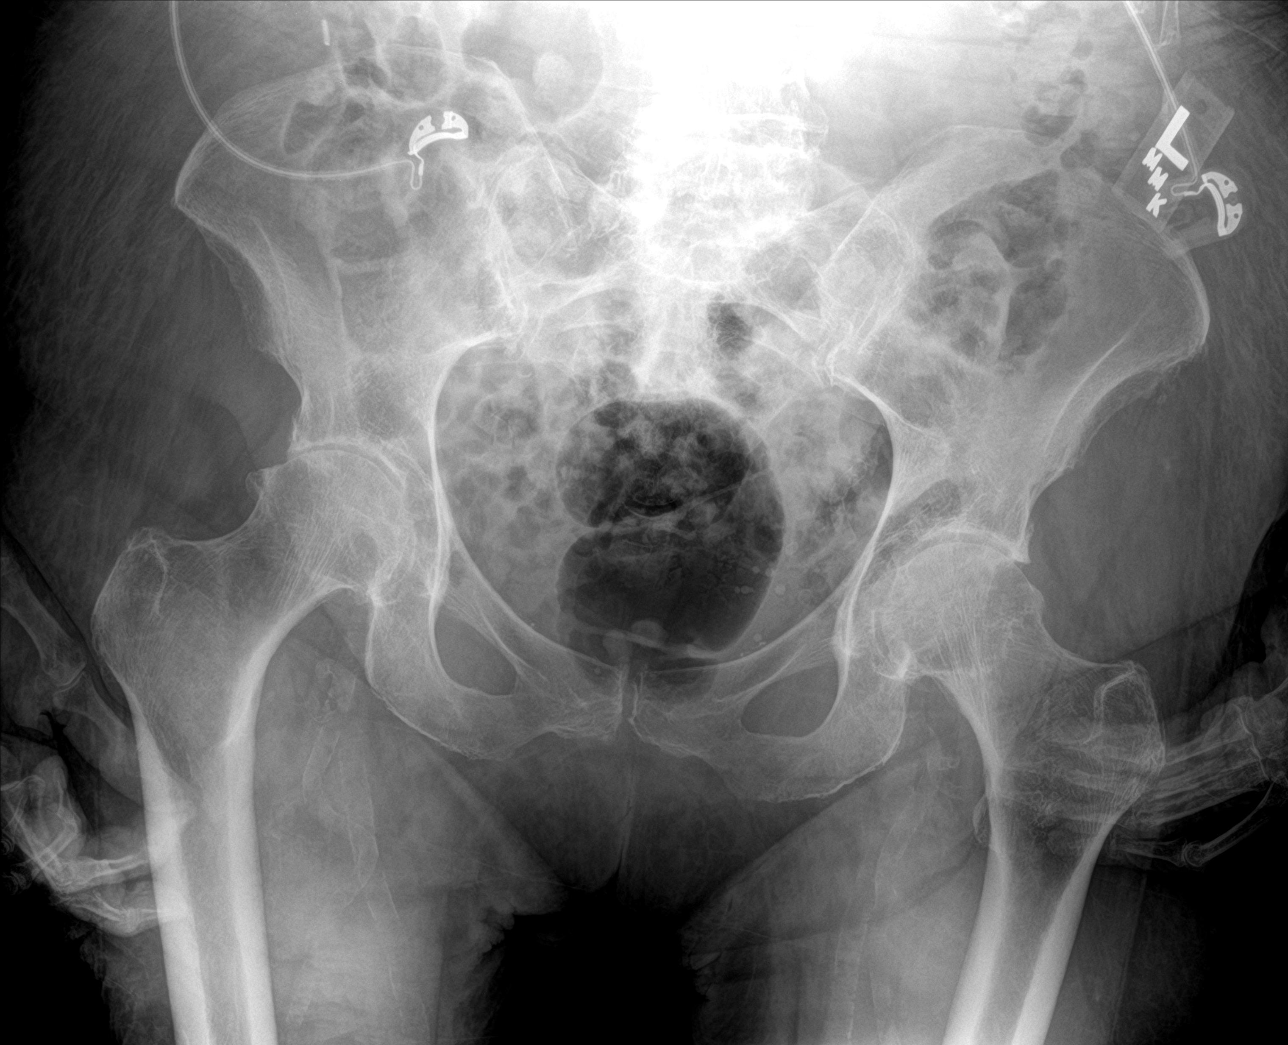

[hip ap]
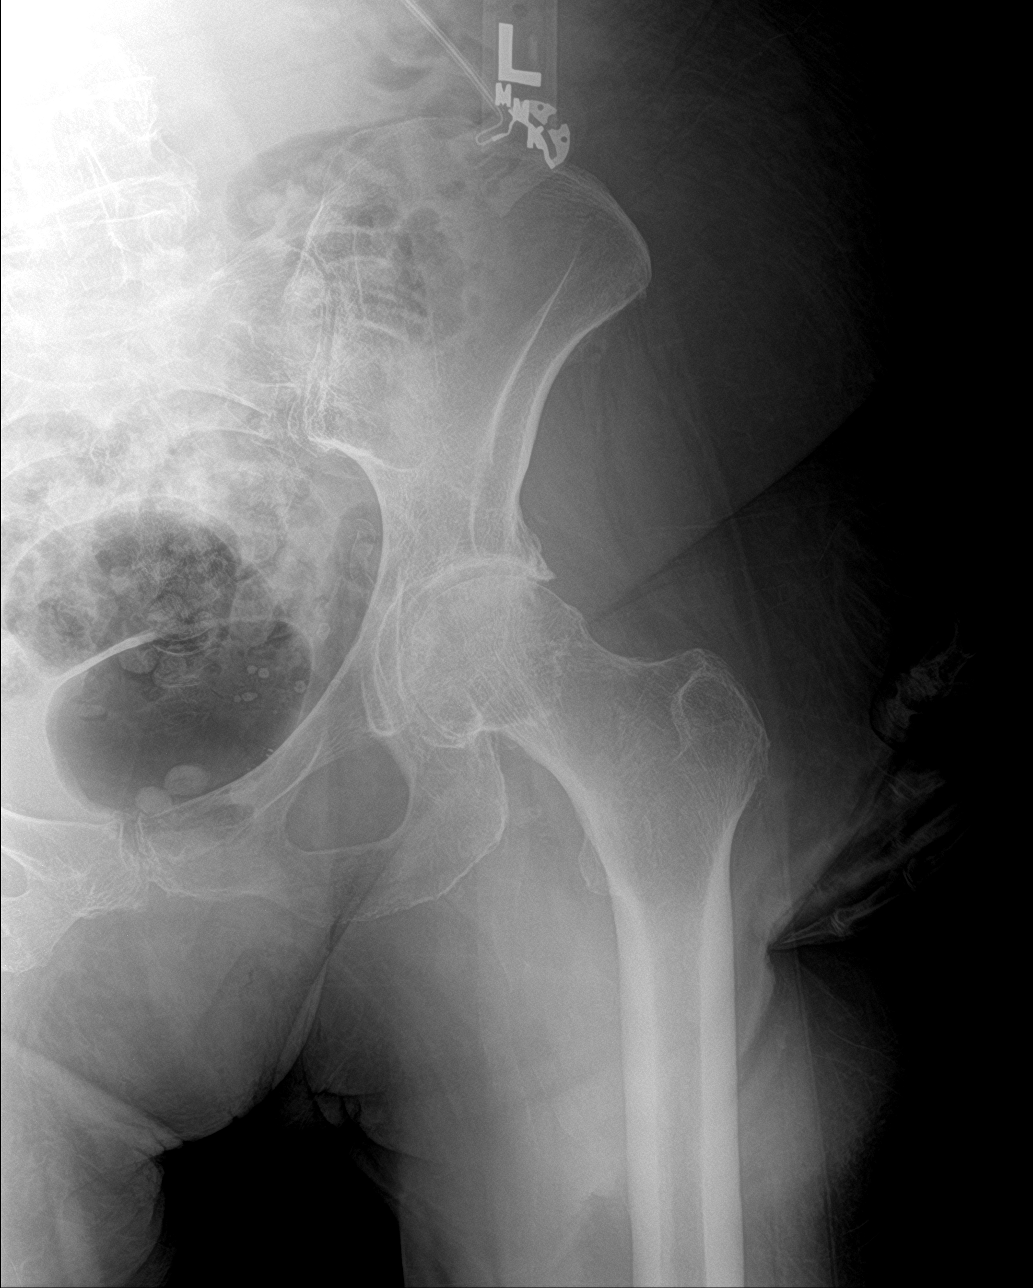

[hip lat]
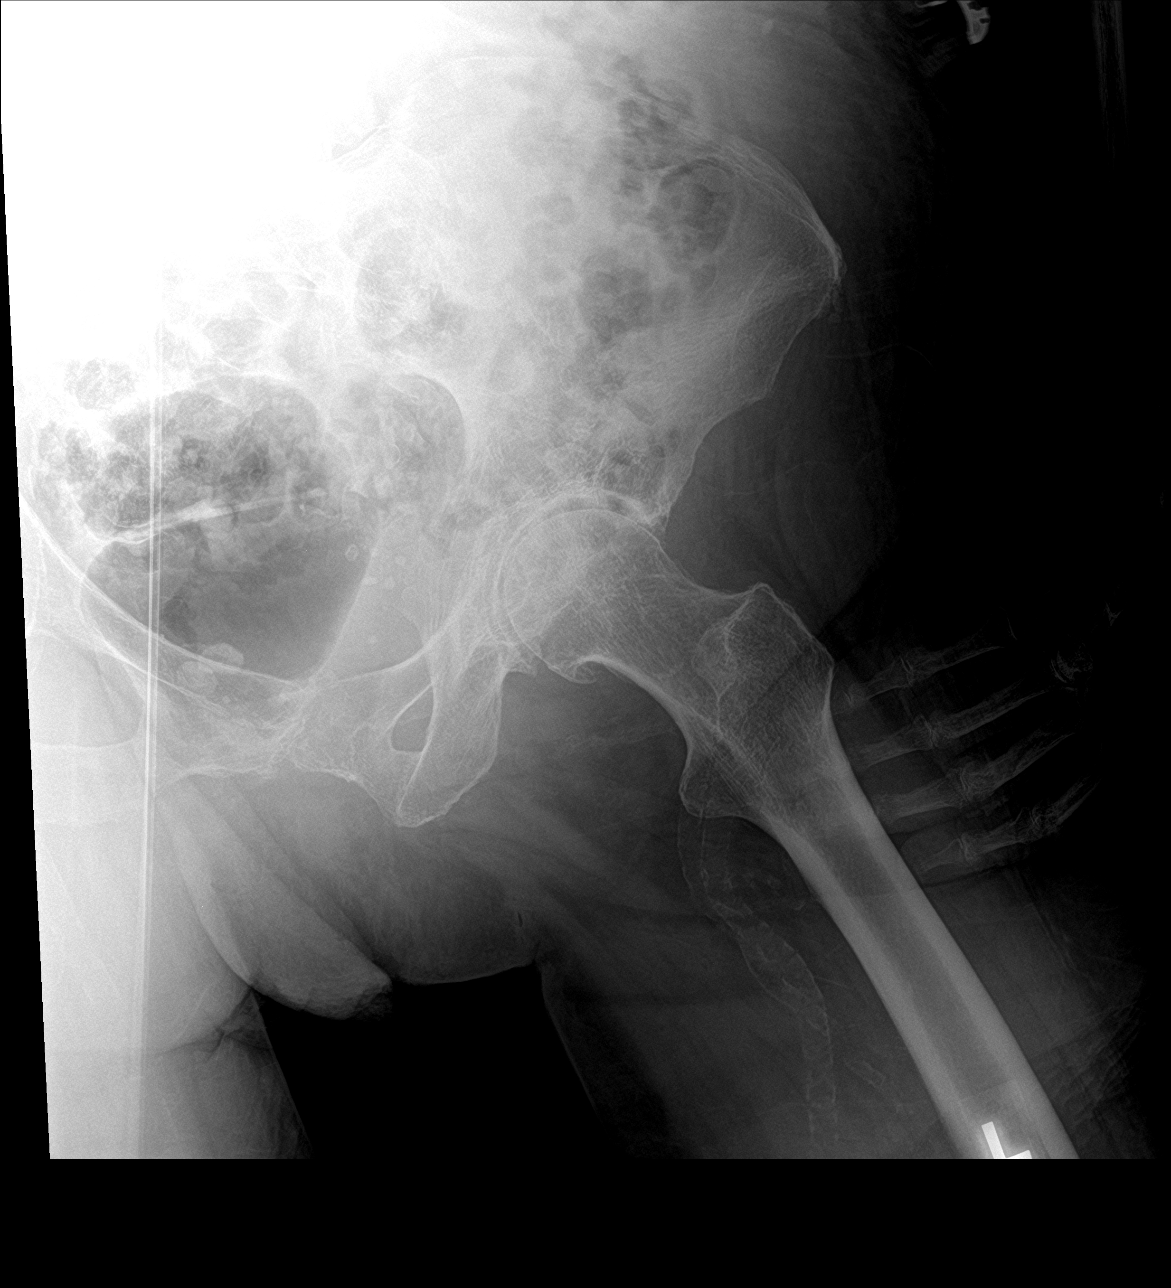

[3 of 3 positions shown; findings below may reference images not displayed]

FINDINGS: There is no evidence of hip fracture or dislocation. Moderate
narrowing and osteophyte formation is seen involving both hips.
IMPRESSION: Moderate degenerative joint disease is noted bilaterally. No acute
abnormality is noted.

## 2023-10-29 IMAGING — DX DG CHEST 1V PORT
1 series · 1 of 1 positions shown · non-contrast
Comparison: 07/15/2021

CLINICAL DATA: Weakness, positive [id number] test

EXAM:
PORTABLE CHEST 1 VIEW

[chest ap]
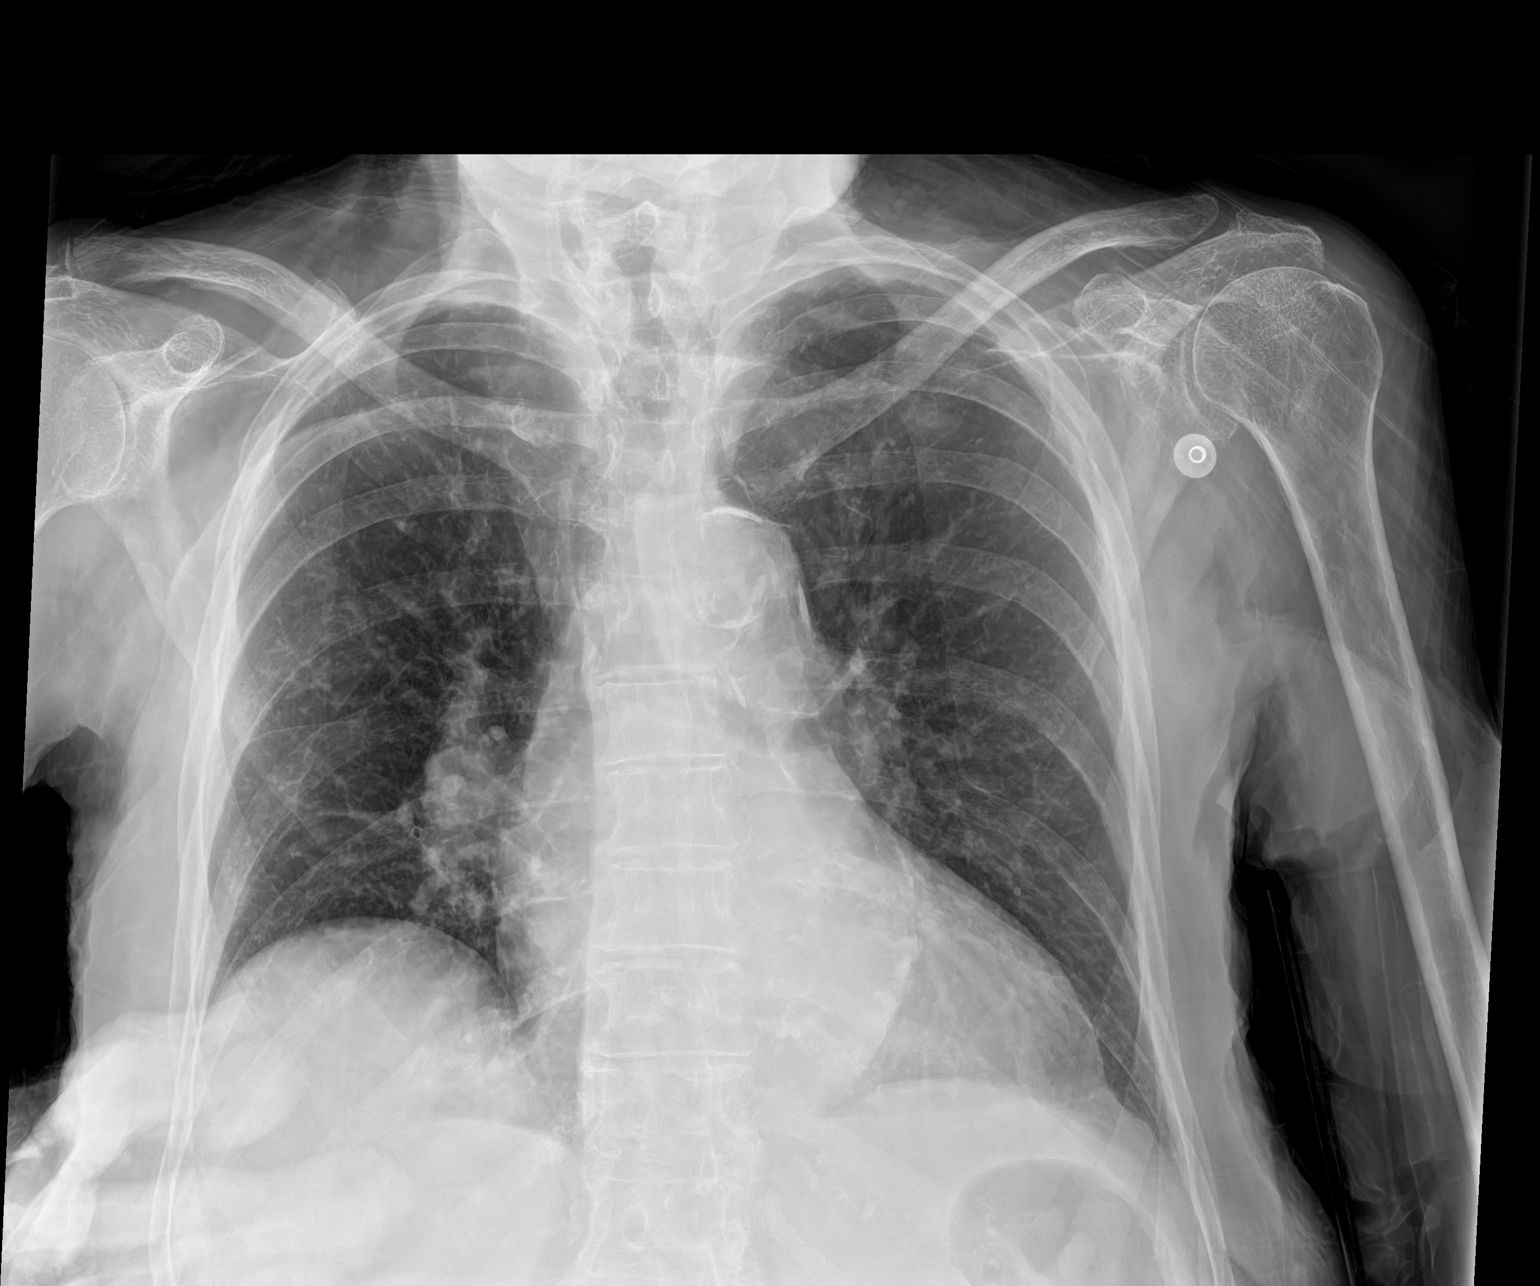

[1 of 1 positions shown; findings below may reference images not displayed]

FINDINGS: Cardiac shadow is mildly enlarged but stable. Aortic calcifications
are noted. Lungs are well aerated bilaterally. No focal infiltrate
or sizable effusion is seen. Elevation of the right hemidiaphragm is
again noted. No acute bony abnormality is seen.
IMPRESSION: No active disease.
